# Patient Record
Sex: Male | Born: 1960 | Race: White | Hispanic: No | Marital: Married | Smoking: Current every day smoker
Health system: Southern US, Community
[De-identification: ages and names within clinical notes are randomized; demographics above are authoritative.]

## PROBLEM LIST (undated history)

## (undated) DIAGNOSIS — J449 Chronic obstructive pulmonary disease, unspecified: Secondary | ICD-10-CM

## (undated) DIAGNOSIS — Z720 Tobacco use: Secondary | ICD-10-CM

## (undated) DIAGNOSIS — G8929 Other chronic pain: Secondary | ICD-10-CM

## (undated) DIAGNOSIS — I10 Essential (primary) hypertension: Secondary | ICD-10-CM

## (undated) DIAGNOSIS — M549 Dorsalgia, unspecified: Secondary | ICD-10-CM

## (undated) DIAGNOSIS — J9383 Other pneumothorax: Secondary | ICD-10-CM

## (undated) DIAGNOSIS — K089 Disorder of teeth and supporting structures, unspecified: Secondary | ICD-10-CM

## (undated) DIAGNOSIS — F1410 Cocaine abuse, uncomplicated: Secondary | ICD-10-CM

## (undated) HISTORY — PX: NO PAST SURGERIES: SHX2092

---

## 2012-03-24 DIAGNOSIS — Z720 Tobacco use: Secondary | ICD-10-CM

## 2012-03-24 HISTORY — DX: Tobacco use: Z72.0

## 2012-09-27 DIAGNOSIS — K089 Disorder of teeth and supporting structures, unspecified: Secondary | ICD-10-CM

## 2012-09-27 HISTORY — DX: Disorder of teeth and supporting structures, unspecified: K08.9

## 2012-11-21 DIAGNOSIS — E785 Hyperlipidemia, unspecified: Secondary | ICD-10-CM | POA: Insufficient documentation

## 2012-11-21 DIAGNOSIS — I10 Essential (primary) hypertension: Secondary | ICD-10-CM | POA: Diagnosis present

## 2013-01-24 DIAGNOSIS — R739 Hyperglycemia, unspecified: Secondary | ICD-10-CM | POA: Insufficient documentation

## 2013-01-27 DIAGNOSIS — J449 Chronic obstructive pulmonary disease, unspecified: Secondary | ICD-10-CM | POA: Diagnosis present

## 2013-09-12 DIAGNOSIS — M545 Low back pain: Secondary | ICD-10-CM

## 2013-09-12 DIAGNOSIS — G8929 Other chronic pain: Secondary | ICD-10-CM | POA: Insufficient documentation

## 2013-09-12 DIAGNOSIS — M5417 Radiculopathy, lumbosacral region: Secondary | ICD-10-CM | POA: Insufficient documentation

## 2014-01-08 ENCOUNTER — Emergency Department (HOSPITAL_COMMUNITY)
Admission: EM | Admit: 2014-01-08 | Discharge: 2014-01-08 | Disposition: A | Discharge status: Home / Self Care | Payer: Medicare PPO | Attending: Emergency Medicine | Admitting: Emergency Medicine

## 2014-01-08 ENCOUNTER — Emergency Department (HOSPITAL_COMMUNITY): Payer: Medicare PPO

## 2014-01-08 ENCOUNTER — Encounter (HOSPITAL_COMMUNITY): Payer: Self-pay | Admitting: Emergency Medicine

## 2014-01-08 DIAGNOSIS — Y9389 Activity, other specified: Secondary | ICD-10-CM | POA: Diagnosis not present

## 2014-01-08 DIAGNOSIS — F17200 Nicotine dependence, unspecified, uncomplicated: Secondary | ICD-10-CM | POA: Diagnosis not present

## 2014-01-08 DIAGNOSIS — S8990XA Unspecified injury of unspecified lower leg, initial encounter: Secondary | ICD-10-CM | POA: Diagnosis present

## 2014-01-08 DIAGNOSIS — Y9289 Other specified places as the place of occurrence of the external cause: Secondary | ICD-10-CM | POA: Insufficient documentation

## 2014-01-08 DIAGNOSIS — I10 Essential (primary) hypertension: Secondary | ICD-10-CM | POA: Diagnosis not present

## 2014-01-08 DIAGNOSIS — X500XXA Overexertion from strenuous movement or load, initial encounter: Secondary | ICD-10-CM | POA: Diagnosis not present

## 2014-01-08 DIAGNOSIS — S99919A Unspecified injury of unspecified ankle, initial encounter: Secondary | ICD-10-CM | POA: Diagnosis present

## 2014-01-08 DIAGNOSIS — S93409A Sprain of unspecified ligament of unspecified ankle, initial encounter: Secondary | ICD-10-CM | POA: Diagnosis not present

## 2014-01-08 DIAGNOSIS — S99929A Unspecified injury of unspecified foot, initial encounter: Secondary | ICD-10-CM

## 2014-01-08 DIAGNOSIS — S93402A Sprain of unspecified ligament of left ankle, initial encounter: Secondary | ICD-10-CM

## 2014-01-08 HISTORY — DX: Essential (primary) hypertension: I10

## 2014-01-08 MED ORDER — OXYCODONE-ACETAMINOPHEN 5-325 MG PO TABS
1.0000 | ORAL_TABLET | Freq: Once | ORAL | Status: AC
  Administered 2014-01-08: 1 via ORAL
  Filled 2014-01-08: qty 1

## 2014-01-08 MED ORDER — IBUPROFEN 600 MG PO TABS: 600.0000 mg | ORAL_TABLET | Freq: Four times a day (QID) | ORAL | Status: DC | PRN

## 2014-01-08 MED ORDER — OXYCODONE-ACETAMINOPHEN 5-325 MG PO TABS: 1.0000 | ORAL_TABLET | Freq: Four times a day (QID) | ORAL | Status: DC | PRN

## 2014-01-08 NOTE — ED Notes (Signed)
Pt twisted his left ankle about 12:30 last night

## 2014-01-08 NOTE — ED Provider Notes (Signed)
CSN: [id number]     Arrival date & time 01/08/14  0346 History   First MD Initiated Contact with Patient 01/08/14 [phone]     Chief Complaint  Patient presents with  . Ankle Pain     (Consider location/radiation/quality/duration/timing/severity/associated sxs/prior Treatment) HPI  This a 53 year old male with history of hypertension who presents with left ankle pain. Patient reports that earlier this evening, he rolled his left ankle when stepping out of his vehicle.  He reports 10 out of 10 pain. He has not taken anything for the pain. He denies any other injury.  Past Medical History  Diagnosis Date  . Hypertension    History reviewed. No pertinent past surgical history. History reviewed. No pertinent family history. History  Substance Use Topics  . Smoking status: Current Every Day Smoker  . Smokeless tobacco: Not on file  . Alcohol Use: No    Review of Systems  Musculoskeletal:       Left ankle pain  Neurological: Negative for weakness and numbness.  All other systems reviewed and are negative.     Allergies  Tylenol  Home Medications   Prior to Admission medications   Medication Sig Start Date End Date Taking? Authorizing Provider  ibuprofen (ADVIL,MOTRIN) 600 MG tablet Take 1 tablet (600 mg total) by mouth every 6 (six) hours as needed. 01/08/14   Merryl Hacker, MD  oxyCODONE-acetaminophen (PERCOCET/ROXICET) 5-325 MG per tablet Take 1 tablet by mouth every 6 (six) hours as needed for severe pain. 01/08/14   Merryl Hacker, MD   BP 168/103  Pulse 85  Temp(Src) 98.2 F (36.8 C) (Oral)  Resp 18  Ht 5\' 7"  (1.702 m)  Wt 180 lb (81.647 kg)  BMI 28.19 kg/m2  SpO2 96% Physical Exam  Nursing note and vitals reviewed. Constitutional: He is oriented to person, place, and time.  Appears older than stated age  HENT:  Head: Normocephalic and atraumatic.  Cardiovascular: Normal rate and regular rhythm.   Pulmonary/Chest: Effort normal. No respiratory distress.   Musculoskeletal: He exhibits no edema.  Focused examination of the left ankle reveals swelling about the ankle with tenderness to palpation over the lateral malleolus, no midfoot tenderness,  no proximal fibular tenderness, 2+ DP pulse  Neurological: He is alert and oriented to person, place, and time.  Skin: Skin is warm and dry.  Psychiatric: He has a normal mood and affect.    ED Course  Procedures (including critical care time) Labs Review Labs Reviewed - No data to display  Imaging Review Dg Ankle Complete Left  01/08/2014   CLINICAL DATA:  Twisting injury to left ankle, with ankle swelling and pain.  EXAM: LEFT ANKLE COMPLETE - 3+ VIEW  COMPARISON:  None.  FINDINGS: There is no evidence of fracture or dislocation. The ankle mortise is intact; the interosseous space is within normal limits. No talar tilt or subluxation is seen. A small posterior calcaneal spur is seen.  The joint spaces are preserved. Lateral soft tissue swelling is noted.  IMPRESSION: No evidence of fracture or dislocation.   Electronically Signed   By: Garald Balding M.D.   On: 01/08/2014 04:25     EKG Interpretation None      MDM   Final diagnoses:  Ankle sprain, left, initial encounter    Patient presents with left ankle pain. He was given Percocet for pain management. Ankle films negative for fracture or dislocation. Discuss with patient and his family rest, ice, compression, and elevation. He will be  discharged home with pain medication and was encouraged to take ibuprofen as well.  After history, exam, and medical workup I feel the patient has been appropriately medically screened and is safe for discharge home. Pertinent diagnoses were discussed with the patient. Patient was given return precautions.     Merryl Hacker, MD 01/08/14 [phone]

## 2014-01-08 NOTE — Discharge Instructions (Signed)

## 2014-04-23 ENCOUNTER — Emergency Department (HOSPITAL_COMMUNITY)
Admission: EM | Admit: 2014-04-23 | Discharge: 2014-04-23 | Disposition: A | Discharge status: Home / Self Care | Payer: Medicare PPO | Attending: Emergency Medicine | Admitting: Emergency Medicine

## 2014-04-23 ENCOUNTER — Emergency Department (HOSPITAL_COMMUNITY): Payer: Medicare PPO

## 2014-04-23 ENCOUNTER — Encounter (HOSPITAL_COMMUNITY): Payer: Self-pay | Admitting: Emergency Medicine

## 2014-04-23 DIAGNOSIS — I10 Essential (primary) hypertension: Secondary | ICD-10-CM | POA: Diagnosis not present

## 2014-04-23 DIAGNOSIS — J449 Chronic obstructive pulmonary disease, unspecified: Secondary | ICD-10-CM | POA: Diagnosis not present

## 2014-04-23 DIAGNOSIS — Z720 Tobacco use: Secondary | ICD-10-CM | POA: Insufficient documentation

## 2014-04-23 DIAGNOSIS — G8929 Other chronic pain: Secondary | ICD-10-CM | POA: Insufficient documentation

## 2014-04-23 DIAGNOSIS — R52 Pain, unspecified: Secondary | ICD-10-CM

## 2014-04-23 DIAGNOSIS — M549 Dorsalgia, unspecified: Secondary | ICD-10-CM

## 2014-04-23 DIAGNOSIS — M545 Low back pain: Secondary | ICD-10-CM | POA: Diagnosis present

## 2014-04-23 HISTORY — DX: Chronic obstructive pulmonary disease, unspecified: J44.9

## 2014-04-23 HISTORY — DX: Dorsalgia, unspecified: M54.9

## 2014-04-23 HISTORY — DX: Other chronic pain: G89.29

## 2014-04-23 NOTE — ED Notes (Signed)
Pt states he has a hx of back pain that has worsened since last night. Alert and oriented.

## 2014-04-23 NOTE — Discharge Instructions (Signed)
You were evaluated in the ED today for your back pain. There were no acute findings that suggest an emergent cause for your back pain. You will need to follow-up with your primary care provider for further evaluation and management of your chronic back pain. Please return to ED if you begin to experience numbness, weakness, loss of bowel or bladder function

## 2014-04-23 NOTE — ED Provider Notes (Signed)
CSN: [id number]     Arrival date & time 04/23/14  1632 History  This chart was scribed for non-physician practitioner, Comer Locket, PA-C working with Artis Delay, MD by Frederich Balding, ED scribe. This patient was seen in room WTR8/WTR8 and the patient's care was started at 5:03 PM.    Chief Complaint  Patient presents with  . Back Pain   The history is provided by the patient. No language interpreter was used.    HPI Comments: Benjamin Burke is a 53 y.o. male who presents to the Emergency Department complaining of chronic lower back pain that worsened last night. Describes the pain as stabbing and constant. Denies injury but states he did heavy lifting a few days before pain started. Ambulation and standing still worsens the pain. Pain is not as severe at rest and is only exacerbated with walking and standing. Pt see pain management and has taken his daily percocet with no relief. He has also used a back brace with no relief. Denies abdominal pain, bowel or bladder incontinence, numbness or weakness. Denies history of cancer or IV drug use.   Past Medical History  Diagnosis Date  . Hypertension   . COPD (chronic obstructive pulmonary disease)   . Chronic back pain    History reviewed. No pertinent past surgical history. History reviewed. No pertinent family history. History  Substance Use Topics  . Smoking status: Current Every Day Smoker  . Smokeless tobacco: Not on file  . Alcohol Use: No    Review of Systems  Gastrointestinal: Negative for abdominal pain.  Genitourinary:       Negative for bowel or bladder incontinence.  Musculoskeletal: Positive for back pain.  Neurological: Negative for weakness and numbness.  All other systems reviewed and are negative.  Allergies  Tylenol  Home Medications   Prior to Admission medications   Medication Sig Start Date End Date Taking? Authorizing Provider  ibuprofen (ADVIL,MOTRIN) 600 MG tablet Take 1 tablet (600 mg total) by  mouth every 6 (six) hours as needed. 01/08/14   Merryl Hacker, MD  oxyCODONE-acetaminophen (PERCOCET/ROXICET) 5-325 MG per tablet Take 1 tablet by mouth every 6 (six) hours as needed for severe pain. 01/08/14   Merryl Hacker, MD   BP 162/95 mmHg  Pulse 79  Temp(Src) 97.5 F (36.4 C) (Oral)  SpO2 97%   Physical Exam  Constitutional: He is oriented to person, place, and time. He appears well-developed and well-nourished. No distress.  HENT:  Head: Normocephalic and atraumatic.  Eyes: Conjunctivae and EOM are normal.  Neck: Neck supple. No tracheal deviation present.  Cardiovascular: Normal rate, regular rhythm and normal heart sounds.   Pulmonary/Chest: Effort normal and breath sounds normal. No respiratory distress. He has no wheezes. He has no rales.  Abdominal: Soft. There is no tenderness.  Musculoskeletal: Normal range of motion.  Pain is reproducible with walking. No midline body tenderness. No other obvious deformities appreciated. No skin lesions or eruptions.   Neurological: He is alert and oriented to person, place, and time.  Gait appears baseline without ataxia but is somewhat antalgic. Neurovascularly intact.  Skin: Skin is warm and dry.  Psychiatric: He has a normal mood and affect. His behavior is normal.  Nursing note and vitals reviewed.   ED Course  Procedures (including critical care time)  DIAGNOSTIC STUDIES: Oxygen Saturation is 97% on RA, normal by my interpretation.    COORDINATION OF CARE: 5:07 PM-Discussed treatment plan which includes lumbar xray with pt at bedside  and pt agreed to plan.   Labs Review Labs Reviewed - No data to display  Imaging Review Dg Lumbar Spine 2-3 Views  04/23/2014   CLINICAL DATA:  Chronic low back pain. Left-sided pain for 2 days. No injury. Initial encounter.  EXAM: LUMBAR SPINE - 2-3 VIEW  COMPARISON:  None.  FINDINGS: Five lumbar type vertebral bodies. Sacroiliac joints are symmetric. Maintenance of vertebral body  height. Mild straightening of expected lordosis. Mild endplate osteophytes, including at L3-4. Intervertebral disc heights are maintained. Aortic atherosclerosis.  IMPRESSION: No acute osseous abnormality. Nonspecific straightening of expected lumbar lordosis.  Atherosclerosis.   Electronically Signed   By: Abigail Miyamoto M.D.   On: 04/23/2014 17:52   Dg Pelvis 1-2 Views  04/23/2014   CLINICAL DATA:  Chronic low back pain. LEFT-sided low back pain for 1 and half days. Initial encounter.  EXAM: PELVIS - 1-2 VIEW  COMPARISON:  None.  FINDINGS: There is no evidence of pelvic fracture or diastasis. No pelvic bone lesions are seen.  IMPRESSION: Negative.   Electronically Signed   By: Dereck Ligas M.D.   On: 04/23/2014 17:51     EKG Interpretation None      MDM  Vitals stable - WNL -afebrile Pt resting comfortably in ED. PE--not concerning further acute or emergent pathology. Patient ambulates independently without any appreciable ataxia. This appears to be chronic back pain that he is already followed by his primary care as well as pain management. There are no acute objective findings during today's visit. Patient denies any numbness, weakness, loss of bowel or bladder or any other sign of cauda equina or other emergent pathology. Patient denies discomfort at rest, pain exacerbated with walking, likely musculoskeletal. Imaging--x-ray of lumbar and pelvis show no acute osseous abnormalities.  Discussed f/u with PCP and return precautions, pt very amenable to plan. Patient stable, in good condition and is appropriate for discharge   Final diagnoses:  Back pain      I personally performed the services described in this documentation, which was scribed in my presence. The recorded information has been reviewed and is accurate.  Viona Gilmore Ripley, PA-C 04/24/14 [place], MD 04/25/14 [phone]

## 2014-04-24 ENCOUNTER — Emergency Department (HOSPITAL_COMMUNITY)
Admission: EM | Admit: 2014-04-24 | Discharge: 2014-04-24 | Disposition: A | Discharge status: Home / Self Care | Payer: Medicare PPO | Attending: Emergency Medicine | Admitting: Emergency Medicine

## 2014-04-24 ENCOUNTER — Encounter (HOSPITAL_COMMUNITY): Payer: Self-pay | Admitting: Emergency Medicine

## 2014-04-24 DIAGNOSIS — Z720 Tobacco use: Secondary | ICD-10-CM | POA: Diagnosis not present

## 2014-04-24 DIAGNOSIS — J449 Chronic obstructive pulmonary disease, unspecified: Secondary | ICD-10-CM | POA: Diagnosis not present

## 2014-04-24 DIAGNOSIS — G8929 Other chronic pain: Secondary | ICD-10-CM | POA: Diagnosis not present

## 2014-04-24 DIAGNOSIS — I10 Essential (primary) hypertension: Secondary | ICD-10-CM | POA: Insufficient documentation

## 2014-04-24 DIAGNOSIS — Z79899 Other long term (current) drug therapy: Secondary | ICD-10-CM | POA: Insufficient documentation

## 2014-04-24 DIAGNOSIS — S39012D Strain of muscle, fascia and tendon of lower back, subsequent encounter: Secondary | ICD-10-CM | POA: Diagnosis not present

## 2014-04-24 DIAGNOSIS — X58XXXD Exposure to other specified factors, subsequent encounter: Secondary | ICD-10-CM | POA: Diagnosis not present

## 2014-04-24 DIAGNOSIS — M545 Low back pain: Secondary | ICD-10-CM | POA: Diagnosis present

## 2014-04-24 MED ORDER — PREDNISONE 50 MG PO TABS: 50.0000 mg | ORAL_TABLET | Freq: Every day | ORAL | Status: DC

## 2014-04-24 MED ORDER — HYDROCODONE-IBUPROFEN 7.5-200 MG PO TABS: 1.0000 | ORAL_TABLET | Freq: Three times a day (TID) | ORAL | Status: DC | PRN

## 2014-04-24 MED ORDER — KETOROLAC TROMETHAMINE 60 MG/2ML IM SOLN
60.0000 mg | Freq: Once | INTRAMUSCULAR | Status: AC
  Administered 2014-04-24: 60 mg via INTRAMUSCULAR
  Filled 2014-04-24: qty 2

## 2014-04-24 MED ORDER — DEXAMETHASONE SODIUM PHOSPHATE 10 MG/ML IJ SOLN
10.0000 mg | Freq: Once | INTRAMUSCULAR | Status: AC
  Administered 2014-04-24: 10 mg via INTRAMUSCULAR
  Filled 2014-04-24: qty 1

## 2014-04-24 MED ORDER — HYDROMORPHONE HCL 1 MG/ML IJ SOLN
1.0000 mg | Freq: Once | INTRAMUSCULAR | Status: DC
Start: 2014-04-24 — End: 2014-04-24
  Filled 2014-04-24: qty 1

## 2014-04-24 NOTE — ED Notes (Signed)
Pt alert, arrives via EMS, c/o cont low back pain, seen in ED yesterday with similar c/o, resp even unlabored, skin pwd, ambulates to triage

## 2014-04-24 NOTE — Discharge Instructions (Signed)
Follow up with your primary care doctor. Return here as needed. Use ice and heat on the area.

## 2014-04-24 NOTE — ED Provider Notes (Signed)
CSN: [id number]     Arrival date & time 04/24/14  1346 History   First MD Initiated Contact with Patient 04/24/14 1502     Chief Complaint  Patient presents with  . Back Pain     HPI: Benjamin Burke is a 53 year old Caucasian male with PMH of HTN, HLD, and Chronic Back Pain who presents to the ED on 04/24/2014 with worsening left lower back pain since Sunday evening. He denies lifting any heavy objects recently and says he is unsure of what caused the exacerbation of his pain. He says the pain is "sharp" in nature, and is a 9/10 with standing or walking and a 2-3/10 when he is lying down. On Sunday, he took his prescribed daily Percocet and did not get any relief in his pain, so he took another dose 1 hour later and says the pain still did not subside. He has tried ice as well, but says that does not provide any relief.  Patient reports he was evaluated in the ED yesterday, but his pain persisted once he was home. Lumbar Spine and Pelvic X-rays performed on 04/23/2014 were negative for any acute findings. The patient has not been evaluated by his PCP since the pain started on Sunday, due to not being able to drive due to the pain.   Past Medical History  Diagnosis Date  . Hypertension   . COPD (chronic obstructive pulmonary disease)   . Chronic back pain    History reviewed. No pertinent past surgical history. No family history on file. History  Substance Use Topics  . Smoking status: Current Every Day Smoker  . Smokeless tobacco: Not on file  . Alcohol Use: No    Review of Systems  Constitutional: Negative for fever and chills.  Eyes: Negative for visual disturbance.  Respiratory: Negative for chest tightness and shortness of breath.   Cardiovascular: Negative for chest pain.  Gastrointestinal: Negative for nausea and vomiting.  Endocrine: Negative for polyuria.  Genitourinary: Negative for frequency and flank pain.  Musculoskeletal: Positive for back pain. Negative for gait  problem, neck pain and neck stiffness.  Skin: Negative for rash.  Allergic/Immunologic: Negative for immunocompromised state.  Neurological: Negative for tremors, syncope, weakness and numbness.  Psychiatric/Behavioral: Negative for confusion.  : All other systems negative except as documented in the HPI. All pertinent positives and negatives as reviewed in the HPI.   Allergies  Tylenol  Home Medications   Prior to Admission medications   Medication Sig Start Date End Date Taking? Authorizing Provider  lisinopril (PRINIVIL,ZESTRIL) 10 MG tablet Take 10 mg by mouth daily.   Yes Historical Provider, MD  oxyCODONE-acetaminophen (PERCOCET/ROXICET) 5-325 MG per tablet Take 1 tablet by mouth every 6 (six) hours as needed for severe pain. 01/08/14  Yes Merryl Hacker, MD  ibuprofen (ADVIL,MOTRIN) 600 MG tablet Take 1 tablet (600 mg total) by mouth every 6 (six) hours as needed. 01/08/14   Merryl Hacker, MD   BP 147/116 mmHg  Pulse 70  Temp(Src) 98 F (36.7 C) (Oral)  Resp 16  Wt 180 lb (81.647 kg)  SpO2 99% Physical Exam  Constitutional: He is oriented to person, place, and time. He appears well-developed and well-nourished. No distress.  HENT:  Head: Normocephalic and atraumatic.  Eyes: Conjunctivae are normal. Pupils are equal, round, and reactive to light.  Neck: Normal range of motion. Neck supple. No tracheal deviation present.  Cardiovascular: Normal rate, regular rhythm, normal heart sounds and intact distal pulses.  Exam reveals no gallop and no friction rub.   No murmur heard. Pulmonary/Chest: Effort normal and breath sounds normal. No stridor. No respiratory distress. He has no wheezes. He has no rales. He exhibits no tenderness.  Abdominal: Soft. Bowel sounds are normal. He exhibits no distension and no mass. There is no tenderness. There is no rebound and no guarding.  Musculoskeletal: Normal range of motion. He exhibits tenderness. He exhibits no edema.  Small area  along left lower back that is tender to deep palpation. Limited extension of lumbar spine, with normal flexion. Normal flexion and extension of cervical spine. Negative Straight Leg Raise bilaterally.  Lymphadenopathy:    He has no cervical adenopathy.  Neurological: He is alert and oriented to person, place, and time. He has normal reflexes. He displays normal reflexes. He exhibits normal muscle tone. Coordination normal.  Muscular strength of lower extremities 5/5 bilaterally. Normal gait.  Skin: Skin is warm and dry. No rash noted. He is not diaphoretic. No erythema. No pallor.  Nursing note and vitals reviewed.   ED Course  Procedures (including critical care time) Labs Review Labs Reviewed - No data to display  Imaging Review Dg Lumbar Spine 2-3 Views  04/23/2014   CLINICAL DATA:  Chronic low back pain. Left-sided pain for 2 days. No injury. Initial encounter.  EXAM: LUMBAR SPINE - 2-3 VIEW  COMPARISON:  None.  FINDINGS: Five lumbar type vertebral bodies. Sacroiliac joints are symmetric. Maintenance of vertebral body height. Mild straightening of expected lordosis. Mild endplate osteophytes, including at L3-4. Intervertebral disc heights are maintained. Aortic atherosclerosis.  IMPRESSION: No acute osseous abnormality. Nonspecific straightening of expected lumbar lordosis.  Atherosclerosis.   Electronically Signed   By: Abigail Miyamoto M.D.   On: 04/23/2014 17:52   Dg Pelvis 1-2 Views  04/23/2014   CLINICAL DATA:  Chronic low back pain. LEFT-sided low back pain for 1 and half days. Initial encounter.  EXAM: PELVIS - 1-2 VIEW  COMPARISON:  None.  FINDINGS: There is no evidence of pelvic fracture or diastasis. No pelvic bone lesions are seen.  IMPRESSION: Negative.   Electronically Signed   By: Dereck Ligas M.D.   On: 04/23/2014 17:51    The patient has pain over the L SI joint laterally. There is no midline tenderness. The patient was seen and evaluated with x-rays yesterday. The patient  has no urinary symptoms with normal reflexes and motor function in his lower extremities. The patient was also evaluated by Dr. Sabra Heck. The patient is advised to return here as needed. He is also advised to use ice and heat on his lower lateral L back. The patient agrees to the plan and all questions were answered.     Brent General, PA-C 04/24/14 1546  Johnna Acosta, MD 04/25/14 [phone]

## 2014-04-24 NOTE — ED Provider Notes (Signed)
53 year old male with several days of pain in his left SI joint area, this is mild when he is resting, worse when he stands or changes position and is not associated with any pathologic red flags for back pain. On exam the patient has reproducible tenderness directly over that area, there is pain with standing up, pain is completely relieved with lying in a supine position on his side. No radiculopathy, medications will be given, the patient will be discharged with NSAIDs to follow-up with his primary doctor, he is aware of the plan and is in agreement.  Medical screening examination/treatment/procedure(s) were conducted as a shared visit with non-physician practitioner(s) and myself.  I personally evaluated the patient during the encounter.  Clinical Impression:   Final diagnoses:  Lumbar strain, subsequent encounter         Johnna Acosta, MD 04/25/14 2355

## 2014-11-28 ENCOUNTER — Encounter: Payer: Self-pay | Admitting: Anesthesiology

## 2014-11-28 ENCOUNTER — Ambulatory Visit: Payer: Medicare PPO | Attending: Anesthesiology | Admitting: Anesthesiology

## 2014-11-28 VITALS — BP 155/101 | HR 78 | Temp 97.8°F | Resp 18 | Ht 66.0 in | Wt 169.0 lb

## 2014-11-28 DIAGNOSIS — F17200 Nicotine dependence, unspecified, uncomplicated: Secondary | ICD-10-CM | POA: Insufficient documentation

## 2014-11-28 DIAGNOSIS — G8929 Other chronic pain: Secondary | ICD-10-CM | POA: Diagnosis present

## 2014-11-28 DIAGNOSIS — M545 Low back pain, unspecified: Secondary | ICD-10-CM

## 2014-11-28 DIAGNOSIS — M47816 Spondylosis without myelopathy or radiculopathy, lumbar region: Secondary | ICD-10-CM

## 2014-11-28 DIAGNOSIS — I10 Essential (primary) hypertension: Secondary | ICD-10-CM | POA: Insufficient documentation

## 2014-11-28 DIAGNOSIS — M5136 Other intervertebral disc degeneration, lumbar region: Secondary | ICD-10-CM | POA: Insufficient documentation

## 2014-11-28 DIAGNOSIS — M5137 Other intervertebral disc degeneration, lumbosacral region: Secondary | ICD-10-CM

## 2014-11-28 MED ORDER — TRAMADOL HCL 50 MG PO TABS: 50.0000 mg | ORAL_TABLET | Freq: Three times a day (TID) | ORAL | Status: DC

## 2014-11-28 NOTE — Progress Notes (Signed)
Safety precautions to be maintained throughout the outpatient stay will include: orient to surroundings, keep bed in low position, maintain call bell within reach at all times, provide assistance with transfer out of bed and ambulation.  

## 2014-11-28 NOTE — Progress Notes (Signed)
Subjective:    Patient ID: Benjamin Burke, male    DOB: [birth year], 54 y.o.   MRN: [id number]  HPI  This patient is a 54 year old white male who presents to [place] for chronic low back pain. Vision indicates that he has had chronic low back pain for the past 4-5 years. He indicated that he fell on ice several years ago and his pain has been quite severe since that time. His subjective pain intensity rating is 7 on a scale of 0-10. Vision indicated that he was treated in another clinic and was given oxycodone which he took 4 tablets a day.  Patient indicated that the medications help him but that he has been off medications for at least 3 months. He also indicates that he is quite active in that he looks after his 66 year old mother and his uncle. He indicates also that he is busy keeping up with his 32 year old son. He indicated that many years ago he has had some injections to his back but they did not help him at all.. Current medications: Patient indicates that he takes antihypertensive medications but he could not remember the name of those medicines and he also takes aspirin 81 mg.  Allergies he is allergic to Tylenol and Tylenol makes him develop nausea and vomiting  Past medical history: Past medical history is positive for hypertension for which he takes medications.  Past surgical history: Negative patient has not had any surgery in the past.  Social and economic history. The patient indicates that he is not married but that he has 2 sons ages 80 and 53 and they are both alive and well. Patient indicates that he smokes one half packs of cigarettes per day and he's been doing this since age 31 He does not use alcohol and he does not use illicit drugs  Legal history: Patient indicates that he is never had any difficulties with the law.  Family history. As indicates that his mother is alive and well and that she is 60. Case that she has diabetes hypertension and takes multiple  medications. His father is deceased at age 18 years old and he died from cancer of the kidney and bladder. He has 3 brothers who are alive and well and 2 sisters who are both alive and well.   Review of Systems  Constitutional: Negative.   HENT: Negative.   Eyes: Negative.   Respiratory: Negative.   Cardiovascular: Negative.   Gastrointestinal: Negative.   Endocrine: Negative.   Genitourinary: Negative.   Allergic/Immunologic:       Patient is allergic to Tylenol  Neurological: Negative.   Hematological: Negative.   Psychiatric/Behavioral: Negative.        Objective:   Physical Exam  Constitutional: He is oriented to person, place, and time. He appears well-developed.  HENT:  Head: Normocephalic.  Eyes: Lids are normal. Pupils are equal, round, and reactive to light. Lids are everted and swept, no foreign bodies found.  Neck: Normal range of motion.  Cardiovascular: Normal rate, regular rhythm, normal heart sounds and intact distal pulses.   Pulmonary/Chest: Effort normal.  Abdominal: Soft. Normal appearance and bowel sounds are normal.  Musculoskeletal: Normal range of motion.       Lumbar back: He exhibits tenderness.       Back:  Lymphadenopathy:  There was no lymphadenopathy  Neurological: He is alert and oriented to person, place, and time. He has normal reflexes. He displays normal reflexes. No cranial nerve deficit or sensory deficit.  He exhibits normal muscle tone. Coordination normal.  Skin: Skin is warm, dry and intact. No cyanosis. Nails show no clubbing.  Psychiatric: He has a normal mood and affect. His speech is normal and behavior is normal. Thought content normal.    This patient is a pleasant delightful 54 year old gentleman who presents with chronic low back pain. He looks older than his stated age He walks without a limp and does not appear to be in any distress. His blood pressure is 155/101 and his pulse is 78 bpm. He is afebrile and his weight is  169 pounds Appears to be in no distress. Head and neck appears normal but he has poor dentition. There is no neck stiffness. Pupils are equal and regular and reacts normally to light. Chest is clinically clear and there are no adventitious sounds Abdomen is soft and nontender and there are no palpable organomegaly Reflexes weren't normal Reacting to light touch and pinprick were normal Straight-leg raising test was abnormal On the left side it was 40 and on the right-sided was 50 Torsion test was positive indicative of facetogenic disease There was tenderness in the low back area particularly in the region of L4 on L5 There were no other positive physical findings.      Assessment & Plan:  1) Chronic Low Back pain 2) Lumbar DDD 3) Lumbar Facet Arthropathy 4) S/P Hypertension.  Plan of management 1 Will begin the patient on tramadol 50 mg 3 times a day after meals and give him a three-week supply with 2 refills 2 plan to perform a caudal epidural steroid injection for him in the future 3 Will ask him to return for follow-up evaluation in 3 weeks. 4. I have asked the patient to follow up with his primary care physician so that his blood pressure can be better controlled I spent some time discussing with the patient the need to refrain from using opioids at this time I suggested to him that I would initially treated him with tramadol and will bring him back after his July 4 for patient to perform a caudal epidural steroid injection for him He appeared to accept and understand this plan of action. Plan to see him in the next 3 weeks.

## 2014-12-19 ENCOUNTER — Ambulatory Visit: Payer: Medicare PPO | Admitting: Anesthesiology

## 2015-07-03 DIAGNOSIS — Z139 Encounter for screening, unspecified: Secondary | ICD-10-CM

## 2016-01-14 NOTE — Congregational Nurse Program (Signed)
Congregational Nurse Program Note  Date of Encounter: 07/03/2015  Past Medical History: Past Medical History:  Diagnosis Date  . Chronic back pain   . COPD (chronic obstructive pulmonary disease)   . Hypertension     Encounter Details:  Client was seen at the [facility] seeking . assistance for an eye exam/glasses.  . Client was assisted with an appointment with the [facility]. Instructed client to monitor BP and make an appointment with PCP. Educated client importance of having regular checkups with PCP to prevent health Issues.  Benjamin Burke.

## 2016-01-21 NOTE — Congregational Nurse Program (Signed)
Congregational Nurse Program Note  Date of Encounter: 07/03/2015  Past Medical History: Past Medical History:  Diagnosis Date  . Chronic back pain   . COPD (chronic obstructive pulmonary disease)   . Hypertension     Encounter Details:  Client was assisted with appointment through the [facility] for an eye exam/glasses. Client has a primary physician in another county and was advised to make appointment as soon as possible to assess hypertension, abnormal bp today. Informed client importance of regular checkups to monitor medical issues.  Tarri Fuller CNP  [phone].

## 2016-04-28 DIAGNOSIS — F17210 Nicotine dependence, cigarettes, uncomplicated: Secondary | ICD-10-CM | POA: Diagnosis present

## 2016-08-07 ENCOUNTER — Encounter (HOSPITAL_COMMUNITY): Payer: Self-pay

## 2016-08-07 ENCOUNTER — Inpatient Hospital Stay (HOSPITAL_COMMUNITY)
Admission: EM | Admit: 2016-08-07 | Discharge: 2016-08-14 | DRG: 201 | Disposition: A | Discharge status: Home / Self Care | Payer: Medicare HMO | Attending: Thoracic Surgery (Cardiothoracic Vascular Surgery) | Admitting: Thoracic Surgery (Cardiothoracic Vascular Surgery)

## 2016-08-07 ENCOUNTER — Inpatient Hospital Stay (HOSPITAL_COMMUNITY): Payer: Medicare HMO

## 2016-08-07 ENCOUNTER — Emergency Department (HOSPITAL_COMMUNITY): Payer: Medicare HMO

## 2016-08-07 DIAGNOSIS — M545 Low back pain: Secondary | ICD-10-CM | POA: Diagnosis present

## 2016-08-07 DIAGNOSIS — Z7982 Long term (current) use of aspirin: Secondary | ICD-10-CM

## 2016-08-07 DIAGNOSIS — F17210 Nicotine dependence, cigarettes, uncomplicated: Secondary | ICD-10-CM | POA: Diagnosis present

## 2016-08-07 DIAGNOSIS — J939 Pneumothorax, unspecified: Secondary | ICD-10-CM

## 2016-08-07 DIAGNOSIS — J9311 Primary spontaneous pneumothorax: Principal | ICD-10-CM | POA: Diagnosis present

## 2016-08-07 DIAGNOSIS — J410 Simple chronic bronchitis: Secondary | ICD-10-CM | POA: Diagnosis not present

## 2016-08-07 DIAGNOSIS — J209 Acute bronchitis, unspecified: Secondary | ICD-10-CM | POA: Diagnosis present

## 2016-08-07 DIAGNOSIS — F1410 Cocaine abuse, uncomplicated: Secondary | ICD-10-CM | POA: Diagnosis present

## 2016-08-07 DIAGNOSIS — J449 Chronic obstructive pulmonary disease, unspecified: Secondary | ICD-10-CM | POA: Diagnosis present

## 2016-08-07 DIAGNOSIS — Z9689 Presence of other specified functional implants: Secondary | ICD-10-CM

## 2016-08-07 DIAGNOSIS — E785 Hyperlipidemia, unspecified: Secondary | ICD-10-CM | POA: Diagnosis present

## 2016-08-07 DIAGNOSIS — Z720 Tobacco use: Secondary | ICD-10-CM | POA: Diagnosis present

## 2016-08-07 DIAGNOSIS — J9811 Atelectasis: Secondary | ICD-10-CM

## 2016-08-07 DIAGNOSIS — J9383 Other pneumothorax: Secondary | ICD-10-CM | POA: Diagnosis not present

## 2016-08-07 DIAGNOSIS — Z9119 Patient's noncompliance with other medical treatment and regimen: Secondary | ICD-10-CM | POA: Diagnosis not present

## 2016-08-07 DIAGNOSIS — I10 Essential (primary) hypertension: Secondary | ICD-10-CM | POA: Diagnosis present

## 2016-08-07 DIAGNOSIS — G8929 Other chronic pain: Secondary | ICD-10-CM | POA: Diagnosis present

## 2016-08-07 DIAGNOSIS — J40 Bronchitis, not specified as acute or chronic: Secondary | ICD-10-CM | POA: Diagnosis present

## 2016-08-07 DIAGNOSIS — Z886 Allergy status to analgesic agent status: Secondary | ICD-10-CM | POA: Diagnosis not present

## 2016-08-07 DIAGNOSIS — J95811 Postprocedural pneumothorax: Secondary | ICD-10-CM | POA: Diagnosis not present

## 2016-08-07 DIAGNOSIS — J439 Emphysema, unspecified: Secondary | ICD-10-CM | POA: Diagnosis present

## 2016-08-07 HISTORY — DX: Cocaine abuse, uncomplicated: F14.10

## 2016-08-07 HISTORY — DX: Tobacco use: Z72.0

## 2016-08-07 HISTORY — DX: Disorder of teeth and supporting structures, unspecified: K08.9

## 2016-08-07 HISTORY — DX: Other pneumothorax: J93.83

## 2016-08-07 LAB — BASIC METABOLIC PANEL
Anion gap: 10 (ref 5–15)
BUN: 7 mg/dL (ref 6–20)
CO2: 27 mmol/L (ref 22–32)
Calcium: 9.4 mg/dL (ref 8.9–10.3)
Chloride: 101 mmol/L (ref 101–111)
Creatinine, Ser: 1.01 mg/dL (ref 0.61–1.24)
GFR calc Af Amer: >60 mL/min (ref >60)
GFR calc non Af Amer: >60 mL/min (ref >60)
Glucose, Bld: 124 mg/dL — ABNORMAL HIGH (ref 65–99)
Potassium: 3.2 mmol/L — ABNORMAL LOW (ref 3.5–5.1)
Sodium: 138 mmol/L (ref 135–145)

## 2016-08-07 LAB — CBC
HCT: 45.2 % (ref 39.0–52.0)
Hemoglobin: 15.5 g/dL (ref 13.0–17.0)
MCH: 30.9 pg (ref 26.0–34.0)
MCHC: 34.3 g/dL (ref 30.0–36.0)
MCV: 90.2 fL (ref 78.0–100.0)
Platelets: 352 10*3/uL (ref 150–400)
RBC: 5.01 MIL/uL (ref 4.22–5.81)
RDW: 13.9 % (ref 11.5–15.5)
WBC: 12.1 10*3/uL — ABNORMAL HIGH (ref 4.0–10.5)

## 2016-08-07 LAB — I-STAT TROPONIN, ED: Troponin i, poc: 0 ng/mL (ref 0.00–0.08)

## 2016-08-07 MED ORDER — GUAIFENESIN ER 600 MG PO TB12
1200.0000 mg | ORAL_TABLET | Freq: Two times a day (BID) | ORAL | Status: DC
  Administered 2016-08-07 – 2016-08-14 (×14): 1200 mg via ORAL
  Filled 2016-08-07 (×14): qty 2

## 2016-08-07 MED ORDER — LIDOCAINE HCL (PF) 1 % IJ SOLN
INTRAMUSCULAR | Status: AC
  Administered 2016-08-07: 30 mL
  Filled 2016-08-07: qty 30

## 2016-08-07 MED ORDER — OXYCODONE-ACETAMINOPHEN 5-325 MG PO TABS: 2.0000 | ORAL_TABLET | Freq: Four times a day (QID) | ORAL | Status: DC | PRN

## 2016-08-07 MED ORDER — MIDAZOLAM HCL 2 MG/2ML IJ SOLN
INTRAMUSCULAR | Status: AC
  Filled 2016-08-07: qty 2

## 2016-08-07 MED ORDER — GUAIFENESIN-DM 100-10 MG/5ML PO SYRP: 5.0000 mL | ORAL_SOLUTION | ORAL | Status: DC | PRN

## 2016-08-07 MED ORDER — MIDAZOLAM HCL 2 MG/2ML IJ SOLN
2.0000 mg | Freq: Once | INTRAMUSCULAR | Status: AC
  Administered 2016-08-07: 2 mg via INTRAVENOUS
  Filled 2016-08-07: qty 2

## 2016-08-07 MED ORDER — MORPHINE SULFATE (PF) 4 MG/ML IV SOLN
2.0000 mg | Freq: Once | INTRAVENOUS | Status: AC
  Administered 2016-08-07: 2 mg via INTRAVENOUS

## 2016-08-07 MED ORDER — TRAMADOL HCL 50 MG PO TABS
100.0000 mg | ORAL_TABLET | Freq: Four times a day (QID) | ORAL | Status: DC | PRN
  Administered 2016-08-07: 100 mg via ORAL
  Filled 2016-08-07: qty 2

## 2016-08-07 MED ORDER — ACETAMINOPHEN 325 MG PO TABS: 650.0000 mg | ORAL_TABLET | Freq: Four times a day (QID) | ORAL | Status: DC | PRN

## 2016-08-07 MED ORDER — ASPIRIN EC 81 MG PO TBEC
81.0000 mg | DELAYED_RELEASE_TABLET | Freq: Every day | ORAL | Status: DC
Start: 2016-08-08 — End: 2016-08-14
  Administered 2016-08-08 – 2016-08-14 (×7): 81 mg via ORAL
  Filled 2016-08-07 (×7): qty 1

## 2016-08-07 MED ORDER — AZITHROMYCIN 250 MG PO TABS
250.0000 mg | ORAL_TABLET | Freq: Every day | ORAL | Status: AC
  Administered 2016-08-08 – 2016-08-11 (×4): 250 mg via ORAL
  Filled 2016-08-07 (×5): qty 1

## 2016-08-07 MED ORDER — MORPHINE SULFATE (PF) 4 MG/ML IV SOLN
2.0000 mg | Freq: Once | INTRAVENOUS | Status: AC
  Administered 2016-08-07: 2 mg via INTRAVENOUS
  Filled 2016-08-07: qty 1

## 2016-08-07 MED ORDER — ORAL CARE MOUTH RINSE
15.0000 mL | Freq: Two times a day (BID) | OROMUCOSAL | Status: DC
Start: 2016-08-07 — End: 2016-08-14
  Administered 2016-08-08 – 2016-08-14 (×8): 15 mL via OROMUCOSAL

## 2016-08-07 MED ORDER — MIDAZOLAM HCL 2 MG/2ML IJ SOLN
2.0000 mg | Freq: Once | INTRAMUSCULAR | Status: AC
  Administered 2016-08-07: 2 mg via INTRAVENOUS

## 2016-08-07 MED ORDER — LOSARTAN POTASSIUM 25 MG PO TABS: 25.0000 mg | ORAL_TABLET | Freq: Every day | ORAL | Status: DC

## 2016-08-07 MED ORDER — LOSARTAN POTASSIUM 25 MG PO TABS
25.0000 mg | ORAL_TABLET | Freq: Every day | ORAL | Status: DC
  Administered 2016-08-07: 25 mg via ORAL
  Filled 2016-08-07: qty 1

## 2016-08-07 MED ORDER — LIDOCAINE HCL (PF) 1 % IJ SOLN
INTRAMUSCULAR | Status: AC
  Filled 2016-08-07: qty 30

## 2016-08-07 MED ORDER — OXYCODONE HCL 5 MG PO TABS
10.0000 mg | ORAL_TABLET | ORAL | Status: DC | PRN
  Administered 2016-08-07 – 2016-08-14 (×37): 10 mg via ORAL
  Filled 2016-08-07 (×37): qty 2

## 2016-08-07 MED ORDER — LISINOPRIL 10 MG PO TABS: 10.0000 mg | ORAL_TABLET | Freq: Every day | ORAL | Status: DC

## 2016-08-07 MED ORDER — DOCUSATE SODIUM 100 MG PO CAPS
200.0000 mg | ORAL_CAPSULE | Freq: Every day | ORAL | Status: DC
  Administered 2016-08-08 – 2016-08-14 (×6): 200 mg via ORAL
  Filled 2016-08-07 (×7): qty 2

## 2016-08-07 MED ORDER — AZITHROMYCIN 250 MG PO TABS
500.0000 mg | ORAL_TABLET | Freq: Every day | ORAL | Status: AC
  Administered 2016-08-07: 500 mg via ORAL
  Filled 2016-08-07: qty 2

## 2016-08-07 MED ORDER — IPRATROPIUM-ALBUTEROL 0.5-2.5 (3) MG/3ML IN SOLN: 3.0000 mL | Freq: Four times a day (QID) | RESPIRATORY_TRACT | Status: DC | PRN

## 2016-08-07 MED ORDER — ATORVASTATIN CALCIUM 40 MG PO TABS
40.0000 mg | ORAL_TABLET | Freq: Every day | ORAL | Status: DC
  Administered 2016-08-08 – 2016-08-13 (×6): 40 mg via ORAL
  Filled 2016-08-07 (×6): qty 1

## 2016-08-07 NOTE — H&P (Addendum)
[address]       [place] [address]             [phone]          CARDIOTHORACIC SURGERY HISTORY AND PHYSICAL EXAM  PCP is ANDERSON,TERESA, FNP  Chief Complaint:  Shortness of breath and right sided chest pain  HPI:  Patient is a 56 year old male with history of COPD, long-standing tobacco abuse, hypertension, and hyperlipidemia who was in his usual state of health until this morning when shortly after he got up out of bed he developed sudden onset of right-sided chest discomfort associated with shortness of breath. Symptoms persisted, ultimately prompting him to present to the emergency department. Chest x-ray revealed moderate to large size right pneumothorax. Cardiothoracic surgical consultation was requested.  The patient is single and lives with his brother, father, and son in [place]. 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 He has a known history of long-standing tobacco abuse and COPD. Approximately one month ago he was treated as an outpatient with a course of oral antibiotics for presumed bronchitis. He states that this helped his cough for a period of time but over the last week he has had increased coughing paroxysms. The cough is nonproductive and not associated with fevers, chills, nor malaise. He has continued to smoke and reports that he typically smokes 1-1/2 packs of cigarettes daily. He has been non-compliant with COPD medications for several months.  He reports no significant chronic physical limitations other than that related to chronic pain.  He denies any previous history of spontaneous pneumothorax. He denies any recent history of trauma to the chest.    Past Medical History:  Diagnosis Date  . Chronic back pain   . COPD (chronic obstructive pulmonary disease) ([place])   . Hypertension   . Right spontaneous pneumothorax 08/07/2016    Past Surgical History:  Procedure Laterality Date  .  NO PAST SURGERIES      Family History  Problem Relation Age of Onset  . Arthritis Mother   . Diabetes Mother   . Hyperlipidemia Mother   . Hypertension Mother   . Stroke Mother   . Kidney disease Mother   . Cancer Father     Social History   Social History  . Marital status: Married    Spouse name: N/A  . Number of children: N/A  . Years of education: N/A   Occupational History  . Not on file.   Social History Main Topics  . Smoking status: Current Every Day Smoker    Packs/day: 1.50    Types: Cigarettes  . Smokeless tobacco: Never Used  . Alcohol use No  . Drug use: Unknown  . Sexual activity: Not on file   Other Topics Concern  . Not on file   Social History Narrative  . No narrative on file    Prior to Admission medications   Medication Sig Start Date End Date Taking? Authorizing Provider  aspirin 81 MG tablet Take 81 mg by mouth daily.    Historical Provider, MD  HYDROcodone-ibuprofen (VICOPROFEN) 7.5-200 MG per tablet Take 1 tablet by mouth every 8 (eight) hours as needed for moderate pain. Patient not taking: Reported on 11/28/2014 04/24/14   Dalia Heading, PA-C  ibuprofen (ADVIL,MOTRIN) 600 MG tablet Take 1 tablet (600 mg total) by mouth every 6 (six) hours as needed. Patient not taking: Reported on 11/28/2014 01/08/14   Loma Sousa  F [place], MD  lisinopril (PRINIVIL,ZESTRIL) 10 MG tablet Take 10 mg by mouth daily.    Historical Provider, MD  oxyCODONE-acetaminophen (PERCOCET/ROXICET) 5-325 MG per tablet Take 1 tablet by mouth every 6 (six) hours as needed for severe pain. Patient not taking: Reported on 11/28/2014 01/08/14   Merryl Hacker, MD  predniSONE (DELTASONE) 50 MG tablet Take 1 tablet (50 mg total) by mouth daily with breakfast. Patient not taking: Reported on 11/28/2014 04/24/14   Dalia Heading, PA-C  traMADol (ULTRAM) 50 MG tablet Take 1 tablet (50 mg total) by mouth 3 (three) times daily after meals. 11/28/14   Lance Bosch, MD     Current Facility-Administered Medications  Medication Dose Route Frequency Provider Last Rate Last Dose  . acetaminophen (TYLENOL) tablet 650 mg  650 mg Oral Q6H PRN Rexene Alberts, MD      . aspirin tablet 81 mg  81 mg Oral Daily Rexene Alberts, MD      . azithromycin [facility]) tablet 500 mg  500 mg Oral Daily Rexene Alberts, MD       Followed by  . [START ON 08/08/2016] azithromycin (ZITHROMAX) tablet 250 mg  250 mg Oral Daily Rexene Alberts, MD      . docusate sodium (COLACE) capsule 200 mg  200 mg Oral Daily Rexene Alberts, MD      . guaiFENesin (MUCINEX) 12 hr tablet 1,200 mg  1,200 mg Oral BID Rexene Alberts, MD      . guaiFENesin-dextromethorphan (ROBITUSSIN DM) 100-10 MG/5ML syrup 5 mL  5 mL Oral Q4H PRN Rexene Alberts, MD      . lidocaine (PF) (XYLOCAINE) 1 % injection           . [START ON 08/08/2016] lisinopril (PRINIVIL,ZESTRIL) tablet 10 mg  10 mg Oral Daily Rexene Alberts, MD      . midazolam (VERSED) 2 MG/2ML injection           . oxyCODONE-acetaminophen (PERCOCET/ROXICET) 5-325 MG per tablet 2 tablet  2 tablet Oral Q6H PRN Rexene Alberts, MD      . traMADol [facility]) tablet 100 mg  100 mg Oral Q6H PRN Rexene Alberts, MD       Current Outpatient Prescriptions  Medication Sig Dispense Refill  . aspirin 81 MG tablet Take 81 mg by mouth daily.    Marland Kitchen HYDROcodone-ibuprofen (VICOPROFEN) 7.5-200 MG per tablet Take 1 tablet by mouth every 8 (eight) hours as needed for moderate pain. (Patient not taking: Reported on 11/28/2014) 20 tablet 0  . ibuprofen (ADVIL,MOTRIN) 600 MG tablet Take 1 tablet (600 mg total) by mouth every 6 (six) hours as needed. (Patient not taking: Reported on 11/28/2014) 30 tablet 0  . lisinopril (PRINIVIL,ZESTRIL) 10 MG tablet Take 10 mg by mouth daily.    Marland Kitchen oxyCODONE-acetaminophen (PERCOCET/ROXICET) 5-325 MG per tablet Take 1 tablet by mouth every 6 (six) hours as needed for severe pain. (Patient not taking: Reported on 11/28/2014) 15 tablet 0  .  predniSONE (DELTASONE) 50 MG tablet Take 1 tablet (50 mg total) by mouth daily with breakfast. (Patient not taking: Reported on 11/28/2014) 5 tablet 0  . traMADol (ULTRAM) 50 MG tablet Take 1 tablet (50 mg total) by mouth 3 (three) times daily after meals. 60 tablet 2    Allergies  Allergen Reactions  . Tylenol [Acetaminophen] Nausea And Vomiting      Review of Systems:   General:  normal appetite, normal energy, no weight gain, no weight  loss, no fever  Cardiac:  no chest pain with exertion, + chest pain at rest, + SOB with exertion, + resting SOB, no PND, no orthopnea, no palpitations, no arrhythmia, no atrial fibrillation, no LE edema, no dizzy spells, no syncope  Respiratory:  + shortness of breath, no home oxygen, no productive cough, + dry cough, + bronchitis, no wheezing, no hemoptysis, no asthma, + pain with inspiration or cough, no sleep apnea, no CPAP at night  GI:   no difficulty swallowing, no reflux, no frequent heartburn, no hiatal hernia, no abdominal pain, no constipation, no diarrhea, no hematochezia, no hematemesis, no melena  GU:   no dysuria,  no frequency, no urinary tract infection, no hematuria, no enlarged prostate, no kidney stones, no kidney disease  Vascular:  no pain suggestive of claudication, no pain in feet, no leg cramps, no varicose veins, no DVT, no non-healing foot ulcer  Neuro:   no stroke, no TIA's, no seizures, no headaches, no temporary blindness one eye,  no slurred speech, no peripheral neuropathy, + chronic pain, no instability of gait, no memory/cognitive dysfunction  Musculoskeletal: no arthritis, no joint swelling, no myalgias, no difficulty walking, normal mobility   Skin:   no rash, no itching, no skin infections, no pressure sores or ulcerations  Psych:   no anxiety, no depression, no nervousness, no unusual recent stress  Eyes:   no blurry vision, no floaters, no recent vision changes  ENT:   no hearing loss, + loose or painful teeth, no  dentures, last saw dentist many years ago  Hematologic:  no easy bruising, no abnormal bleeding, no clotting disorder, no frequent epistaxis  Endocrine:  no diabetes, does not check CBG's at home     Physical Exam:   BP (!) 174/110   Pulse 102   Temp 98.3 F (36.8 C) (Oral)   Resp (!) 27   SpO2 92%   General:  Appears older than stated age, NAD  HEENT:  Unremarkable   Neck:   no JVD, no bruits, no adenopathy   Chest:   diminished breath sounds on right side, no wheezes, no rhonchi   CV:   RRR, no murmur   Abdomen:  soft, non-tender, no masses   Extremities:  warm, well-perfused, pulses diminished but palpable, no lower extremity edema  Rectal/GU  Deferred  Neuro:   Grossly non-focal and symmetrical throughout  Skin:   Clean and dry, no rashes, no breakdown  Diagnostic Tests:  CHEST  2 VIEW  COMPARISON:  None.  FINDINGS: Emphysema and coarse interstitial opacities probably representing scarring and chronic bronchitis. Moderate right-sided pneumothorax. Normal cardiac silhouette. Bones are unremarkable.  IMPRESSION: Moderate right-sided pneumothorax. COPD and chronic bronchitis of the lungs.  These results were called by telephone at the time of interpretation on 08/07/2016 at 5:36 pm to Dr. Threasa Beards BELFI , who verbally acknowledged these results.   Electronically Signed   By: Kristine Garbe M.D.   On: 08/07/2016 17:37   Impression:  Right primary spontaneous pneumothorax in the setting of likely ongoing acute on chronic bronchitis in a patient with ongoing tobacco abuse and known COPD.  Plan:  Chest tube placement followed by hospital admission. We will treat the patient with a short course of oral antibiotics for presumed tracheobronchitis. He needs smoking cessation counseling.   I spent in excess of 60 minutes during the conduct of this hospital consultation and >50% of this time involved direct face-to-face encounter for counseling and/or  coordination of the patient's  care.    Valentina Gu. Roxy Manns, MD 08/07/2016 7:23 PM

## 2016-08-07 NOTE — ED Notes (Signed)
Bed switched from 3E to 2W

## 2016-08-07 NOTE — ED Triage Notes (Signed)
Pt reports non-radiating right sided chest pain he describes as feeling like an elephant on his chest associated with SOB; onset this morning.

## 2016-08-07 NOTE — Op Note (Signed)
CARDIOTHORACIC SURGERY OPERATIVE NOTE  Date of Procedure:  08/07/2016  Preoperative Diagnosis: Right Spontaneous Pneumothorax  Postoperative Diagnosis: Same  Procedure:   Right chest tube placement  Surgeon:   Valentina Gu. Roxy Manns, MD  Anesthesia: 1% lidocaine local with intravenous sedation    DETAILS OF THE OPERATIVE PROCEDURE  Following full informed consent the patient was given midazolam 4 mg and morphine sulfate 4 mg intravenously and continuously monitored for rhythm, BP and oxygen saturation. The right chest was prepared and draped in a sterile manner. 1% lidocaine was utilized to anesthetize the skin and subcutaneous tissues. A small incision was made and a 28 French straight chest tube was placed through the incision into the pleural space. The tube was secured to the skin and connected to a closed suction collection device. The patient tolerated the procedure well. A portable CXR was ordered. There were no complications.    Valentina Gu. Roxy Manns, MD 08/07/2016 7:17 PM

## 2016-08-07 NOTE — ED Notes (Signed)
Signed consent signed at bedside.

## 2016-08-07 NOTE — ED Provider Notes (Signed)
[facility] DEPT Provider Note   CSN: [id number] Arrival date & time: 08/07/16  1659     History   Chief Complaint Chief Complaint  Patient presents with  . Chest Pain    HPI Benjamin Burke is a 56 y.o. male with pertinent past medical history of chronic back pain, COPD, hypertension presents to the emergency department with acute onset right-sided chest pain associated with shortness of breath this morning when he woke up. Patient has long history of COPD, is a current every day smoker. One pack per day since age 32. Patient states he has been noncompliant with his COPD medications for 2-3 months. Patient reports recent increase in cough frequency and sputum production in the past 2-3 days. Denies fevers, night sweats, chills, recent unintentional weight loss. Patient denies exertional chest pain, nausea, diaphoresis, abdominal pain, urinary symptoms. Patient denies previous known heart disease or CAD.  HPI  Past Medical History:  Diagnosis Date  . Chronic back pain   . COPD (chronic obstructive pulmonary disease) ([place])   . Hypertension   . Right spontaneous pneumothorax 08/07/2016    Patient Active Problem List   Diagnosis Date Noted  . Right spontaneous pneumothorax 08/07/2016  . Bronchitis 08/07/2016  . Nicotine dependence, cigarettes, uncomplicated 40/98/1191  . Radiculopathy of lumbosacral region 09/12/2013  . Chronic bilateral low back pain without sciatica 09/12/2013  . COPD (chronic obstructive pulmonary disease) ([place]) 01/27/2013  . Hyperglycemia 01/24/2013  . Essential hypertension 11/21/2012  . Hyperlipidemia with target LDL less than 100 11/21/2012  . Poor dentition 09/27/2012  . Tobacco abuse 03/24/2012    Past Surgical History:  Procedure Laterality Date  . NO PAST SURGERIES         Home Medications    Prior to Admission medications   Medication Sig Start Date End Date Taking? Authorizing Provider  aspirin 81 MG tablet Take 81 mg by mouth daily.     Historical Provider, MD  HYDROcodone-ibuprofen (VICOPROFEN) 7.5-200 MG per tablet Take 1 tablet by mouth every 8 (eight) hours as needed for moderate pain. Patient not taking: Reported on 11/28/2014 04/24/14   Dalia Heading, PA-C  ibuprofen (ADVIL,MOTRIN) 600 MG tablet Take 1 tablet (600 mg total) by mouth every 6 (six) hours as needed. Patient not taking: Reported on 11/28/2014 01/08/14   Merryl Hacker, MD  lisinopril (PRINIVIL,ZESTRIL) 10 MG tablet Take 10 mg by mouth daily.    Historical Provider, MD  oxyCODONE-acetaminophen (PERCOCET/ROXICET) 5-325 MG per tablet Take 1 tablet by mouth every 6 (six) hours as needed for severe pain. Patient not taking: Reported on 11/28/2014 01/08/14   Merryl Hacker, MD  predniSONE (DELTASONE) 50 MG tablet Take 1 tablet (50 mg total) by mouth daily with breakfast. Patient not taking: Reported on 11/28/2014 04/24/14   Dalia Heading, PA-C  traMADol (ULTRAM) 50 MG tablet Take 1 tablet (50 mg total) by mouth 3 (three) times daily after meals. 11/28/14   Lance Bosch, MD    Family History Family History  Problem Relation Age of Onset  . Arthritis Mother   . Diabetes Mother   . Hyperlipidemia Mother   . Hypertension Mother   . Stroke Mother   . Kidney disease Mother   . Cancer Father     Social History Social History  Substance Use Topics  . Smoking status: Current Every Day Smoker    Packs/day: 1.50    Types: Cigarettes  . Smokeless tobacco: Never Used  . Alcohol use No     Allergies  Tylenol [acetaminophen]   Review of Systems Review of Systems  Constitutional: Negative for chills and fever.  HENT: Negative for congestion and sore throat.   Eyes: Negative for visual disturbance.  Respiratory: Positive for cough, chest tightness and shortness of breath.   Cardiovascular: Positive for chest pain. Negative for palpitations.  Gastrointestinal: Negative for abdominal pain, blood in stool, constipation, diarrhea, nausea and  vomiting.  Genitourinary: Negative for difficulty urinating, flank pain and hematuria.  Musculoskeletal: Negative for joint swelling and myalgias.  Skin: Negative for rash.  Neurological: Negative for dizziness, syncope, weakness, light-headedness and headaches.  Hematological: Negative.   Psychiatric/Behavioral: Negative.      Physical Exam Updated Vital Signs BP (!) 174/110   Pulse 102   Temp 98.3 F (36.8 C) (Oral)   Resp (!) 27   SpO2 92%   Physical Exam  Constitutional: He is oriented to person, place, and time. He appears well-developed and well-nourished. No distress.  HENT:  Head: Normocephalic and atraumatic.  Nose: Nose normal.  Mouth/Throat: Oropharynx is clear and moist. No oropharyngeal exudate.  Eyes: Conjunctivae and EOM are normal. Pupils are equal, round, and reactive to light.  Neck: Normal range of motion. Neck supple. No JVD present. No tracheal deviation present.  Cardiovascular: Regular rhythm and intact distal pulses.  Exam reveals no friction rub.   No murmur heard. Tachycardic HR 110  Pulmonary/Chest: Tachypnea noted. No respiratory distress. He has decreased breath sounds in the right middle field and the right lower field. He has wheezes in the left upper field and the left middle field. He has no rhonchi. He has no rales. He exhibits no tenderness and no retraction.  Tachypnic  Abdominal: Soft. Bowel sounds are normal. He exhibits no distension. There is no tenderness.  Musculoskeletal: Normal range of motion. He exhibits no deformity.  Lymphadenopathy:    He has no cervical adenopathy.  Neurological: He is alert and oriented to person, place, and time.  Skin: Skin is warm and dry. Capillary refill takes less than 2 seconds.  Psychiatric: He has a normal mood and affect. His behavior is normal. Judgment and thought content normal.  Nursing note and vitals reviewed.    ED Treatments / Results  Labs (all labs ordered are listed, but only abnormal  results are displayed) Labs Reviewed  BASIC METABOLIC PANEL - Abnormal; Notable for the following:       Result Value   Potassium 3.2 (*)    Glucose, Bld 124 (*)    All other components within normal limits  CBC - Abnormal; Notable for the following:    WBC 12.1 (*)    All other components within normal limits  I-STAT TROPOININ, ED    EKG  EKG Interpretation  Date/Time:  Friday August 07 2016 17:06:59 EST Ventricular Rate:  119 PR Interval:  136 QRS Duration: 90 QT Interval:  326 QTC Calculation: 458 R Axis:   110 Text Interpretation:  Sinus tachycardia Right axis deviation Pulmonary disease pattern Abnormal ECG Confirmed by BELFI  MD, MELANIE ([id number]) on 08/07/2016 6:39:31 PM       Radiology Dg Chest 2 View  Result Date: 08/07/2016 CLINICAL DATA:  56 y/o M; right-sided chest pain and shortness of breath. EXAM: CHEST  2 VIEW COMPARISON:  None. FINDINGS: Emphysema and coarse interstitial opacities probably representing scarring and chronic bronchitis. Moderate right-sided pneumothorax. Normal cardiac silhouette. Bones are unremarkable. IMPRESSION: Moderate right-sided pneumothorax. COPD and chronic bronchitis of the lungs. These results were called by telephone at  the time of interpretation on 08/07/2016 at 5:36 pm to Dr. Threasa Beards BELFI , who verbally acknowledged these results. Electronically Signed   By: Kristine Garbe M.D.   On: 08/07/2016 17:37    Procedures Procedures (including critical care time)  Medications Ordered in ED Medications  midazolam (VERSED) 2 MG/2ML injection (not administered)  lidocaine (PF) (XYLOCAINE) 1 % injection (not administered)  aspirin tablet 81 mg (not administered)  traMADol (ULTRAM) tablet 100 mg (not administered)  lisinopril (PRINIVIL,ZESTRIL) tablet 10 mg (not administered)  oxyCODONE-acetaminophen (PERCOCET/ROXICET) 5-325 MG per tablet 2 tablet (not administered)  docusate sodium (COLACE) capsule 200 mg (not administered)    acetaminophen (TYLENOL) tablet 650 mg (not administered)  guaiFENesin (MUCINEX) 12 hr tablet 1,200 mg (not administered)  guaiFENesin-dextromethorphan (ROBITUSSIN DM) 100-10 MG/5ML syrup 5 mL (not administered)  azithromycin (ZITHROMAX) tablet 500 mg (not administered)    Followed by  azithromycin (ZITHROMAX) tablet 250 mg (not administered)  lidocaine (PF) (XYLOCAINE) 1 % injection (30 mLs  Given 08/07/16 1850)  morphine 4 MG/ML injection 2 mg (2 mg Intravenous Given 08/07/16 1845)  midazolam (VERSED) injection 2 mg (2 mg Intravenous Given 08/07/16 1845)  midazolam (VERSED) injection 2 mg (2 mg Intravenous Given 08/07/16 1905)  morphine 4 MG/ML injection 2 mg (2 mg Intravenous Given 08/07/16 1906)     Initial Impression / Assessment and Plan / ED Course  I have reviewed the triage vital signs and the nursing notes.  Pertinent labs & imaging results that were available during my care of the patient were reviewed by me and considered in my medical decision making (see chart for details).  Clinical Course as of Aug 07 1917  Fri Aug 07, 2016  1833 IMPRESSION: Moderate right-sided pneumothorax. COPD and chronic bronchitis of the lungs. DG Chest 2 View [CG]  1834 Troponin i, poc: 0.00 [CG]  1909 Sinus tachycardia Right axis deviation Pulmonary disease pattern Abnormal ECG EKG 12-Lead [CG]  1910 WBC: (!) 12.1 [CG]  1910 Hemoglobin: 15.5 [CG]  1910 Creatinine: 1.01 [CG]  1910 Temp: 98.3 F (36.8 C) [CG]  1910 Pulse Rate: 102 [CG]  1910 BP: (!) 174/110 [CG]  1910 Resp: (!) 27 [CG]  1910 SpO2: 92 % [CG]  1914 Chest tube placed bedside by CT surgery (Dr. Ricard Dillon)  [CG]    Clinical Course User Index [CG] Kinnie Feil, PA-C   56 year old male with pertinent past medical history of COPD (noncompliant with COPD medications in the last 2-3 months), hypertension, tobacco abuse presents with sudden onset right-sided chest pain associated with shortness of breath. Chest x-ray showed moderate  right-sided pneumothorax and chronic lung changes due to COPD and bronchitis. EKG and troponin nonischemic. Mild leukocytosis at 12.1. Otherwise lab work is reassuring. Patient has been hypertensive in the emergency department prior to and immediately after chest tube insertion, likely from pain. Consult did CT surgery who placed chest tube at bedside (Dr. Ricard Dillon). Patient will be admitted for observation and pain control. Patient discussed with Dr. Tamera Punt who agrees with ED workup and admission.  Final Clinical Impressions(s) / ED Diagnoses   Final diagnoses:  Spontaneous pneumothorax    New Prescriptions New Prescriptions   No medications on file     Kinnie Feil, Hershal Coria 08/07/16 [place], MD 08/07/16 2037

## 2016-08-07 NOTE — ED Notes (Signed)
Cardiothoracic surgeon at bedside 

## 2016-08-08 ENCOUNTER — Inpatient Hospital Stay (HOSPITAL_COMMUNITY): Payer: Medicare HMO

## 2016-08-08 MED ORDER — ENOXAPARIN SODIUM 40 MG/0.4ML ~~LOC~~ SOLN
40.0000 mg | SUBCUTANEOUS | Status: DC
  Administered 2016-08-08 – 2016-08-14 (×7): 40 mg via SUBCUTANEOUS
  Filled 2016-08-08 (×7): qty 0.4

## 2016-08-08 MED ORDER — LOSARTAN POTASSIUM 50 MG PO TABS
50.0000 mg | ORAL_TABLET | Freq: Every day | ORAL | Status: DC
  Administered 2016-08-08 – 2016-08-14 (×7): 50 mg via ORAL
  Filled 2016-08-08 (×7): qty 1

## 2016-08-08 MED ORDER — NICOTINE 21 MG/24HR TD PT24
21.0000 mg | MEDICATED_PATCH | Freq: Every day | TRANSDERMAL | Status: DC
  Administered 2016-08-08 – 2016-08-14 (×7): 21 mg via TRANSDERMAL
  Filled 2016-08-08 (×7): qty 1

## 2016-08-08 NOTE — Progress Notes (Addendum)
      [address]       [place] [address]             [phone]       Subjective:  Doing okay.  Has some pain at chest tube site, which relieves with pain medication.  Would like a nicotine patch.  Objective: Vital signs in last 24 hours: Temp:  [97.9 F (36.6 C)-98.3 F (36.8 C)] 97.9 F (36.6 C) (03/10 0500) Pulse Rate:  [72-120] 72 (03/10 0500) Cardiac Rhythm: Sinus tachycardia (03/09 1831) Resp:  [15-27] 20 (03/10 0500) BP: (148-196)/(89-110) 148/90 (03/10 0500) SpO2:  [92 %-100 %] 96 % (03/10 0500) Weight:  [173 lb 6.4 oz (78.7 kg)] 173 lb 6.4 oz (78.7 kg) (03/09 2112)  Intake/Output from previous day: 03/09 0701 - 03/10 0700 In: 240 [P.O.:240] Out: 250 [Urine:250]  General appearance: alert, cooperative and no distress Heart: regular rate and rhythm Lungs: wheezes scattered, bilaterally Abdomen: soft, non-tender; bowel sounds normal; no masses,  no organomegaly Wound: clean and dry  Lab Results:  Recent Labs  08/07/16 1716  WBC 12.1*  HGB 15.5  HCT 45.2  PLT 352   BMET:  Recent Labs  08/07/16 1716  NA 138  K 3.2*  CL 101  CO2 27  GLUCOSE 124*  BUN 7  CREATININE 1.01  CALCIUM 9.4    PT/INR: No results for input(s): LABPROT, INR in the last 72 hours. ABG No results found for: PHART, HCO3, TCO2, ACIDBASEDEF, O2SAT CBG (last 3)  No results for input(s): GLUCAP in the last 72 hours.  Assessment/Plan:  1. Chest tube- no air leak present, minimal output, will leave on suction today 2. Pulm- no acute issues, CXR is free from pneumothorax, aggressive pulm toilet 3. CV- + HTN, SBP running as high as 190s- will increase home Cozaar to 50 mg daily 4. ID- Tracheobronchitis- continue Azithromycin, remains afebrile 5. Dispo- patient stable, increase cozaar for better BP control, chest tube to remain on suction today, will get CXR in AM... Start lovenox for DVT prophlaxis   LOS: 1 day    BARRETT, ERIN 08/08/2016  I have seen  and examined the patient and agree with the assessment and plan as outlined.  Leave tube on suction today.  Increase Cozaar.  I spent in excess of 15 minutes during the conduct of this hospital encounter and >50% of this time involved direct face-to-face encounter with the patient for counseling and/or coordination of their care.    Rexene Alberts, MD 08/08/2016 10:20 AM

## 2016-08-09 ENCOUNTER — Inpatient Hospital Stay (HOSPITAL_COMMUNITY): Payer: Medicare HMO

## 2016-08-09 DIAGNOSIS — F1410 Cocaine abuse, uncomplicated: Secondary | ICD-10-CM | POA: Diagnosis present

## 2016-08-09 HISTORY — DX: Cocaine abuse, uncomplicated: F14.10

## 2016-08-09 LAB — RAPID URINE DRUG SCREEN, HOSP PERFORMED
Amphetamines: NOT DETECTED
Barbiturates: NOT DETECTED
Benzodiazepines: POSITIVE — AB
Cocaine: POSITIVE — AB
Opiates: POSITIVE — AB
Tetrahydrocannabinol: NOT DETECTED

## 2016-08-09 NOTE — Progress Notes (Signed)
Contacted via nurse earlier today regarding possible drug use by patient.  He stated the floor received an anonymous phone call stating the patient had be brought cocaine this morning and had used it.  I instructed Jeneen Rinks to obtain a UDS.  This came back positive for cocaine.  For patient and staff safety, the patient will not be allowed to have visitors at this time.  Will also have security check patients belongings to ensure no cocaine is in his possession.   Thursa Emme PA-C

## 2016-08-09 NOTE — Discharge Summary (Addendum)
Physician Discharge Summary  Patient ID: Benjamin Burke MRN: [id number] DOB/AGE: 09-14-1960 56 y.o.  Admit date: 08/07/2016 Discharge date: 08/14/2016  Admission Diagnoses:  Patient Active Problem List   Diagnosis Date Noted  . Right spontaneous pneumothorax 08/07/2016  . Bronchitis 08/07/2016  . Nicotine dependence, cigarettes, uncomplicated 78/[id number]/8502  . Radiculopathy of lumbosacral region 09/12/2013  . Chronic bilateral low back pain without sciatica 09/12/2013  . COPD (chronic obstructive pulmonary disease) ([place]) 01/27/2013  . Hyperglycemia 01/24/2013  . Essential hypertension 11/21/2012  . Hyperlipidemia with target LDL less than 100 11/21/2012  . Poor dentition 09/27/2012  . Tobacco abuse 03/24/2012   Discharge Diagnoses:   Patient Active Problem List   Diagnosis Date Noted  . Cocaine abuse 08/09/2016  . Right spontaneous pneumothorax 08/07/2016  . Bronchitis 08/07/2016  . Nicotine dependence, cigarettes, uncomplicated 77/41/2878  . Radiculopathy of lumbosacral region 09/12/2013  . Chronic bilateral low back pain without sciatica 09/12/2013  . COPD (chronic obstructive pulmonary disease) ([place]) 01/27/2013  . Hyperglycemia 01/24/2013  . Essential hypertension 11/21/2012  . Hyperlipidemia with target LDL less than 100 11/21/2012  . Poor dentition 09/27/2012  . Tobacco abuse 03/24/2012   Discharged Condition: good  History of Present Illness:  [name] is 56 yo white male with long standing history of tobacco abuse, HTN, Hyperlipidemia, and COPD.  He was in his usual state of health until 08/07/2016.  That morning the patient developed sudden onset right sided chest discomfort with associated shortness of breath.  This developed after the patient woke up.  The symptoms persisted prompting the patient to report to the Emergency Room.  CXR was obtained and showed a moderate to large right pneumothorax.  CT surgery consult was obtained.  Dr. Roxy Manns evaluated the patient and  admitted patient for chest tube placement.  Patient admitted to smoking 1.5 ppd.  He also states he was recently treated for bronchitis.  Hospital Course:   The patient underwent right sided chest tube placement.  He tolerated the procedure without difficulty.  Follow up CXR showed resolution of pneumothorax.  During the patient's hospital stay his CXR remained stable.  His chest tube was transitioned to water seal on 08/09/2016.  Also that day the patient's nurse received an anonymous tip that the patient was brought cocaine and used this in the hospital.  UDS was obtained and was in fact positive for cocaine.  The patient was not allowed to have visitors at that point.  Follow up CXR was obtained and showed no penumothorax.  His chest tube was removed on 08/13/16.  Repeat CXR again showed no pneumothorax.  He was weaned off oxygen.  He was medically stable for discharge home. He will need follow-up arranged by the medicine team for substance abuse.   Treatments: Bedside Chest Tube Placement  Disposition: 01-Home or Self Care  Discharge Instructions    Discharge patient    Complete by:  As directed    Discharge disposition:  01-Home or Self Care   Discharge patient date:  08/14/2016     Allergies as of 08/14/2016      Reactions   Tylenol [acetaminophen] Nausea And Vomiting      Medication List    STOP taking these medications   HYDROcodone-ibuprofen 7.5-200 MG tablet Commonly known as:  VICOPROFEN   ibuprofen 600 MG tablet Commonly known as:  ADVIL,MOTRIN   oxyCODONE-acetaminophen 5-325 MG tablet Commonly known as:  PERCOCET/ROXICET   traMADol 50 MG tablet Commonly known as:  Veatrice Bourbon  TAKE these medications   aspirin EC 81 MG tablet Take 162 mg by mouth daily as needed (pain).   atorvastatin 40 MG tablet Commonly known as:  LIPITOR Take 40 mg by mouth at bedtime.   losartan 50 MG tablet Commonly known as:  COZAAR Take 1 tablet (50 mg total) by mouth daily. What  changed:  medication strength  how much to take   nicotine 21 mg/24hr patch Commonly known as:  NICODERM CQ - dosed in mg/24 hours Place 1 patch (21 mg total) onto the skin daily.   Oxycodone HCl 10 MG Tabs Take 1 tablet (10 mg total) by mouth every 3 (three) hours as needed.   PROAIR HFA 108 (90 Base) MCG/ACT inhaler Generic drug:  albuterol Inhale 2 puffs into the lungs every 6 (six) hours as needed for wheezing or shortness of breath.      Follow-up Information    Rexene Alberts, MD Follow up in 2 week(s).   Specialty:  Cardiothoracic Surgery Why:  Your appointment is on 08/25/2016 at 3:30pm Please arrive at 3:00pm for a chest xray  at [facility] located on the first floor of our office building  Contact information: [address] [place] [address] [phone]        Vicenta Aly, [place]. Call in 1 day(s).   Specialty:  Nurse Practitioner Contact information: [place] [address] [phone]        nursing Follow up.   Why:  Your suture removal appointment is at 10:15 on Friday 08/21/2016.  Contact information: Dr. Guy Sandifer office          Signed: Elgie Collard 08/14/2016, 9:18 AM

## 2016-08-09 NOTE — Progress Notes (Addendum)
      [address]       [place] [address]             [phone]      Subjective:  No new complaints.  Some pain at chest tube site.  Objective: Vital signs in last 24 hours: Temp:  [97.8 F (36.6 C)-98.3 F (36.8 C)] 98.3 F (36.8 C) (03/11 0500) Pulse Rate:  [79-89] 89 (03/11 0500) Cardiac Rhythm: Normal sinus rhythm (03/10 1956) Resp:  [18-20] 20 (03/11 0500) BP: (131-159)/(69-112) 131/69 (03/11 0500) SpO2:  [92 %-97 %] 94 % (03/11 0500)  Intake/Output from previous day: 03/10 0701 - 03/11 0700 In: -  Out: 390 [Urine:390]  General appearance: alert, cooperative and no distress Heart: regular rate and rhythm Lungs: clear to auscultation bilaterally Abdomen: soft, non-tender; bowel sounds normal; no masses,  no organomegaly Wound: clean and dry  Lab Results:  Recent Labs  08/07/16 1716  WBC 12.1*  HGB 15.5  HCT 45.2  PLT 352   BMET:  Recent Labs  08/07/16 1716  NA 138  K 3.2*  CL 101  CO2 27  GLUCOSE 124*  BUN 7  CREATININE 1.01  CALCIUM 9.4    PT/INR: No results for input(s): LABPROT, INR in the last 72 hours. ABG No results found for: PHART, HCO3, TCO2, ACIDBASEDEF, O2SAT CBG (last 3)  No results for input(s): GLUCAP in the last 72 hours.  Assessment/Plan:  1. Chest tube- no air leak present, CXR is free from pneumothorax- will place chest tube to water seal today 2. Pulm- severe COPD, continue aggressive pulm toilet, wean oxygen as tolerated 3. CV- NSR, HTN improved, continue Cozaar 4. Nicotine abuse- continue nicotine patch 5. Dispo- patient stable, place chest tube water seal, repeat CXR in AM   LOS: 2 days    BARRETT, ERIN 08/09/2016  I have seen and examined the patient and agree with the assessment and plan as outlined.  I spent in excess of 15 minutes during the conduct of this hospital encounter and >50% of this time involved direct face-to-face encounter with the patient for counseling and/or coordination  of their care.   Rexene Alberts, MD 08/09/2016 10:56 AM

## 2016-08-09 NOTE — Progress Notes (Signed)
Patient in room awake and alert lying in bed upon entering room at 2010. Oriented x 3. Patient with right side chest tube; chest tube draining and intact. Dressing intact with old serosanguineous drainage. Patient with no c/o sob. Patient complains of 7/10 right chest pain at site of chest tube sharp in nature. Patient states pain is constant and relieved with pain meds; tolerable pain level 4/10. Administered pain medication at 2014; reassessed pain at 2114 and patient stated pain is at tolerable level at 4/10. Call bell and belongings within reach. Will continue to monitor patients status.

## 2016-08-09 NOTE — Progress Notes (Signed)
Randol Kern RN and Research scientist (physical sciences) (hospital [facility]) accompanied by security and myself informed the pt that we had received a phone call stating that the pt had and was most likely using cocaine in the room.  Pt was then asked if security could search his room and belongings, pt stated it was ok for security to search his room and belongings, pt voluntarily pointed security in the direction of two plastic bags of cocaine, no other drugs were found in the room or pt's belongings.  The cocaine was flushed and the plastic bags were thrown away.  Two pocket knives were found amongst the pt's belongings, he willingly gave them to security to lock up until discharge, security filled out the appropriate paper work with the pt and took the knives to the security office.

## 2016-08-09 NOTE — Progress Notes (Signed)
An anonymous woman called to say the pt. In room 2w13 was doing cocaine in the room I checked the heart monitor for a rise in heart rate and then  called Erin Barrett and she a order for a urine drug screen

## 2016-08-10 ENCOUNTER — Inpatient Hospital Stay (HOSPITAL_COMMUNITY): Payer: Medicare HMO

## 2016-08-10 ENCOUNTER — Encounter (HOSPITAL_COMMUNITY): Payer: Self-pay | Admitting: Thoracic Surgery (Cardiothoracic Vascular Surgery)

## 2016-08-10 DIAGNOSIS — J410 Simple chronic bronchitis: Secondary | ICD-10-CM

## 2016-08-10 DIAGNOSIS — F17210 Nicotine dependence, cigarettes, uncomplicated: Secondary | ICD-10-CM

## 2016-08-10 DIAGNOSIS — J9311 Primary spontaneous pneumothorax: Secondary | ICD-10-CM

## 2016-08-10 DIAGNOSIS — F1410 Cocaine abuse, uncomplicated: Secondary | ICD-10-CM

## 2016-08-10 DIAGNOSIS — I10 Essential (primary) hypertension: Secondary | ICD-10-CM

## 2016-08-10 LAB — BASIC METABOLIC PANEL
Anion gap: 6 (ref 5–15)
BUN: 8 mg/dL (ref 6–20)
CO2: 33 mmol/L — ABNORMAL HIGH (ref 22–32)
Calcium: 8.9 mg/dL (ref 8.9–10.3)
Chloride: 98 mmol/L — ABNORMAL LOW (ref 101–111)
Creatinine, Ser: 0.85 mg/dL (ref 0.61–1.24)
GFR calc Af Amer: >60 mL/min (ref >60)
GFR calc non Af Amer: >60 mL/min (ref >60)
Glucose, Bld: 98 mg/dL (ref 65–99)
Potassium: 3.7 mmol/L (ref 3.5–5.1)
Sodium: 137 mmol/L (ref 135–145)

## 2016-08-10 LAB — TROPONIN I: Troponin I: <0.03 ng/mL (ref <0.03)

## 2016-08-10 MED ORDER — HYDRALAZINE HCL 20 MG/ML IJ SOLN: 5.0000 mg | INTRAMUSCULAR | Status: DC | PRN

## 2016-08-10 NOTE — Progress Notes (Addendum)
      [address]       [place] [address]             [phone]         Subjective: Complaining of pain this morning.   Objective: Vital signs in last 24 hours: Temp:  [98.1 F (36.7 C)-98.6 F (37 C)] 98.1 F (36.7 C) (03/12 0409) Pulse Rate:  [83-106] 89 (03/12 0409) Cardiac Rhythm: Sinus tachycardia (03/11 1910) Resp:  [18-20] 20 (03/12 0409) BP: (137-178)/(87-99) 139/91 (03/12 0409) SpO2:  [90 %-92 %] 92 % (03/12 0409)     Intake/Output from previous day: 03/11 0701 - 03/12 0700 In: 120 [P.O.:120] Out: 1317 [Urine:1025; Chest Tube:292] Intake/Output this shift: No intake/output data recorded.  General appearance: alert, cooperative and no distress Heart: regular rate and rhythm, S1, S2 normal, no murmur, click, rub or gallop Lungs: clear to auscultation bilaterally Abdomen: soft, non-tender; bowel sounds normal; no masses,  no organomegaly Extremities: extremities normal, atraumatic, no cyanosis or edema Wound: clean and dry  Lab Results:  Recent Labs  08/07/16 1716  WBC 12.1*  HGB 15.5  HCT 45.2  PLT 352   BMET:  Recent Labs  08/07/16 1716  NA 138  K 3.2*  CL 101  CO2 27  GLUCOSE 124*  BUN 7  CREATININE 1.01  CALCIUM 9.4    PT/INR: No results for input(s): LABPROT, INR in the last 72 hours. ABG No results found for: PHART, HCO3, TCO2, ACIDBASEDEF, O2SAT CBG (last 3)  No results for input(s): GLUCAP in the last 72 hours.  Assessment/Plan: S/P right chest tube placement for a spontaneous pneumothorax  1. Chest tube- no air leak present, CXR is free from pneumothorax and looks stable today- possible d/c 2. Pulm- severe COPD, continue aggressive pulm toilet, wean oxygen as tolerated 3. CV- NSR, HTN improved, continue Cozaar 4. Nicotine abuse- continue nicotine patch. Recent cocaine found in the patients room. Security confiscated along with [employer].  5. Dispo- patient stable, chest tube on waterseal and CXR remains  stable without pneumothorax. Can probably remove today but will await official xray read.     LOS: 3 days    Elgie Collard 08/10/2016  I have seen and examined the patient and agree with the assessment and plan as outlined.  However, CXR reveals small right pneumothorax that is definitely larger than yesterday.  Will replace tube to suction.  More importantly, patient was caught using cocaine in his hospital room yesterday.  He freely admitted to using and reportedly gave his drugs to hospital security.  He has demonstrated irrational and self-destructive behavior and clearly needs professional help which is beyond the scope of my practice, although he currently expresses willingness and desire to get help.  Will ask Hospitalist team to take over and consider inpatient psychiatric evaluation.  I spent in excess of 15 minutes during the conduct of this hospital encounter and >50% of this time involved direct face-to-face encounter with the patient for counseling and/or coordination of their care.   Rexene Alberts, MD 08/10/2016 8:29 AM

## 2016-08-10 NOTE — Progress Notes (Signed)
Patient spoke with ex-girlfriend on the phone and she agreed to give him back the key to the car that they co-own. RN went to [phone] and retrieved key and returned to the patient. No further issues at this time.

## 2016-08-10 NOTE — Consult Note (Signed)
[facility] Medical Consultation  Benjamin Burke DVV:[id number] DOB: [birth year] DOA: 08/07/2016 PCP: Vicenta Aly, FNP   Requesting physician: Halford Chessman Date of consultation: 08/10/16 Reason for consultation: irrational and self destructive behavior/management of chronic issues  Impression/Recommendations Principal Problem:   Cocaine abuse Active Problems:   Right spontaneous pneumothorax   COPD (chronic obstructive pulmonary disease) ([address])   Essential hypertension   Tobacco abuse   Nicotine dependence, cigarettes, uncomplicated   Bronchitis  #1. Cocaine abuse. Patient admits to bring in the cocaine. He no dated yesterday. Security disposed of drug. He states he does cocaine "off and on. Urine drug screen positive for cocaine yesterday. Verbalizes remorse regarding behavior. States wants  to stop cocaine. Denies suicide ideation. Verbalizes understanding of policies and willingness to comply -Social work consult -Print production planner for safety however, patient states he brought drugs in himself vs visitor delivering  #2. Hypertension. Only fair control in the emergency department. Home medications include Cozaar. -add prn hydralazine -avoid BB -monitor -will likely need second agent -case management to assist with meds  #3. COPD/bronchitis. Patient continues to smoke. Home medications include albuterol inhaler. Appears to be stable at baseline. Oxygen saturation level 92% on 2 L nasal cannula -Continue inhaler  #4. Tobacco use -Cessation counseling offered -Nicotine  #5. Right spontaneous pneumothorax.. -per thoracis surgery team  [facility] will followup again tomorrow. Please contact if can be of assistance in the meanwhile. Thank you for this consultation.  Chief Complaint: drug use  HPI:  Patient is 56 year old with a history of COPD, tobacco use, hypertension, hyperlipidemia, drug use admitted 3 days ago with moderate to large size right pneumothorax. 30th thoracic  surgery admitted patient inserted chest tube. According to the chart last evening patient was caught using cocaine in his room yesterday. Security searched his belongings found cocaine and disposed of it.   Patient reports he lives with his mother brother and 12 year old. His mother is diabetic and on dialysis and he is primary caregiver.  He is unemployed. He verbalizes remorse regarding behavior yesterday and states "i want to stop, I need to stop" . He denies any suicide ideation. He reports compliance with his home medications and admits that his blood pressures difficult to control. Continues to abuse cocaine "every now and then", so he was diagnosed with bronchitis and started on antibiotic.   Review of Systems:  He denies headache dizziness syncope or near-syncope. He denies chest pain palpitations diaphoresis. He denies abdominal pain nausea vomiting diarrhea constipation melena bright red blood per rectum. He denies dysuria hematuria frequency or urgency. He denies a numbness or tingling of his extremities. He denies unintentional weight loss.  Past Medical History:  Diagnosis Date  . Chronic back pain   . Cocaine abuse 08/09/2016   Patient caught using cocaine in his hospital room while an inpatient for spontaneous pneumothorax  . COPD (chronic obstructive pulmonary disease) ([place])   . Hypertension   . Right spontaneous pneumothorax 08/07/2016   Past Surgical History:  Procedure Laterality Date  . NO PAST SURGERIES     Social History:  reports that he has been smoking Cigarettes.  He has been smoking about 1.50 packs per day. He has never used smokeless tobacco. He reports that he does not drink alcohol. His drug history is not on file.  Allergies  Allergen Reactions  . Tylenol [Acetaminophen] Nausea And Vomiting   Family History  Problem Relation Age of Onset  . Arthritis Mother   . Diabetes Mother   . Hyperlipidemia  Mother   . Hypertension Mother   . Stroke Mother   . Kidney  disease Mother   . Cancer Father     Prior to Admission medications   Medication Sig Start Date End Date Taking? Authorizing Provider  albuterol (PROAIR HFA) 108 (90 Base) MCG/ACT inhaler Inhale 2 puffs into the lungs every 6 (six) hours as needed for wheezing or shortness of breath.   Yes Historical Provider, MD  aspirin EC 81 MG tablet Take 162 mg by mouth daily as needed (pain).   Yes Historical Provider, MD  atorvastatin (LIPITOR) 40 MG tablet Take 40 mg by mouth at bedtime.   Yes Historical Provider, MD  losartan (COZAAR) 25 MG tablet Take 25 mg by mouth daily. 07/15/16  Yes Historical Provider, MD  HYDROcodone-ibuprofen (VICOPROFEN) 7.5-200 MG per tablet Take 1 tablet by mouth every 8 (eight) hours as needed for moderate pain. Patient not taking: Reported on 11/28/2014 04/24/14   Dalia Heading, PA-C  ibuprofen (ADVIL,MOTRIN) 600 MG tablet Take 1 tablet (600 mg total) by mouth every 6 (six) hours as needed. Patient not taking: Reported on 11/28/2014 01/08/14   Merryl Hacker, MD  oxyCODONE-acetaminophen (PERCOCET/ROXICET) 5-325 MG per tablet Take 1 tablet by mouth every 6 (six) hours as needed for severe pain. Patient not taking: Reported on 11/28/2014 01/08/14   Merryl Hacker, MD  traMADol (ULTRAM) 50 MG tablet Take 1 tablet (50 mg total) by mouth 3 (three) times daily after meals. Patient not taking: Reported on 08/07/2016 11/28/14   Lance Bosch, MD   Physical Exam: Blood pressure (!) 139/91, pulse 89, temperature 98.1 F (36.7 C), temperature source Oral, resp. rate 20, height 5\' 6"  (1.676 m), weight 78.7 kg (173 lb 6.4 oz), SpO2 92 %. Vitals:   08/09/16 2100 08/10/16 0409  BP: (!) 172/99 (!) 139/91  Pulse:  89  Resp:  20  Temp:  98.1 F (36.7 C)     General:  Sitting up in bed looking much older than his stated age well-nourished smiling no acute distress  Eyes: Pupils equal round reactive to light EOMI no scleral icterus  ENT: Ears clear nose without drainage  oropharynx without erythema or exudate  Neck: Supple no JVD full range of motion no lymphadenopathy  Cardiovascular: Irregular rate and rhythm no murmur no gallop no rub no lower extremity edema chest tube right chest intact  Respiratory: Breath sounds with fair air movement I hear no crackles no wheeze no rhonchi  Abdomen: Positive bowel sounds soft nondistended no guarding or rebounding  Skin: Warm and dry no rash  Musculoskeletal: Joints without swelling/erythema full range of motion fair muscle tone  Psychiatric: Cough and cooperative appropriate  Neurologic: Alert and oriented each clear facial symmetry moves all extremities  Labs on Admission:  Basic Metabolic Panel:  Recent Labs Lab 08/07/16 1716  NA 138  K 3.2*  CL 101  CO2 27  GLUCOSE 124*  BUN 7  CREATININE 1.01  CALCIUM 9.4   Liver Function Tests: No results for input(s): AST, ALT, ALKPHOS, BILITOT, PROT, ALBUMIN in the last 168 hours. No results for input(s): LIPASE, AMYLASE in the last 168 hours. No results for input(s): AMMONIA in the last 168 hours. CBC:  Recent Labs Lab 08/07/16 1716  WBC 12.1*  HGB 15.5  HCT 45.2  MCV 90.2  PLT 352   Cardiac Enzymes: No results for input(s): CKTOTAL, CKMB, CKMBINDEX, TROPONINI in the last 168 hours. BNP: Invalid input(s): POCBNP CBG: No results for input(s): GLUCAP  in the last 168 hours.  Radiological Exams on Admission: Dg Chest Port 1 View  Result Date: 08/10/2016 CLINICAL DATA:  Pneumothorax EXAM: PORTABLE CHEST 1 VIEW COMPARISON:  August 09, 2016 FINDINGS: Chest tube position is unchanged on the right. Pneumothorax on the right is somewhat larger with areas of pneumothorax noted in the right apex, upper right lateral hemithorax, and right base region. There does not appear to be tension component. There is mild right base atelectasis. Lungs elsewhere clear. Heart size and pulmonary vascularity are normal. No adenopathy. No bone lesions. IMPRESSION: No  change in pneumothorax positioned with pneumothorax on the right larger than on study performed 1 day prior. There is right base atelectasis. Lungs elsewhere clear. Stable cardiac silhouette. These results will be called to the ordering clinician or representative by the Radiologist Assistant, and communication documented in the PACS or zVision Dashboard. Electronically Signed   By: Lowella Grip III M.D.   On: 08/10/2016 07:52   Dg Chest Port 1 View  Result Date: 08/09/2016 CLINICAL DATA:  Pneumothorax EXAM: PORTABLE CHEST 1 VIEW COMPARISON:  Chest radiograph from one day prior. FINDINGS: Stable position of right chest tube. Minimal subcutaneous emphysema in the lower right chest wall is decreased. Stable cardiomediastinal silhouette with normal heart size. Stable tiny right apical pneumothorax. No left pneumothorax. No pleural effusion. No pulmonary edema. Mild hazy opacities in the mid to lower lungs bilaterally appear stable. IMPRESSION: 1. Stable tiny right apical pneumothorax . 2. Stable mild hazy opacities in the mid to lower lungs bilaterally, favor mild scarring or atelectasis. Electronically Signed   By: Ilona Sorrel M.D.   On: 08/09/2016 08:23    EKG:  Time spent: 55 minutes  [facility]   If 7PM-7AM, please contact night-coverage [email or web address] Password [facility] 08/10/2016, 9:59 AM

## 2016-08-11 ENCOUNTER — Inpatient Hospital Stay (HOSPITAL_COMMUNITY): Payer: Medicare HMO

## 2016-08-11 DIAGNOSIS — J9383 Other pneumothorax: Secondary | ICD-10-CM

## 2016-08-11 NOTE — Progress Notes (Addendum)
      [address]       [place] [address]             [phone]      Subjective:  Mr. Mcknight complains of pain.  Events of weekend reviewed.    Objective: Vital signs in last 24 hours: Temp:  [98.2 F (36.8 C)-98.5 F (36.9 C)] 98.2 F (36.8 C) (03/13 0544) Pulse Rate:  [86-93] 87 (03/13 0544) Cardiac Rhythm: Normal sinus rhythm (03/12 1900) Resp:  [18-21] 18 (03/13 0544) BP: (114-143)/(72-88) 114/72 (03/13 0544) SpO2:  [92 %-93 %] 93 % (03/13 0544)  Intake/Output from previous day: 03/12 0701 - 03/13 0700 In: 840 [P.O.:840] Out: 1100 [Urine:1100]  General appearance: alert, cooperative and no distress Heart: regular rate and rhythm Lungs: clear to auscultation bilaterally Abdomen: soft, non-tender; bowel sounds normal; no masses,  no organomegaly Extremities: extremities normal, atraumatic, no cyanosis or edema Wound: clean and dry  Lab Results: No results for input(s): WBC, HGB, HCT, PLT in the last 72 hours. BMET:  Recent Labs  08/10/16 0947  NA 137  K 3.7  CL 98*  CO2 33*  GLUCOSE 98  BUN 8  CREATININE 0.85  CALCIUM 8.9    PT/INR: No results for input(s): LABPROT, INR in the last 72 hours. ABG No results found for: PHART, HCO3, TCO2, ACIDBASEDEF, O2SAT CBG (last 3)  No results for input(s): GLUCAP in the last 72 hours.  Assessment/Plan:  1. Chest tube- no air leak, right lateral pneumothorax remains present.... Leave on suction today... If doesn't resolve patient may ultimately need VATS with bleb resection 2. Pulm-severe COPD, pneumothorax remains on CXR, continue aggressive pulm toilet 3. Cocaine Abuse- patient found with 2 bags of cocaine on Sunday, admitted to using in hospital room, drugs confiscated 4. CV-NSR, HTN- continue cozaar 5. Dispo- patient stable, leave chest tube to suction today, continue pulm toilet, medicine signed off, continue current care   LOS: 4 days    BARRETT, ERIN 08/11/2016  I have seen and  examined the patient and agree with the assessment and plan as outlined.  No air leak and replacement of tube to suction hasn't change the appearance of residual PTX.  I suspect that the chest tube may be in the fissure and/or just not in communication w/ residual PTX.  Will get non-contrast CT.  I spent in excess of 15 minutes during the conduct of this hospital encounter and >50% of this time involved direct face-to-face encounter with the patient for counseling and/or coordination of their care.    Rexene Alberts, MD 08/11/2016 10:10 AM

## 2016-08-11 NOTE — Care Management Note (Signed)
Case Management Note Marvetta Gibbons RN, BSN Unit 2W-Case Manager [phone]  Patient Details  Name: Benjamin Burke MRN: [id number] Date of Birth: [birth year]  Subjective/Objective:   Pt admitted with spont. pntx- chest tube placed                 Action/Plan: PTA pt lived at home- independent- anticipate return home- referral received for medication needs- spoke with pt at bedside- per pt he has both [facility] and Medicaid coverage and gets his meds with his Medicaid benefits- for $3-$4 copays- pt would not qualify for any medication assistance at this time. No further CM needs noted- will continue to follow.   Expected Discharge Date:                  Expected Discharge Plan:  Home/Self Care  In-House Referral:     Discharge planning Services  CM Consult, Medication Assistance  Post Acute Care Choice:  NA Choice offered to:  NA  DME Arranged:  N/A DME Agency:  NA  HH Arranged:    [place] Agency:     Status of Service:  Completed, signed off  If discussed at [place] of Stay Meetings, dates discussed:    Additional Comments:  Dawayne Patricia, RN 08/11/2016, 4:16 PM

## 2016-08-11 NOTE — Progress Notes (Signed)
Pt seen and chart reviewed, currently admitted with spontaneous PTX due to COPD, s/p chest tube being managed by CVTS, TRH consulted due to cocaine abuse, patient had cocaine with him when he got admitted and used this after admission. Social work consulted for [employer] and advised cessation, he has been to drug rehab before Nothing further to add to his care at this time  Domenic Polite, MD

## 2016-08-11 NOTE — Care Management Important Message (Signed)
Important Message  Patient Details  Name: Benjamin Burke MRN: [id number] Date of Birth: January 13, 1961   Medicare Important Message Given:  Yes    Nathen May 08/11/2016, 12:35 PM

## 2016-08-12 ENCOUNTER — Inpatient Hospital Stay (HOSPITAL_COMMUNITY): Payer: Medicare HMO

## 2016-08-12 DIAGNOSIS — J95811 Postprocedural pneumothorax: Secondary | ICD-10-CM

## 2016-08-12 MED ORDER — MIDAZOLAM HCL 2 MG/2ML IJ SOLN
2.0000 mg | Freq: Once | INTRAMUSCULAR | Status: AC
Start: 2016-08-12 — End: 2016-08-12
  Administered 2016-08-12: 2 mg via INTRAVENOUS
  Filled 2016-08-12: qty 2

## 2016-08-12 MED ORDER — MORPHINE SULFATE (PF) 2 MG/ML IV SOLN
2.0000 mg | Freq: Once | INTRAVENOUS | Status: AC
  Administered 2016-08-12: 2 mg via INTRAVENOUS
  Filled 2016-08-12: qty 1

## 2016-08-12 NOTE — Progress Notes (Addendum)
      [address]       [place] [address]             [phone]         Subjective: Feels okay this morning  Objective: Vital signs in last 24 hours: Temp:  [97.9 F (36.6 C)-98.4 F (36.9 C)] 97.9 F (36.6 C) (03/14 0451) Pulse Rate:  [77-87] 87 (03/14 0451) Cardiac Rhythm: Normal sinus rhythm (03/13 1900) Resp:  [18] 18 (03/14 0451) BP: (125-146)/(84-92) 125/86 (03/14 0451) SpO2:  [91 %-95 %] 92 % (03/14 0451)     Intake/Output from previous day: 03/13 0701 - 03/14 0700 In: 240 [P.O.:240] Out: 500 [Urine:500] Intake/Output this shift: No intake/output data recorded.  General appearance: alert, cooperative and no distress Heart: regular rate and rhythm, S1, S2 normal, no murmur, click, rub or gallop Lungs: diminished breath sounds on the right. CTA on the left in all fields Abdomen: soft, non-tender; bowel sounds normal; no masses,  no organomegaly Extremities: extremities normal, atraumatic, no cyanosis or edema Wound: clean and dry  Lab Results: No results for input(s): WBC, HGB, HCT, PLT in the last 72 hours. BMET:  Recent Labs  08/10/16 0947  NA 137  K 3.7  CL 98*  CO2 33*  GLUCOSE 98  BUN 8  CREATININE 0.85  CALCIUM 8.9    PT/INR: No results for input(s): LABPROT, INR in the last 72 hours. ABG No results found for: PHART, HCO3, TCO2, ACIDBASEDEF, O2SAT CBG (last 3)  No results for input(s): GLUCAP in the last 72 hours.  Assessment/Plan:  1. Chest tube-no air leak, right pneumothorax still present. Chest CT showed: A right chest tube is again seen and courses posteriorly along the right minor fissure into the major fissure. Would not be a good VATs candidate.  2. Pulm- severe COPD, encourage aggressive pulm toilet 3. Substance abuse-cocaine found in patient's room, drugs confiscated 4. CV- NSR in the 80s. BP well controlled on current regimen 5. Dispo-Will discuss CT finding with attending. Appears that the chest tube is in  the fissure as expected. Continue pulm toilet.     LOS: 5 days    Elgie Collard 08/12/2016  I have seen and examined the patient and agree with the assessment and plan as outlined.  Discussed options for management of residual pneumothorax with the patient.  Will proceed with placement of a second small chest tube into anterior component of pneumothorax.  All questions answered.  Rexene Alberts, MD 08/12/2016 9:13 AM

## 2016-08-12 NOTE — Op Note (Signed)
CARDIOTHORACIC SURGERY OPERATIVE NOTE  Date of Procedure:  08/12/2016  Preoperative Diagnosis: Right Pneumothorax  Postoperative Diagnosis: Same  Procedure:   Right chest tube placement  Surgeon:   Valentina Gu. Roxy Manns, MD  Anesthesia: 1% lidocaine local with intravenous sedation    DETAILS OF THE OPERATIVE PROCEDURE  Following full informed consent the patient was given midazolam 2 mg intravenously and continuously monitored for rhythm, BP and oxygen saturation. The right anterior chest was prepared and draped in a sterile manner. 1% lidocaine was utilized to anesthetize the skin and subcutaneous tissues. A small incision was made and a 14 French pigtail chest tube was placed through the incision into the pleural space using Seldinger technique. The tube was secured to the skin and connected to a closed suction collection device. The patient tolerated the procedure well. A portable CXR was ordered. There were no complications.    Valentina Gu. Roxy Manns, MD 08/12/2016 9:58 AM

## 2016-08-13 ENCOUNTER — Inpatient Hospital Stay (HOSPITAL_COMMUNITY): Payer: Medicare HMO

## 2016-08-13 NOTE — Progress Notes (Addendum)
      [address]       [place] [address]             [phone]         Subjective: Asking for pain medication.   Objective: Vital signs in last 24 hours: Temp:  [98 F (36.7 C)-98.9 F (37.2 C)] 98 F (36.7 C) (03/15 0500) Pulse Rate:  [68-100] 83 (03/15 0500) Cardiac Rhythm: Normal sinus rhythm (03/15 0708) Resp:  [18-20] 20 (03/15 0500) BP: (123-167)/(69-105) 158/88 (03/15 0500) SpO2:  [92 %-99 %] 92 % (03/15 0500)     Intake/Output from previous day: 03/14 0701 - 03/15 0700 In: 1200 [P.O.:1200] Out: 2800 [Urine:2800] Intake/Output this shift: No intake/output data recorded.  General appearance: alert, cooperative and no distress Heart: regular rate and rhythm, S1, S2 normal, no murmur, click, rub or gallop Lungs: clear to auscultation bilaterally Abdomen: soft, non-tender; bowel sounds normal; no masses,  no organomegaly Extremities: extremities normal, atraumatic, no cyanosis or edema Wound: clean and dry  Lab Results: No results for input(s): WBC, HGB, HCT, PLT in the last 72 hours. BMET:  Recent Labs  08/10/16 0947  NA 137  K 3.7  CL 98*  CO2 33*  GLUCOSE 98  BUN 8  CREATININE 0.85  CALCIUM 8.9    PT/INR: No results for input(s): LABPROT, INR in the last 72 hours. ABG No results found for: PHART, HCO3, TCO2, ACIDBASEDEF, O2SAT CBG (last 3)  No results for input(s): GLUCAP in the last 72 hours.  Assessment/Plan: S/P second chest tube placement on the right by Dr. Roxy Manns.  1. Chest tubes-now there is no pneumothorax on CXR with the addition of another right chest tube.  2. Pulm- severe COPD, encouraged aggressive pulm toilet 3. Substance abuse-cocaine found in patient's room, drugs confiscated 4. Hypertensive: on Cozaar and PRN hydralazine. HR NSR in the 80s 5. Dispo-Continue current medical management. Second chest tube placed yesterday and now resolution of pneumothorax. May consider removal of first chest tube.     LOS:  6 days    Elgie Collard 08/13/2016  I have seen and examined the patient and agree with the assessment and plan as outlined.  No CXR done this morning.  Check CXR.  D/C both tubes if no PTX.  Possible D/C home tomorrow.  Follow up for management of chronic medical problems and substance abuse to be arranged by medical team.  I spent in excess of 15 minutes during the conduct of this hospital encounter and >50% of this time involved direct face-to-face encounter with the patient for counseling and/or coordination of their care.   Rexene Alberts, MD 08/13/2016 7:53 AM

## 2016-08-14 ENCOUNTER — Inpatient Hospital Stay (HOSPITAL_COMMUNITY): Payer: Medicare HMO

## 2016-08-14 MED ORDER — OXYCODONE HCL 10 MG PO TABS: 10.0000 mg | ORAL_TABLET | ORAL | 0 refills | Status: DC | PRN

## 2016-08-14 MED ORDER — NICOTINE 21 MG/24HR TD PT24: 21.0000 mg | MEDICATED_PATCH | Freq: Every day | TRANSDERMAL | 0 refills | Status: DC

## 2016-08-14 MED ORDER — LOSARTAN POTASSIUM 50 MG PO TABS: 50.0000 mg | ORAL_TABLET | Freq: Every day | ORAL | 1 refills | Status: DC

## 2016-08-14 NOTE — Progress Notes (Addendum)
      [address]       [place] [address]             [phone]        Subjective: He feels good this morning. Eating breakfast peacefully.   Objective: Vital signs in last 24 hours: Temp:  [97.8 F (36.6 C)-99 F (37.2 C)] 97.8 F (36.6 C) (03/16 0410) Pulse Rate:  [70-94] 70 (03/16 0410) Cardiac Rhythm: Normal sinus rhythm (03/15 2015) Resp:  [16-18] 16 (03/16 0410) BP: (115-141)/(65-92) 115/70 (03/16 0410) SpO2:  [93 %-98 %] 93 % (03/16 0410)     Intake/Output from previous day: 03/15 0701 - 03/16 0700 In: 1440 [P.O.:1440] Out: 2101 [Urine:2050; Stool:1; Chest Tube:50] Intake/Output this shift: No intake/output data recorded.  General appearance: alert, cooperative and no distress Heart: regular rate and rhythm, S1, S2 normal, no murmur, click, rub or gallop Lungs: clear to auscultation bilaterally Abdomen: soft, non-tender; bowel sounds normal; no masses,  no organomegaly Extremities: extremities normal, atraumatic, no cyanosis or edema Wound: clean, some bloody drainage on the dressing  Lab Results: No results for input(s): WBC, HGB, HCT, PLT in the last 72 hours. BMET: No results for input(s): NA, K, CL, CO2, GLUCOSE, BUN, CREATININE, CALCIUM in the last 72 hours.  PT/INR: No results for input(s): LABPROT, INR in the last 72 hours. ABG No results found for: PHART, HCO3, TCO2, ACIDBASEDEF, O2SAT CBG (last 3)  No results for input(s): GLUCAP in the last 72 hours.  Assessment/Plan:  1. Chest tubes-now there is no pneumothorax on CXR with the removal of both chest tubes yesterday  2. Pulm- severe COPD, encouraged aggressive pulm toilet 3. Substance abuse-cocaine found in patient's room, drugs confiscated. Primary care to arrange follow-up 4. Hypertensive: on Cozaar and PRN hydralazine. HR NSR in the 80s 5. Dispo-Continue current medical management. CXR without obvious pneumothorax, probably home today.    LOS: 7 days    Elgie Collard 08/14/2016  I have seen and examined the patient and agree with the assessment and plan as outlined.  CXR looks good.  D/C home.  Follow up regarding chronic medical problems and substance abuse per medical team.   I spent in excess of 15 minutes during the conduct of this hospital encounter and >50% of this time involved direct face-to-face encounter with the patient for counseling and/or coordination of their care.           Rexene Alberts, MD 08/14/2016 8:04 AM

## 2016-08-14 NOTE — Discharge Instructions (Signed)
Pneumothorax  A pneumothorax, commonly called a collapsed lung, is a condition in which air leaks from a lung and builds up in the space between the lung and the chest wall (pleural space). The air in a pneumothorax is trapped outside the lung and takes up space, preventing the lung from fully expanding. This is a condition that usually occurs suddenly. The buildup of air may be small or large. A small pneumothorax may go away on its own. When a pneumothorax is larger, it will often require medical treatment and hospitalization. What are the causes? A pneumothorax can sometimes happen quickly with no apparent cause. People with underlying lung problems, particularly COPD or emphysema, are at higher risk of pneumothorax. However, pneumothorax can happen quickly even in people with no prior known lung problems. Trauma, surgery, medical procedures, or injury to the chest wall can also cause a pneumothorax. What are the signs or symptoms? Sometimes a pneumothorax will have no symptoms. When symptoms are present, they can include:  Chest pain.  Shortness of breath.  Increased rate of breathing.  Bluish color to your lips or skin (cyanosis). How is this diagnosed? Pneumothorax is usually diagnosed by a chest X-ray or chest CT scan. Your health care provider will also take a medical history and perform a physical exam to determine why you may have a pneumothorax. How is this treated? A small pneumothorax may go away on its own without treatment. Extra oxygen can sometimes help a small pneumothorax go away more quickly. For a larger pneumothorax or a pneumothorax that is causing symptoms, a procedure is usually needed to drain the air.In some cases, the health care provider may drain the air using a needle. In other cases, a chest tube may be inserted into the pleural space. A chest tube is a small tube placed between the ribs and into the pleural space. This removes the extra air and allows the lung to  expand back to its normal size. A large pneumothorax will usually require a hospital stay. If there is ongoing air leakage into the pleural space, then the chest tube may need to remain in place for several days until the air leak has healed. In some cases, surgery may be needed. Follow these instructions at home:  Only take over-the-counter or prescription medicines as directed by your health care provider.  If a cough or pain makes it difficult for you to sleep at night, try sleeping in a semi-upright position in a recliner or by using 2 or 3 pillows.  Rest and limit activity as directed by your health care provider.  If you had a chest tube and it was removed, ask your health care provider when it is okay to remove the dressing. Until your health care provider says you can remove the dressing, do not allow it to get wet.  Do not smoke. Smoking is a risk factor for pneumothorax.  Do not fly in an airplane or scuba dive until your health care provider says it is okay.  Follow up with your health care provider as directed. Get help right away if:  You have increasing chest pain or shortness of breath.  You have a cough that is not controlled with suppressants.  You begin coughing up blood.  You have pain that is getting worse or is not controlled with medicines.  You cough up thick, discolored mucus (sputum) that is yellow to green in color.  You have redness, increasing pain, or discharge at the site where a  chest tube had been in place (if your pneumothorax was treated with a chest tube).  The site where your chest tube was located opens up.  You feel air coming out of the site where the chest tube was placed.  You have a fever or persistent symptoms for more than 2-3 days.  You have a fever and your symptoms suddenly get worse. This information is not intended to replace advice given to you by your health care provider. Make sure you discuss any questions you have with your  health care provider. Document Released: 05/18/2005 Document Revised: 10/24/2015 Document Reviewed: 10/11/2013 Elsevier Interactive Patient Education  2017 [employer].    Steps to Quit Smoking Smoking tobacco can be bad for your health. It can also affect almost every organ in your body. Smoking puts you and people around you at risk for many serious long-lasting (chronic) diseases. Quitting smoking is hard, but it is one of the best things that you can do for your health. It is never too late to quit. What are the benefits of quitting smoking? When you quit smoking, you lower your risk for getting serious diseases and conditions. They can include:  Lung cancer or lung disease.  Heart disease.  Stroke.  Heart attack.  Not being able to have children (infertility).  Weak bones (osteoporosis) and broken bones (fractures). If you have coughing, wheezing, and shortness of breath, those symptoms may get better when you quit. You may also get sick less often. If you are pregnant, quitting smoking can help to lower your chances of having a baby of low birth weight. What can I do to help me quit smoking? Talk with your doctor about what can help you quit smoking. Some things you can do (strategies) include:  Quitting smoking totally, instead of slowly cutting back how much you smoke over a period of time.  Going to in-person counseling. You are more likely to quit if you go to many counseling sessions.  Using resources and support systems, such as:  Online chats with a Social worker.  Phone quitlines.  Printed Furniture conservator/restorer.  Support groups or group counseling.  Text messaging programs.  Mobile phone apps or applications.  Taking medicines. Some of these medicines may have nicotine in them. If you are pregnant or breastfeeding, do not take any medicines to quit smoking unless your doctor says it is okay. Talk with your doctor about counseling or other things that can help  you. Talk with your doctor about using more than one strategy at the same time, such as taking medicines while you are also going to in-person counseling. This can help make quitting easier. What things can I do to make it easier to quit? Quitting smoking might feel very hard at first, but there is a lot that you can do to make it easier. Take these steps:  Talk to your family and friends. Ask them to support and encourage you.  Call phone quitlines, reach out to support groups, or work with a Social worker.  Ask people who smoke to not smoke around you.  Avoid places that make you want (trigger) to smoke, such as:  Bars.  Parties.  Smoke-break areas at work.  Spend time with people who do not smoke.  Lower the stress in your life. Stress can make you want to smoke. Try these things to help your stress:  Getting regular exercise.  Deep-breathing exercises.  Yoga.  Meditating.  Doing a body scan. To do this, close your  eyes, focus on one area of your body at a time from head to toe, and notice which parts of your body are tense. Try to relax the muscles in those areas.  Download or buy apps on your mobile phone or tablet that can help you stick to your quit plan. There are many free apps, such as QuitGuide from the [employer] Office manager for Disease Control and Prevention). You can find more support from smokefree.gov and other websites. This information is not intended to replace advice given to you by your health care provider. Make sure you discuss any questions you have with your health care provider. Document Released: 03/14/2009 Document Revised: 01/14/2016 Document Reviewed: 10/02/2014 Elsevier Interactive Patient Education  2017 [employer].

## 2016-08-14 NOTE — Care Management Note (Signed)
Case Management Note Marvetta Gibbons RN, BSN Unit 2W-Case Manager [phone]  Patient Details  Name: Benjamin Burke MRN: [id number] Date of Birth: 09/04/1960  Subjective/Objective:   Pt admitted with spont. pntx- chest tube placed                 Action/Plan: PTA pt lived at home- independent- anticipate return home- referral received for medication needs- spoke with pt at bedside- per pt he has both [facility] and Medicaid coverage and gets his meds with his Medicaid benefits- for $3-$4 copays- pt would not qualify for any medication assistance at this time. No further CM needs noted- will continue to follow.   Expected Discharge Date:  08/14/16               Expected Discharge Plan:  Home/Self Care  In-House Referral:     Discharge planning Services  CM Consult, Medication Assistance  Post Acute Care Choice:  NA Choice offered to:  NA  DME Arranged:  N/A DME Agency:  NA  HH Arranged:    [place] Agency:     Status of Service:  Completed, signed off  If discussed at [place] of Stay Meetings, dates discussed:  3/13, 3/15  Discharge Disposition: home/self care   Additional Comments:  08/14/16- 1000- Marvetta Gibbons RN, CM- pt for d/c home today- chest tubes have been removed- No further CM needs noted for discharge.   Dawayne Patricia, RN 08/14/2016, 10:00 AM

## 2016-08-21 ENCOUNTER — Other Ambulatory Visit: Payer: Self-pay | Admitting: Thoracic Surgery (Cardiothoracic Vascular Surgery)

## 2016-08-21 ENCOUNTER — Encounter (INDEPENDENT_AMBULATORY_CARE_PROVIDER_SITE_OTHER): Payer: Medicare HMO

## 2016-08-21 DIAGNOSIS — J9383 Other pneumothorax: Secondary | ICD-10-CM

## 2016-08-21 DIAGNOSIS — Z4802 Encounter for removal of sutures: Secondary | ICD-10-CM

## 2016-08-25 ENCOUNTER — Ambulatory Visit (INDEPENDENT_AMBULATORY_CARE_PROVIDER_SITE_OTHER): Payer: Medicare HMO | Admitting: Thoracic Surgery (Cardiothoracic Vascular Surgery)

## 2016-08-25 ENCOUNTER — Encounter: Payer: Self-pay | Admitting: Thoracic Surgery (Cardiothoracic Vascular Surgery)

## 2016-08-25 ENCOUNTER — Ambulatory Visit
Admission: RE | Admit: 2016-08-25 | Discharge: 2016-08-25 | Disposition: A | Discharge status: Home / Self Care | Payer: Medicare HMO | Source: Ambulatory Visit | Attending: Thoracic Surgery (Cardiothoracic Vascular Surgery) | Admitting: Thoracic Surgery (Cardiothoracic Vascular Surgery)

## 2016-08-25 VITALS — BP 136/87 | HR 82 | Resp 16 | Ht 67.0 in | Wt 178.0 lb

## 2016-08-25 DIAGNOSIS — J9383 Other pneumothorax: Secondary | ICD-10-CM

## 2016-08-25 DIAGNOSIS — J939 Pneumothorax, unspecified: Secondary | ICD-10-CM | POA: Diagnosis not present

## 2016-08-25 NOTE — Patient Instructions (Signed)
Continue all previous medications without any changes at this time  No heavy lifting or strenuous exertion for at least 2 weeks  Stop smoking immediately and permanently.

## 2016-08-25 NOTE — Progress Notes (Signed)
[address]       [place] [address]             [phone]     CARDIOTHORACIC SURGERY OFFICE NOTE  Referring Provider is Lacretia Leigh, MD PCP is Vicenta Aly, FNP   HPI:  Patient is a 56 year old male with history of COPD, long-standing tobacco abuse, hypertension, and hyperlipidemia with recent history of acute bronchitis who presented to the hospital on 08/07/2016 with right primary spontaneous pneumothorax. He was treated with chest tube placement and ultimately was discharged from the hospital on 08/14/2016. While he was in the hospital he was caught using cocaine in his room. He will fully gave his stash of the illegal substance to hospital security. He was referred to the medical service to arrange for long-term follow-up regarding substance abuse.  He was also counseled at length regarding how important it is for him to find a way to quit smoking. Since hospital discharge he has done fine. He returns to our office today for routine follow-up. He admits that he has gone back to smoking but he is now only smoking one half pack of cigarettes daily.  He has mild residual soreness in the side of the chest where the chest tube was. He has no shortness of breath. His cough has cleared up.   Current Outpatient Prescriptions  Medication Sig Dispense Refill  . albuterol (PROAIR HFA) 108 (90 Base) MCG/ACT inhaler Inhale 2 puffs into the lungs every 6 (six) hours as needed for wheezing or shortness of breath.    Marland Kitchen aspirin EC 81 MG tablet Take 162 mg by mouth daily as needed (pain).    Marland Kitchen atorvastatin (LIPITOR) 40 MG tablet Take 40 mg by mouth at bedtime.    Marland Kitchen losartan (COZAAR) 50 MG tablet Take 1 tablet (50 mg total) by mouth daily. 30 tablet 1  . nicotine (NICODERM CQ - DOSED IN MG/24 HOURS) 21 mg/24hr patch Place 1 patch (21 mg total) onto the skin daily. 28 patch 0   No current facility-administered medications for this visit.       Physical Exam:   BP  136/87 (BP Location: Right Arm, Patient Position: Sitting, Cuff Size: Large)   Pulse 82   Resp 16   Ht 5\' 7"  (1.702 m)   Wt 178 lb (80.7 kg)   SpO2 95% Comment: ON RA  BMI 27.88 kg/m   General:  Well-appearing  Chest:   Clear to auscultation with symmetrical breath sounds  CV:   Regular rate and rhythm  Incisions:  Healing nicely  Abdomen:  Soft nontender  Extremities:  Warm and well-perfused  Diagnostic Tests:  CHEST  2 VIEW  COMPARISON:  08/14/2016  FINDINGS: No recurrent pneumothorax noted. Improved atelectasis on the right. Emphysema. Normal heart size and mediastinal contours.  IMPRESSION: 1. No residual or recurrent pneumothorax. Improved atelectasis on the right. 2. Emphysema.   Electronically Signed   By: Monte Fantasia M.D.   On: 08/25/2016 14:03   Impression:  Patient is doing well following recent hospital admission and treatment for primary spontaneous pneumothorax. I have personally reviewed his follow-up chest x-ray that was performed earlier today. There is no residual pneumothorax and his lung fields were clear.  Plan:  I have instructed the patient to continue to increase his physical activity but to avoid any sort of heavy lifting or strenuous physical exertion for approximately 2 weeks. I have strongly counseled him regarding how important it will be for  him to find a way to quit smoking completely. We've also discussed how other forms of substance abuse must be stop completely.  We discussed the fact that there remains at least a 20% chance that he might experience recurrent spontaneous pneumothorax at some point in the future. He understands the signs and symptoms to be mindful of. All of his questions been addressed. In the future he will call and return to see [place] only should specific problems or questions arise.  I spent in excess of 10 minutes during the conduct of this office consultation and >50% of this time involved direct face-to-face  encounter with the patient for counseling and/or coordination of their care.    Valentina Gu. Roxy Manns, MD 08/25/2016 2:33 PM

## 2018-02-25 ENCOUNTER — Emergency Department (HOSPITAL_COMMUNITY): Payer: Medicare HMO

## 2018-02-25 ENCOUNTER — Other Ambulatory Visit: Payer: Self-pay

## 2018-02-25 ENCOUNTER — Encounter (HOSPITAL_COMMUNITY): Payer: Self-pay | Admitting: *Deleted

## 2018-02-25 ENCOUNTER — Inpatient Hospital Stay (HOSPITAL_COMMUNITY)
Admission: EM | Admit: 2018-02-25 | Discharge: 2018-03-04 | DRG: 164 | Disposition: A | Discharge status: Home / Self Care | Payer: Medicare HMO | Attending: Thoracic Surgery (Cardiothoracic Vascular Surgery) | Admitting: Thoracic Surgery (Cardiothoracic Vascular Surgery)

## 2018-02-25 DIAGNOSIS — Z886 Allergy status to analgesic agent status: Secondary | ICD-10-CM

## 2018-02-25 DIAGNOSIS — R079 Chest pain, unspecified: Secondary | ICD-10-CM

## 2018-02-25 DIAGNOSIS — J441 Chronic obstructive pulmonary disease with (acute) exacerbation: Principal | ICD-10-CM | POA: Diagnosis present

## 2018-02-25 DIAGNOSIS — Z4682 Encounter for fitting and adjustment of non-vascular catheter: Secondary | ICD-10-CM

## 2018-02-25 DIAGNOSIS — Y636 Underdosing and nonadministration of necessary drug, medicament or biological substance: Secondary | ICD-10-CM | POA: Diagnosis present

## 2018-02-25 DIAGNOSIS — J9383 Other pneumothorax: Secondary | ICD-10-CM

## 2018-02-25 DIAGNOSIS — Z7982 Long term (current) use of aspirin: Secondary | ICD-10-CM

## 2018-02-25 DIAGNOSIS — E876 Hypokalemia: Secondary | ICD-10-CM | POA: Diagnosis present

## 2018-02-25 DIAGNOSIS — F17210 Nicotine dependence, cigarettes, uncomplicated: Secondary | ICD-10-CM | POA: Diagnosis present

## 2018-02-25 DIAGNOSIS — Z79899 Other long term (current) drug therapy: Secondary | ICD-10-CM

## 2018-02-25 DIAGNOSIS — Z7151 Drug abuse counseling and surveillance of drug abuser: Secondary | ICD-10-CM

## 2018-02-25 DIAGNOSIS — J9312 Secondary spontaneous pneumothorax: Secondary | ICD-10-CM

## 2018-02-25 DIAGNOSIS — J439 Emphysema, unspecified: Secondary | ICD-10-CM

## 2018-02-25 DIAGNOSIS — I160 Hypertensive urgency: Secondary | ICD-10-CM

## 2018-02-25 DIAGNOSIS — E785 Hyperlipidemia, unspecified: Secondary | ICD-10-CM | POA: Diagnosis present

## 2018-02-25 DIAGNOSIS — J939 Pneumothorax, unspecified: Secondary | ICD-10-CM

## 2018-02-25 DIAGNOSIS — Z823 Family history of stroke: Secondary | ICD-10-CM

## 2018-02-25 DIAGNOSIS — F1410 Cocaine abuse, uncomplicated: Secondary | ICD-10-CM

## 2018-02-25 DIAGNOSIS — Z8249 Family history of ischemic heart disease and other diseases of the circulatory system: Secondary | ICD-10-CM

## 2018-02-25 DIAGNOSIS — J9382 Other air leak: Secondary | ICD-10-CM | POA: Diagnosis not present

## 2018-02-25 DIAGNOSIS — J449 Chronic obstructive pulmonary disease, unspecified: Secondary | ICD-10-CM

## 2018-02-25 DIAGNOSIS — G8929 Other chronic pain: Secondary | ICD-10-CM | POA: Diagnosis present

## 2018-02-25 DIAGNOSIS — Z8349 Family history of other endocrine, nutritional and metabolic diseases: Secondary | ICD-10-CM

## 2018-02-25 LAB — BASIC METABOLIC PANEL
Anion gap: 11 (ref 5–15)
BUN: 8 mg/dL (ref 6–20)
CO2: 26 mmol/L (ref 22–32)
Calcium: 9.4 mg/dL (ref 8.9–10.3)
Chloride: 103 mmol/L (ref 98–111)
Creatinine, Ser: 1.16 mg/dL (ref 0.61–1.24)
GFR calc Af Amer: >60 mL/min (ref >60)
GFR calc non Af Amer: >60 mL/min (ref >60)
Glucose, Bld: 99 mg/dL (ref 70–99)
Potassium: 3.2 mmol/L — ABNORMAL LOW (ref 3.5–5.1)
Sodium: 140 mmol/L (ref 135–145)

## 2018-02-25 LAB — CBC
HCT: 47.4 % (ref 39.0–52.0)
Hemoglobin: 15.5 g/dL (ref 13.0–17.0)
MCH: 30.2 pg (ref 26.0–34.0)
MCHC: 32.7 g/dL (ref 30.0–36.0)
MCV: 92.2 fL (ref 78.0–100.0)
Platelets: 323 10*3/uL (ref 150–400)
RBC: 5.14 MIL/uL (ref 4.22–5.81)
RDW: 13.7 % (ref 11.5–15.5)
WBC: 10.4 10*3/uL (ref 4.0–10.5)

## 2018-02-25 LAB — TROPONIN I: Troponin I: <0.03 ng/mL (ref <0.03)

## 2018-02-25 MED ORDER — ALBUTEROL SULFATE (2.5 MG/3ML) 0.083% IN NEBU
5.0000 mg | INHALATION_SOLUTION | Freq: Once | RESPIRATORY_TRACT | Status: AC
  Administered 2018-02-25: 5 mg via RESPIRATORY_TRACT
  Filled 2018-02-25: qty 6

## 2018-02-25 MED ORDER — IPRATROPIUM BROMIDE 0.02 % IN SOLN
0.5000 mg | Freq: Once | RESPIRATORY_TRACT | Status: AC
  Administered 2018-02-25: 0.5 mg via RESPIRATORY_TRACT
  Filled 2018-02-25: qty 2.5

## 2018-02-25 NOTE — ED Triage Notes (Signed)
The pt is c/o a cough and chest pain. Non-productive. For 2 days hx copd  No chest pain unless he coughs

## 2018-02-26 ENCOUNTER — Encounter (HOSPITAL_COMMUNITY): Payer: Self-pay | Admitting: Emergency Medicine

## 2018-02-26 ENCOUNTER — Observation Stay (HOSPITAL_COMMUNITY): Payer: Medicare HMO

## 2018-02-26 DIAGNOSIS — Z8249 Family history of ischemic heart disease and other diseases of the circulatory system: Secondary | ICD-10-CM | POA: Diagnosis not present

## 2018-02-26 DIAGNOSIS — J441 Chronic obstructive pulmonary disease with (acute) exacerbation: Secondary | ICD-10-CM | POA: Diagnosis present

## 2018-02-26 DIAGNOSIS — J939 Pneumothorax, unspecified: Secondary | ICD-10-CM | POA: Diagnosis present

## 2018-02-26 DIAGNOSIS — J9312 Secondary spontaneous pneumothorax: Secondary | ICD-10-CM | POA: Diagnosis present

## 2018-02-26 DIAGNOSIS — E785 Hyperlipidemia, unspecified: Secondary | ICD-10-CM | POA: Diagnosis present

## 2018-02-26 DIAGNOSIS — I10 Essential (primary) hypertension: Secondary | ICD-10-CM | POA: Diagnosis not present

## 2018-02-26 DIAGNOSIS — I160 Hypertensive urgency: Secondary | ICD-10-CM | POA: Diagnosis present

## 2018-02-26 DIAGNOSIS — Z7151 Drug abuse counseling and surveillance of drug abuser: Secondary | ICD-10-CM | POA: Diagnosis not present

## 2018-02-26 DIAGNOSIS — Z8349 Family history of other endocrine, nutritional and metabolic diseases: Secondary | ICD-10-CM | POA: Diagnosis not present

## 2018-02-26 DIAGNOSIS — E876 Hypokalemia: Secondary | ICD-10-CM | POA: Diagnosis present

## 2018-02-26 DIAGNOSIS — Z79899 Other long term (current) drug therapy: Secondary | ICD-10-CM | POA: Diagnosis not present

## 2018-02-26 DIAGNOSIS — F1410 Cocaine abuse, uncomplicated: Secondary | ICD-10-CM | POA: Diagnosis present

## 2018-02-26 DIAGNOSIS — Z823 Family history of stroke: Secondary | ICD-10-CM | POA: Diagnosis not present

## 2018-02-26 DIAGNOSIS — J9383 Other pneumothorax: Secondary | ICD-10-CM | POA: Diagnosis not present

## 2018-02-26 DIAGNOSIS — J9311 Primary spontaneous pneumothorax: Secondary | ICD-10-CM | POA: Diagnosis not present

## 2018-02-26 DIAGNOSIS — G8929 Other chronic pain: Secondary | ICD-10-CM | POA: Diagnosis present

## 2018-02-26 DIAGNOSIS — F17210 Nicotine dependence, cigarettes, uncomplicated: Secondary | ICD-10-CM | POA: Diagnosis present

## 2018-02-26 DIAGNOSIS — Z886 Allergy status to analgesic agent status: Secondary | ICD-10-CM | POA: Diagnosis not present

## 2018-02-26 DIAGNOSIS — Y636 Underdosing and nonadministration of necessary drug, medicament or biological substance: Secondary | ICD-10-CM | POA: Diagnosis present

## 2018-02-26 DIAGNOSIS — J449 Chronic obstructive pulmonary disease, unspecified: Secondary | ICD-10-CM | POA: Diagnosis not present

## 2018-02-26 DIAGNOSIS — J9382 Other air leak: Secondary | ICD-10-CM | POA: Diagnosis not present

## 2018-02-26 DIAGNOSIS — Z7982 Long term (current) use of aspirin: Secondary | ICD-10-CM | POA: Diagnosis not present

## 2018-02-26 LAB — CBC
HCT: 44.4 % (ref 39.0–52.0)
Hemoglobin: 14.8 g/dL (ref 13.0–17.0)
MCH: 30.8 pg (ref 26.0–34.0)
MCHC: 33.3 g/dL (ref 30.0–36.0)
MCV: 92.3 fL (ref 78.0–100.0)
Platelets: 287 10*3/uL (ref 150–400)
RBC: 4.81 MIL/uL (ref 4.22–5.81)
RDW: 13.6 % (ref 11.5–15.5)
WBC: 7.7 10*3/uL (ref 4.0–10.5)

## 2018-02-26 LAB — BASIC METABOLIC PANEL
Anion gap: 9 (ref 5–15)
BUN: 7 mg/dL (ref 6–20)
CO2: 26 mmol/L (ref 22–32)
Calcium: 8.6 mg/dL — ABNORMAL LOW (ref 8.9–10.3)
Chloride: 103 mmol/L (ref 98–111)
Creatinine, Ser: 1.08 mg/dL (ref 0.61–1.24)
GFR calc Af Amer: >60 mL/min (ref >60)
GFR calc non Af Amer: >60 mL/min (ref >60)
Glucose, Bld: 109 mg/dL — ABNORMAL HIGH (ref 70–99)
Potassium: 2.9 mmol/L — ABNORMAL LOW (ref 3.5–5.1)
Sodium: 138 mmol/L (ref 135–145)

## 2018-02-26 LAB — TYPE AND SCREEN
ABO/RH(D): O NEG
Antibody Screen: NEGATIVE

## 2018-02-26 LAB — HEPATIC FUNCTION PANEL
ALT: 17 U/L (ref 0–44)
AST: 19 U/L (ref 15–41)
Albumin: 3.6 g/dL (ref 3.5–5.0)
Alkaline Phosphatase: 85 U/L (ref 38–126)
Bilirubin, Direct: 0.2 mg/dL (ref 0.0–0.2)
Indirect Bilirubin: 0.7 mg/dL (ref 0.3–0.9)
Total Bilirubin: 0.9 mg/dL (ref 0.3–1.2)
Total Protein: 6.5 g/dL (ref 6.5–8.1)

## 2018-02-26 LAB — SURGICAL PCR SCREEN
MRSA, PCR: NEGATIVE
Staphylococcus aureus: POSITIVE — AB

## 2018-02-26 LAB — RAPID URINE DRUG SCREEN, HOSP PERFORMED
Amphetamines: NOT DETECTED
Barbiturates: NOT DETECTED
Benzodiazepines: NOT DETECTED
Cocaine: POSITIVE — AB
Opiates: NOT DETECTED
Tetrahydrocannabinol: NOT DETECTED

## 2018-02-26 LAB — PROTIME-INR
INR: 0.9
Prothrombin Time: 12.1 seconds (ref 11.4–15.2)

## 2018-02-26 LAB — MRSA PCR SCREENING: MRSA by PCR: NEGATIVE

## 2018-02-26 LAB — HIV ANTIBODY (ROUTINE TESTING W REFLEX): HIV Screen 4th Generation wRfx: NONREACTIVE

## 2018-02-26 LAB — APTT: aPTT: 31 seconds (ref 24–36)

## 2018-02-26 MED ORDER — TRAMADOL HCL 50 MG PO TABS: 50.0000 mg | ORAL_TABLET | Freq: Four times a day (QID) | ORAL | Status: DC | PRN

## 2018-02-26 MED ORDER — LOSARTAN POTASSIUM 50 MG PO TABS
50.0000 mg | ORAL_TABLET | Freq: Every day | ORAL | Status: DC
  Administered 2018-02-26: 50 mg via ORAL
  Filled 2018-02-26 (×2): qty 1

## 2018-02-26 MED ORDER — AMLODIPINE BESYLATE 5 MG PO TABS
5.0000 mg | ORAL_TABLET | Freq: Every day | ORAL | Status: DC
  Administered 2018-02-26: 5 mg via ORAL
  Filled 2018-02-26: qty 1

## 2018-02-26 MED ORDER — HYDRALAZINE HCL 20 MG/ML IJ SOLN
5.0000 mg | Freq: Once | INTRAMUSCULAR | Status: AC
  Administered 2018-02-26: 5 mg via INTRAVENOUS
  Filled 2018-02-26: qty 1

## 2018-02-26 MED ORDER — IPRATROPIUM-ALBUTEROL 0.5-2.5 (3) MG/3ML IN SOLN
3.0000 mL | Freq: Four times a day (QID) | RESPIRATORY_TRACT | Status: DC
  Administered 2018-02-26: 3 mL via RESPIRATORY_TRACT
  Filled 2018-02-26: qty 3

## 2018-02-26 MED ORDER — ALBUTEROL SULFATE (2.5 MG/3ML) 0.083% IN NEBU
2.5000 mg | INHALATION_SOLUTION | Freq: Four times a day (QID) | RESPIRATORY_TRACT | Status: DC
  Filled 2018-02-26: qty 3

## 2018-02-26 MED ORDER — IPRATROPIUM BROMIDE 0.02 % IN SOLN
0.5000 mg | Freq: Four times a day (QID) | RESPIRATORY_TRACT | Status: DC
  Filled 2018-02-26: qty 2.5

## 2018-02-26 MED ORDER — NICOTINE 21 MG/24HR TD PT24
21.0000 mg | MEDICATED_PATCH | Freq: Once | TRANSDERMAL | Status: AC
  Administered 2018-02-26: 21 mg via TRANSDERMAL
  Filled 2018-02-26: qty 1

## 2018-02-26 MED ORDER — POTASSIUM CHLORIDE CRYS ER 20 MEQ PO TBCR
40.0000 meq | EXTENDED_RELEASE_TABLET | Freq: Once | ORAL | Status: AC
  Administered 2018-02-26: 40 meq via ORAL
  Filled 2018-02-26: qty 2

## 2018-02-26 MED ORDER — IBUPROFEN 200 MG PO TABS: 200.0000 mg | ORAL_TABLET | Freq: Four times a day (QID) | ORAL | Status: DC | PRN

## 2018-02-26 MED ORDER — CEFAZOLIN SODIUM-DEXTROSE 2-4 GM/100ML-% IV SOLN
2.0000 g | INTRAVENOUS | Status: AC
  Administered 2018-02-27: 2 g via INTRAVENOUS
  Filled 2018-02-26: qty 100

## 2018-02-26 MED ORDER — ONDANSETRON HCL 4 MG/2ML IJ SOLN: 4.0000 mg | Freq: Four times a day (QID) | INTRAMUSCULAR | Status: DC | PRN

## 2018-02-26 MED ORDER — OXYCODONE HCL 5 MG PO TABS
5.0000 mg | ORAL_TABLET | ORAL | Status: DC | PRN
  Administered 2018-02-26: 10 mg via ORAL
  Filled 2018-02-26: qty 2

## 2018-02-26 MED ORDER — KETOROLAC TROMETHAMINE 30 MG/ML IJ SOLN
30.0000 mg | Freq: Once | INTRAMUSCULAR | Status: AC
  Administered 2018-02-26: 30 mg via INTRAVENOUS
  Filled 2018-02-26: qty 1

## 2018-02-26 MED ORDER — POTASSIUM CHLORIDE CRYS ER 20 MEQ PO TBCR
40.0000 meq | EXTENDED_RELEASE_TABLET | Freq: Two times a day (BID) | ORAL | Status: AC
  Administered 2018-02-26 (×2): 40 meq via ORAL
  Filled 2018-02-26 (×2): qty 2

## 2018-02-26 MED ORDER — ALBUTEROL SULFATE (2.5 MG/3ML) 0.083% IN NEBU
2.5000 mg | INHALATION_SOLUTION | RESPIRATORY_TRACT | Status: DC | PRN
  Administered 2018-02-26: 2.5 mg via RESPIRATORY_TRACT
  Filled 2018-02-26: qty 3

## 2018-02-26 MED ORDER — ONDANSETRON HCL 4 MG PO TABS: 4.0000 mg | ORAL_TABLET | Freq: Four times a day (QID) | ORAL | Status: DC | PRN

## 2018-02-26 NOTE — ED Notes (Signed)
Pt taken off NRB mask at this time and placed on 4L nasal cannula. Breathing equal and unlabored. Pt in no distress, reports chest pain is less severe.

## 2018-02-26 NOTE — ED Notes (Signed)
Pt returned from CT °

## 2018-02-26 NOTE — H&P (Signed)
History and Physical    Benjamin Burke PZW:[id number] DOB: Aug 01, 1960 DOA: 02/25/2018  PCP: Vicenta Aly, FNP  Patient coming from: Home.  Chief Complaint: Shortness of breath.  HPI: Benjamin Burke is a 57 y.o. male with history of COPD and polysubstance abuse who has had spontaneous pneumothorax last year around March 2018 had undergone chest tube placement at the time presents to the ER because of increasing shortness of breath over the last 2 days.  [name] because he had his son's wedding to attend.  Patient states during previous episode he had significant chest pain which at this time is very minimal and only during deep breaths.  Mostly on the right side.   ED Course: In the ER chest x-ray shows right-sided pneumothorax around 15 to 20%.  On-call cardiothoracic surgeon Dr. Roxan Hockey was consulted by ER physician with this time requested admission and repeat chest x-ray.  In addition patient's urine drug screen is positive for cocaine.  Review of Systems: As per HPI, rest all negative.   Past Medical History:  Diagnosis Date  . Chronic back pain   . Cocaine abuse ([place]) 08/09/2016   Patient caught using cocaine in his hospital room while an inpatient for spontaneous pneumothorax  . COPD (chronic obstructive pulmonary disease) ([place])   . Hypertension   . Poor dentition 09/27/2012  . Right spontaneous pneumothorax 08/07/2016  . Tobacco abuse 03/24/2012   Overview:  1 PPD x age 38.  Prior h/o smokeless tobacco but quit    Past Surgical History:  Procedure Laterality Date  . NO PAST SURGERIES       reports that he has been smoking cigarettes. He has been smoking about 1.50 packs per day. He has never used smokeless tobacco. He reports that he does not drink alcohol. His drug history is not on file.  Allergies  Allergen Reactions  . Tylenol [Acetaminophen] Nausea And Vomiting    Family History  Problem Relation Age of Onset  . Arthritis  Mother   . Diabetes Mother   . Hyperlipidemia Mother   . Hypertension Mother   . Stroke Mother   . Kidney disease Mother   . Cancer Father     Prior to Admission medications   Medication Sig Start Date End Date Taking? Authorizing Provider  albuterol (PROAIR HFA) 108 (90 Base) MCG/ACT inhaler Inhale 2 puffs into the lungs every 6 (six) hours as needed for wheezing or shortness of breath.   Yes [provider]  aspirin EC 81 MG tablet Take 162 mg by mouth daily as needed (pain).   Yes [provider]  nicotine (NICODERM CQ - DOSED IN MG/24 HOURS) 21 mg/24hr patch Place 1 patch (21 mg total) onto the skin daily. Patient taking differently: Place 21 mg onto the skin daily as needed (when in hospital stays).  08/14/16  Yes Conte, Tessa N, PA-C  losartan (COZAAR) 50 MG tablet Take 1 tablet (50 mg total) by mouth daily. Patient not taking: Reported on 02/26/2018 08/14/16   Elgie Collard, [place]    Physical Exam: Vitals:   02/25/18 2212 02/25/18 2220 02/26/18 0045 02/26/18 0302  BP: (!) 193/118  (!) 199/107 (!) 185/106  Pulse: (!) 121  (!) 105 94  Resp: 18  19 (!) 26  Temp: 98.2 F (36.8 C)     TempSrc: Oral     SpO2: 94%  100% 99%  Weight: 77.1 kg 74.8 kg    Height: 5'  6" (1.676 m) 5\' 6"  (1.676 m)        Constitutional: Moderately built and nourished. Vitals:   02/25/18 2212 02/25/18 2220 02/26/18 0045 02/26/18 0302  BP: (!) 193/118  (!) 199/107 (!) 185/106  Pulse: (!) 121  (!) 105 94  Resp: 18  19 (!) 26  Temp: 98.2 F (36.8 C)     TempSrc: Oral     SpO2: 94%  100% 99%  Weight: 77.1 kg 74.8 kg    Height: 5\' 6"  (1.676 m) 5\' 6"  (1.676 m)     Eyes: Anicteric no pallor. ENMT: No discharge from the ears eyes nose or mouth. Neck: No mass felt.  No neck rigidity. Respiratory: No rhonchi or crepitations.  Decreased air entry on the right side. Cardiovascular: S1-S2 heard no murmurs appreciated. Abdomen: Soft nontender bowel sounds present. Musculoskeletal: No  edema.  No joint effusion. Skin: No rash. Neurologic: Alert awake oriented to time place and person.  Moves all extremities. Psychiatric: Appears normal per normal affect.   Labs on Admission: I have personally reviewed following labs and imaging studies  CBC: Recent Labs  Lab 02/25/18 2225  WBC 10.4  HGB 15.5  HCT 47.4  MCV 92.2  PLT 967   Basic Metabolic Panel: Recent Labs  Lab 02/25/18 2225  NA 140  K 3.2*  CL 103  CO2 26  GLUCOSE 99  BUN 8  CREATININE 1.16  CALCIUM 9.4   GFR: Estimated Creatinine Clearance: 64.9 mL/min (by C-G formula based on SCr of 1.16 mg/dL). Liver Function Tests: No results for input(s): AST, ALT, ALKPHOS, BILITOT, PROT, ALBUMIN in the last 168 hours. No results for input(s): LIPASE, AMYLASE in the last 168 hours. No results for input(s): AMMONIA in the last 168 hours. Coagulation Profile: No results for input(s): INR, PROTIME in the last 168 hours. Cardiac Enzymes: Recent Labs  Lab 02/25/18 2225  TROPONINI <0.03   BNP (last 3 results) No results for input(s): PROBNP in the last 8760 hours. HbA1C: No results for input(s): HGBA1C in the last 72 hours. CBG: No results for input(s): GLUCAP in the last 168 hours. Lipid Profile: No results for input(s): CHOL, HDL, LDLCALC, TRIG, CHOLHDL, LDLDIRECT in the last 72 hours. Thyroid Function Tests: No results for input(s): TSH, T4TOTAL, FREET4, T3FREE, THYROIDAB in the last 72 hours. Anemia Panel: No results for input(s): VITAMINB12, FOLATE, FERRITIN, TIBC, IRON, RETICCTPCT in the last 72 hours. Urine analysis: No results found for: COLORURINE, APPEARANCEUR, LABSPEC, PHURINE, GLUCOSEU, HGBUR, BILIRUBINUR, KETONESUR, PROTEINUR, UROBILINOGEN, NITRITE, LEUKOCYTESUR Sepsis Labs: @LABRCNTIP (procalcitonin:4,lacticidven:4) )No results found for this or any previous visit (from the past 240 hour(s)).   Radiological Exams on Admission: Dg Chest 2 View  Result Date: 02/25/2018 CLINICAL DATA:   Cough and right-sided chest pain, nonproductive. EXAM: CHEST - 2 VIEW COMPARISON:  08/25/2016 FINDINGS: New right anterolateral pneumothorax without tension measuring up to 3.5 cm at its base or approximately 15-20%. No pulmonary contusion, consolidation or effusion. No overt pulmonary edema. Heart and mediastinal contours are within normal limits. Visible fracture or suspicious osseous lesions. Mild aortic atherosclerosis is noted without aneurysm. IMPRESSION: Roughly 15-20% pneumothorax along the periphery of the right lung without tension. These results were called by telephone at the time of interpretation on 02/25/2018 at 11:21 pm to Dr. Varney Biles , who verbally acknowledged these results. Electronically Signed   By: Ashley Royalty M.D.   On: 02/25/2018 23:21    EKG: Independently reviewed.  Sinus tachycardia right axis deviation.  Assessment/Plan Active Problems:  Right spontaneous pneumothorax   Nicotine dependence, cigarettes, uncomplicated   Cocaine abuse ([place])   Pneumothorax   Hypertensive urgency    1. Right-sided spontaneous pneumothorax -patient has been placed on oxygen and nebulizer treatment for now.  Recheck x-ray in the morning.  Cardiothoracic surgery has been notified. 2. Tobacco abuse and cocaine abuse -we will get social work consult. 3. Hypertensive urgency -has history of hypertension and has not been taking his medications.  For now I have kept patient on PRN IV hydralazine since patient is kept n.p.o.  Follow blood pressure trends. 4. COPD on nebulizer treatment for now.   DVT prophylaxis: SCDs in anticipation of procedure. Code Status: Full code. Family Communication: Discussed with patient. Disposition Plan: Home. Consults called: Cardiothoracic surgery. Admission status: Observation.   Rise Patience MD [facility] Pager [phone].  If 7PM-7AM, please contact night-coverage [email or web address] Password [facility]  02/26/2018, 4:03 AM

## 2018-02-26 NOTE — ED Notes (Signed)
Pt reports he had a pneumo last year with chest tube x 2. Pt reports he was told he would have to have "another procedure" if pneumo reoccured.  Dr. Randal Buba aware.

## 2018-02-26 NOTE — Progress Notes (Signed)
Benjamin Burke [id number] Admission Data: 02/26/2018 5:18 PM Attending Provider: Cherene Altes, MD  PQZ:[id number], Benjamin Burke, Benjamin Burke Consults/ Treatment Team:   Benjamin Burke is a 57 y.o. male patient admitted from ED awake, alert  & orientated  X 3,  Full Code, VSS - Blood pressure (!) 155/98, pulse 83, temperature 98.5 F (36.9 C), temperature source Oral, resp. rate (!) 23, height 5\' 6"  (1.676 m), weight 75.5 kg, SpO2 97 %., O2    3 L nasal cannular, no c/o shortness of breath, no c/o chest pain, no distress noted. Tele # [id number] placed and pt is currently running:normal sinus rhythm.   IV site WDL:  antecubital left, condition patent and no redness with a transparent dsg that's clean dry and intact.  Allergies:   Allergies  Allergen Reactions  . Tylenol [Acetaminophen] Nausea And Vomiting     Past Medical History:  Diagnosis Date  . Chronic back pain   . Cocaine abuse ([place]) 08/09/2016   Patient caught using cocaine in his hospital room while an inpatient for spontaneous pneumothorax  . COPD (chronic obstructive pulmonary disease) ([place])   . Hypertension   . Poor dentition 09/27/2012  . Right spontaneous pneumothorax 08/07/2016  . Tobacco abuse 03/24/2012   Overview:  1 PPD x age 20.  Prior h/o smokeless tobacco but quit    History:  obtained from chart review and ED nurse. Tobacco/alcohol: Smoked 0 packs per day for ? years none  Pt orientation to unit, room and routine. Information packet given to patient/family and safety video watched.  Admission INP armband ID verified with patient/family, and in place. SR up x 2, fall risk assessment complete with Patient and family verbalizing understanding of risks associated with falls. Pt verbalizes an understanding of how to use the call bell and to call for help before getting out of bed.  Skin, clean-dry- intact without evidence of bruising, or skin tears.   No evidence of skin break down noted on exam. no rashes, no ecchymoses, no petechiae,  no nodules, no jaundice, no purpura, no wounds    Will cont to monitor and assist as needed.  Benjamin Slaughter, RN 02/26/2018 5:18 PM

## 2018-02-26 NOTE — Consult Note (Addendum)
Reason for Consult:Recurrent right pneumothorax Referring Physician: Dr. Nelly Rout is an 57 y.o. male.  HPI: 57 yo man with a history of tobacco abuse, COPD, right spontaneous pneumothorax, chronic pain, cocaine abuse who presented with a 3 day history of CP and SOB. He noted the onset of right sided CP and SOB after a coughing spell Wednesday, but did not come to hospital because his son was getting married on Friday. Came to ED last night. CXR showed a right spontaneous pneumothorax. Started on oxygen. Says he still feels wheezy but no longer having pain.  Past Medical History:  Diagnosis Date  . Chronic back pain   . Cocaine abuse ([place]) 08/09/2016   Patient caught using cocaine in his hospital room while an inpatient for spontaneous pneumothorax  . COPD (chronic obstructive pulmonary disease) ([place])   . Hypertension   . Poor dentition 09/27/2012  . Right spontaneous pneumothorax 08/07/2016  . Tobacco abuse 03/24/2012   Overview:  1 PPD x age 69.  Prior h/o smokeless tobacco but quit    Past Surgical History:  Procedure Laterality Date  . NO PAST SURGERIES      Family History  Problem Relation Age of Onset  . Arthritis Mother   . Diabetes Mother   . Hyperlipidemia Mother   . Hypertension Mother   . Stroke Mother   . Kidney disease Mother   . Cancer Father     Social History:  reports that he has been smoking cigarettes. He has been smoking about 1.50 packs per day. He has never used smokeless tobacco. He reports that he does not drink alcohol. His drug history is not on file.  Allergies:  Allergies  Allergen Reactions  . Tylenol [Acetaminophen] Nausea And Vomiting    Medications:  Scheduled: . amLODipine  5 mg Oral Daily  . nicotine  21 mg Transdermal Once  . potassium chloride  40 mEq Oral BID    Results for orders placed or performed during the hospital encounter of 02/25/18 (from the past 48 hour(s))  Basic metabolic panel     Status: Abnormal    Collection Time: 02/25/18 10:25 PM  Result Value Ref Range   Sodium 140 135 - 145 mmol/L   Potassium 3.2 (L) 3.5 - 5.1 mmol/L   Chloride 103 98 - 111 mmol/L   CO2 26 22 - 32 mmol/L   Glucose, Bld 99 70 - 99 mg/dL   BUN 8 6 - 20 mg/dL   Creatinine, Ser 1.16 0.61 - 1.24 mg/dL   Calcium 9.4 8.9 - 10.3 mg/dL   GFR calc non Af Amer >60 >60 mL/min   GFR calc Af Amer >60 >60 mL/min    Comment: (NOTE) The eGFR has been calculated using the CKD EPI equation. This calculation has not been validated in all clinical situations. eGFR's persistently <60 mL/min signify possible Chronic Kidney Disease.    Anion gap 11 5 - 15    Comment: Performed at [facility] [address] [place] [address]  CBC     Status: None   Collection Time: 02/25/18 10:25 PM  Result Value Ref Range   WBC 10.4 4.0 - 10.5 K/uL   RBC 5.14 4.22 - 5.81 MIL/uL   Hemoglobin 15.5 13.0 - 17.0 g/dL   HCT 47.4 39.0 - 52.0 %   MCV 92.2 78.0 - 100.0 fL   MCH 30.2 26.0 - 34.0 pg   MCHC 32.7 30.0 - 36.0 g/dL   RDW  13.7 11.5 - 15.5 %   Platelets 323 150 - 400 K/uL    Comment: Performed at [facility], [place] [address] [place], [place] [address]  Troponin I     Status: None   Collection Time: 02/25/18 10:25 PM  Result Value Ref Range   Troponin I <0.03 <0.03 ng/mL    Comment: Performed at [facility] [address], [place], [place] [address]  Rapid urine drug screen (hospital performed)     Status: Abnormal   Collection Time: 02/26/18  2:18 AM  Result Value Ref Range   Opiates NONE DETECTED NONE DETECTED   Cocaine POSITIVE (A) NONE DETECTED   Benzodiazepines NONE DETECTED NONE DETECTED   Amphetamines NONE DETECTED NONE DETECTED   Tetrahydrocannabinol NONE DETECTED NONE DETECTED   Barbiturates NONE DETECTED NONE DETECTED    Comment: (NOTE) DRUG SCREEN FOR MEDICAL PURPOSES ONLY.  IF CONFIRMATION IS NEEDED FOR ANY PURPOSE, NOTIFY LAB WITHIN 5 DAYS. LOWEST DETECTABLE LIMITS FOR URINE DRUG  SCREEN Drug Class                     Cutoff (ng/mL) Amphetamine and metabolites    1000 Barbiturate and metabolites    200 Benzodiazepine                 546 Tricyclics and metabolites     300 Opiates and metabolites        300 Cocaine and metabolites        300 THC                            50 Performed at [facility], [place] [address] [place],  [address]   HIV antibody (Routine Testing)     Status: None   Collection Time: 02/26/18  4:27 AM  Result Value Ref Range   HIV Screen 4th Generation wRfx Non Reactive Non Reactive    Comment: (NOTE) Performed At: [facility] [place], [place] [id number] Rush Farmer MD [id number]   Basic metabolic panel     Status: Abnormal   Collection Time: 02/26/18  4:27 AM  Result Value Ref Range   Sodium 138 135 - 145 mmol/L   Potassium 2.9 (L) 3.5 - 5.1 mmol/L   Chloride 103 98 - 111 mmol/L   CO2 26 22 - 32 mmol/L   Glucose, Bld 109 (H) 70 - 99 mg/dL   BUN 7 6 - 20 mg/dL   Creatinine, Ser 1.08 0.61 - 1.24 mg/dL   Calcium 8.6 (L) 8.9 - 10.3 mg/dL   GFR calc non Af Amer >60 >60 mL/min   GFR calc Af Amer >60 >60 mL/min    Comment: (NOTE) The eGFR has been calculated using the CKD EPI equation. This calculation has not been validated in all clinical situations. eGFR's persistently <60 mL/min signify possible Chronic Kidney Disease.    Anion gap 9 5 - 15    Comment: Performed at [facility] [address] [place] [address]  CBC     Status: None   Collection Time: 02/26/18  4:27 AM  Result Value Ref Range   WBC 7.7 4.0 - 10.5 K/uL   RBC 4.81 4.22 - 5.81 MIL/uL   Hemoglobin 14.8 13.0 - 17.0 g/dL   HCT 44.4 39.0 - 52.0 %   MCV 92.3 78.0 - 100.0 fL   MCH 30.8 26.0 - 34.0 pg  MCHC 33.3 30.0 - 36.0 g/dL   RDW 13.6 11.5 - 15.5 %   Platelets 287 150 - 400 K/uL    Comment: Performed at [facility], [place] [address] [place], [place] [address]  Hepatic function panel     Status:  None   Collection Time: 02/26/18  4:27 AM  Result Value Ref Range   Total Protein 6.5 6.5 - 8.1 g/dL   Albumin 3.6 3.5 - 5.0 g/dL   AST 19 15 - 41 U/L   ALT 17 0 - 44 U/L   Alkaline Phosphatase 85 38 - 126 U/L   Total Bilirubin 0.9 0.3 - 1.2 mg/dL   Bilirubin, Direct 0.2 0.0 - 0.2 mg/dL   Indirect Bilirubin 0.7 0.3 - 0.9 mg/dL    Comment: Performed at [facility] [address] [place], [place] [address]  MRSA PCR Screening     Status: None   Collection Time: 02/26/18 12:06 PM  Result Value Ref Range   MRSA by PCR NEGATIVE NEGATIVE    Comment:        The GeneXpert MRSA Assay (FDA approved for NASAL specimens only), is one component of a comprehensive MRSA colonization surveillance program. It is not intended to diagnose MRSA infection nor to guide or monitor treatment for MRSA infections. Performed at [facility], [place] [address], [place], [place] [address]     Dg Chest 2 View  Result Date: 02/26/2018 CLINICAL DATA:  Right-sided pneumothorax with chest pain EXAM: CHEST - 2 VIEW COMPARISON:  February 25, 2018 FINDINGS: The previously noted pneumothorax tracking along the right lateral thorax with apical and basilar components as well as lateral components appears essentially stable compared to 1 day prior. There is a small pleural effusion on the right as well. There is no tension component. There is atelectatic change in the right base. There is no edema or consolidation. Heart size and pulmonary vascularity are normal. No adenopathy. No bone lesions. IMPRESSION: Right-sided pneumothorax, stable from 1 day prior. No tension component. Small right pleural effusion as well. No edema or consolidation. Stable cardiac silhouette. Critical Value/emergent results were called by telephone at the time of interpretation on 02/26/2018 at 7:15 am to Dr. Jola Schmidt , who verbally acknowledged these results. Electronically Signed   By: Lowella Grip III M.D.   On: 02/26/2018  07:19   Dg Chest 2 View  Result Date: 02/25/2018 CLINICAL DATA:  Cough and right-sided chest pain, nonproductive. EXAM: CHEST - 2 VIEW COMPARISON:  08/25/2016 FINDINGS: New right anterolateral pneumothorax without tension measuring up to 3.5 cm at its base or approximately 15-20%. No pulmonary contusion, consolidation or effusion. No overt pulmonary edema. Heart and mediastinal contours are within normal limits. Visible fracture or suspicious osseous lesions. Mild aortic atherosclerosis is noted without aneurysm. IMPRESSION: Roughly 15-20% pneumothorax along the periphery of the right lung without tension. These results were called by telephone at the time of interpretation on 02/25/2018 at 11:21 pm to Dr. Varney Biles , who verbally acknowledged these results. Electronically Signed   By: Ashley Royalty M.D.   On: 02/25/2018 23:21    Review of Systems  Constitutional: Negative for fever.  Respiratory: Positive for cough and shortness of breath.   Cardiovascular: Positive for chest pain.  Musculoskeletal: Positive for back pain and joint pain.  Psychiatric/Behavioral:       Drug use   Blood pressure (!) 143/86, pulse 86, temperature 98.1 F (36.7 C), temperature source Oral, resp. rate  17, height _0  (1.676 m), weight 75.5 kg, SpO2 94 %. Physical Exam  Vitals reviewed. Constitutional: He is oriented to person, place, and time. No distress.  57 yo man appears older than stated age  HENT:  Head: Normocephalic and atraumatic.  Mouth/Throat: No oropharyngeal exudate.  Eyes: Conjunctivae and EOM are normal. No scleral icterus.  Neck: No tracheal deviation present. No thyromegaly present.  Cardiovascular: Normal rate, regular rhythm and normal heart sounds.  No murmur heard. Respiratory: No respiratory distress. He has wheezes. He has no rales.  Markedly diminished BS right base  GI: Soft. He exhibits no distension. There is no tenderness.  Musculoskeletal: He exhibits no edema.   Lymphadenopathy:    He has no cervical adenopathy.  Neurological: He is alert and oriented to person, place, and time. No cranial nerve deficit. He exhibits normal muscle tone.  Skin: Skin is warm and dry. He is not diaphoretic.    Assessment/Plan: 57 yo man with a past history of tobacco abuse, COPD and a previous right spontaneous pneumothorax presents with a recurrent pneumothorax. He is symptomatic but pain has improved and breathing is stable. Has not had to have a tube placed yet. Discussed options of chest tube placement v Right VATS for definitive treatment.  I recommended we proceed with right VATS for bleb resection and pleural abrasion tomorrow morning. I discussed the general nature of the procedure, the need for general anesthesia, the incisions to be used and the need for chest postoperatively with Mr and Mrs Ardoin. We discussed the expected hospital stay, overall recovery and short and long term outcomes. I informed them of the indications, risks, benefits and alternatives. They understand the risks include, but are not limited to death, stroke, MI, DVT/PE, bleeding, possible need for transfusion, infections, prolonged air leaks, cardiac arrhythmias. As wel;l as other organ system dysfunction including respiratory, renal, or GI complications.  He accepts the risks and agrees to proceed.  Melrose Nakayama 02/26/2018, 7:13 PM

## 2018-02-26 NOTE — ED Provider Notes (Addendum)
[facility] EMERGENCY DEPARTMENT Provider Note   CSN: [id number] Arrival date & time: 02/25/18  2206     History   Chief Complaint Chief Complaint  Patient presents with  . Chest Pain    HPI Benjamin Burke is a 57 y.o. male.  The history is provided by the patient.  Chest Pain   This is a recurrent problem. The current episode started more than 2 days ago. The problem occurs constantly. The problem has not changed since onset.The pain is associated with rest. The pain is present in the lateral region. The pain does not radiate. Duration of episode(s) is 2 days. Exacerbated by: coughing. Associated symptoms include cough and shortness of breath. Pertinent negatives include no diaphoresis, no fever, no sputum production and no syncope. He has tried nothing for the symptoms. The treatment provided no relief. Risk factors include smoking/tobacco exposure and male gender.  His past medical history is significant for COPD and spontaneous pneumothorax.  Pertinent negatives for family medical history include: no Marfan's syndrome.  Procedure history is negative for EPS study.  Chest pain x 2 days but waited to come in secondary to a family wedding.  No f/c/r. No DOE no exertional symptoms.  Patient states he was told by his surgeon that if symptoms ever returned he would need a procedure for his recurrent pneumothoraces.    Past Medical History:  Diagnosis Date  . Chronic back pain   . Cocaine abuse ([place]) 08/09/2016   Patient caught using cocaine in his hospital room while an inpatient for spontaneous pneumothorax  . COPD (chronic obstructive pulmonary disease) ([place])   . Hypertension   . Poor dentition 09/27/2012  . Right spontaneous pneumothorax 08/07/2016  . Tobacco abuse 03/24/2012   Overview:  1 PPD x age 74.  Prior h/o smokeless tobacco but quit    Patient Active Problem List   Diagnosis Date Noted  . Cocaine abuse ([place]) 08/09/2016  . Right spontaneous  pneumothorax 08/07/2016  . Bronchitis 08/07/2016  . Nicotine dependence, cigarettes, uncomplicated 85/88/5027  . Radiculopathy of lumbosacral region 09/12/2013  . Chronic bilateral low back pain without sciatica 09/12/2013  . COPD (chronic obstructive pulmonary disease) ([place]) 01/27/2013  . Hyperglycemia 01/24/2013  . Essential hypertension 11/21/2012  . Hyperlipidemia with target LDL less than 100 11/21/2012  . Poor dentition 09/27/2012  . Tobacco abuse 03/24/2012    Past Surgical History:  Procedure Laterality Date  . NO PAST SURGERIES          Home Medications    Prior to Admission medications   Medication Sig Start Date End Date Taking? Authorizing Provider  albuterol (PROAIR HFA) 108 (90 Base) MCG/ACT inhaler Inhale 2 puffs into the lungs every 6 (six) hours as needed for wheezing or shortness of breath.   Yes [provider]  aspirin EC 81 MG tablet Take 162 mg by mouth daily as needed (pain).   Yes [provider]  nicotine (NICODERM CQ - DOSED IN MG/24 HOURS) 21 mg/24hr patch Place 1 patch (21 mg total) onto the skin daily. Patient taking differently: Place 21 mg onto the skin daily as needed (when in hospital stays).  08/14/16  Yes Conte, Tessa N, PA-C  losartan (COZAAR) 50 MG tablet Take 1 tablet (50 mg total) by mouth daily. Patient not taking: Reported on 02/26/2018 08/14/16   Elgie Collard, PA-C    Family History Family History  Problem Relation Age of Onset  . Arthritis Mother   . Diabetes  Mother   . Hyperlipidemia Mother   . Hypertension Mother   . Stroke Mother   . Kidney disease Mother   . Cancer Father     Social History Social History   Tobacco Use  . Smoking status: Current Every Day Smoker    Packs/day: 1.50    Types: Cigarettes  . Smokeless tobacco: Never Used  Substance Use Topics  . Alcohol use: No    Alcohol/week: 0.0 standard drinks  . Drug use: Not on file     Allergies   Tylenol [acetaminophen]   Review of  Systems Review of Systems  Constitutional: Negative for diaphoresis and fever.  Respiratory: Positive for cough and shortness of breath. Negative for sputum production and wheezing.   Cardiovascular: Positive for chest pain. Negative for syncope.  All other systems reviewed and are negative.    Physical Exam Updated Vital Signs BP (!) 199/107   Pulse (!) 105   Temp 98.2 F (36.8 C) (Oral)   Resp 19   Ht 5\' 6"  (1.676 m)   Wt 74.8 kg   SpO2 100%   BMI 26.63 kg/m   Physical Exam  Constitutional: He is oriented to person, place, and time. He appears well-developed and well-nourished. No distress.  HENT:  Head: Normocephalic and atraumatic.  Mouth/Throat: No oropharyngeal exudate.  Eyes: Pupils are equal, round, and reactive to light. Conjunctivae are normal.  Neck: Normal range of motion. Neck supple.  Cardiovascular: Normal rate, regular rhythm, normal heart sounds and intact distal pulses.  Pulmonary/Chest: No respiratory distress. He has decreased breath sounds in the right upper field and the right lower field. He has no wheezes. He has no rales.  Abdominal: Soft. Bowel sounds are normal. He exhibits no mass. There is no tenderness. There is no rebound and no guarding.  Musculoskeletal: Normal range of motion.  Neurological: He is alert and oriented to person, place, and time. He displays normal reflexes.  Skin: Skin is warm and dry. Capillary refill takes less than 2 seconds.  Psychiatric: He has a normal mood and affect.     ED Treatments / Results  Labs (all labs ordered are listed, but only abnormal results are displayed) Results for orders placed or performed during the hospital encounter of 29/93/71  Basic metabolic panel  Result Value Ref Range   Sodium 140 135 - 145 mmol/L   Potassium 3.2 (L) 3.5 - 5.1 mmol/L   Chloride 103 98 - 111 mmol/L   CO2 26 22 - 32 mmol/L   Glucose, Bld 99 70 - 99 mg/dL   BUN 8 6 - 20 mg/dL   Creatinine, Ser 1.16 0.61 - 1.24 mg/dL    Calcium 9.4 8.9 - 10.3 mg/dL   GFR calc non Af Amer >60 >60 mL/min   GFR calc Af Amer >60 >60 mL/min   Anion gap 11 5 - 15  CBC  Result Value Ref Range   WBC 10.4 4.0 - 10.5 K/uL   RBC 5.14 4.22 - 5.81 MIL/uL   Hemoglobin 15.5 13.0 - 17.0 g/dL   HCT 47.4 39.0 - 52.0 %   MCV 92.2 78.0 - 100.0 fL   MCH 30.2 26.0 - 34.0 pg   MCHC 32.7 30.0 - 36.0 g/dL   RDW 13.7 11.5 - 15.5 %   Platelets 323 150 - 400 K/uL  Troponin I  Result Value Ref Range   Troponin I <0.03 <0.03 ng/mL  Rapid urine drug screen (hospital performed)  Result Value Ref Range  Opiates NONE DETECTED NONE DETECTED   Cocaine POSITIVE (A) NONE DETECTED   Benzodiazepines NONE DETECTED NONE DETECTED   Amphetamines NONE DETECTED NONE DETECTED   Tetrahydrocannabinol NONE DETECTED NONE DETECTED   Barbiturates NONE DETECTED NONE DETECTED   Dg Chest 2 View  Result Date: 02/25/2018 CLINICAL DATA:  Cough and right-sided chest pain, nonproductive. EXAM: CHEST - 2 VIEW COMPARISON:  08/25/2016 FINDINGS: New right anterolateral pneumothorax without tension measuring up to 3.5 cm at its base or approximately 15-20%. No pulmonary contusion, consolidation or effusion. No overt pulmonary edema. Heart and mediastinal contours are within normal limits. Visible fracture or suspicious osseous lesions. Mild aortic atherosclerosis is noted without aneurysm. IMPRESSION: Roughly 15-20% pneumothorax along the periphery of the right lung without tension. These results were called by telephone at the time of interpretation on 02/25/2018 at 11:21 pm to Dr. Varney Biles , who verbally acknowledged these results. Electronically Signed   By: Ashley Royalty M.D.   On: 02/25/2018 23:21    EKG EKG Interpretation  Date/Time:  Friday February 25 2018 22:07:40 EDT Ventricular Rate:  121 PR Interval:  142 QRS Duration: 86 QT Interval:  314 QTC Calculation: 445 R Axis:   115 Text Interpretation:  Sinus tachycardia Right axis deviation Confirmed by Randal Buba,  Mana Morison ([id number]) on 02/26/2018 1:39:03 AM   Radiology Dg Chest 2 View  Result Date: 02/25/2018 CLINICAL DATA:  Cough and right-sided chest pain, nonproductive. EXAM: CHEST - 2 VIEW COMPARISON:  08/25/2016 FINDINGS: New right anterolateral pneumothorax without tension measuring up to 3.5 cm at its base or approximately 15-20%. No pulmonary contusion, consolidation or effusion. No overt pulmonary edema. Heart and mediastinal contours are within normal limits. Visible fracture or suspicious osseous lesions. Mild aortic atherosclerosis is noted without aneurysm. IMPRESSION: Roughly 15-20% pneumothorax along the periphery of the right lung without tension. These results were called by telephone at the time of interpretation on 02/25/2018 at 11:21 pm to Dr. Varney Biles , who verbally acknowledged these results. Electronically Signed   By: Ashley Royalty M.D.   On: 02/25/2018 23:21    Procedures Procedures (including critical care time)  Medications Ordered in ED Medications  hydrALAZINE (APRESOLINE) injection 5 mg (has no administration in time range)  ketorolac (TORADOL) 30 MG/ML injection 30 mg (has no administration in time range)  albuterol (PROVENTIL) (2.5 MG/3ML) 0.083% nebulizer solution 5 mg (5 mg Nebulization Given 02/25/18 2231)  ipratropium (ATROVENT) nebulizer solution 0.5 mg (0.5 mg Nebulization Given 02/25/18 2231)     Case d/w Dr. Roxan Hockey of thoracic surgery.  Place in observation and recheck CXR in am post NRB, if not improved call back  Final Clinical Impressions(s) / ED Diagnoses   Final diagnoses:  Chronic obstructive pulmonary disease, unspecified COPD type ([place])  Secondary spontaneous pneumothorax  Chest pain, unspecified type    Will admit for CP rule out and observation of 15% PTX seen on cxr, recurrent. Patient is hemodynamically stable and does not require emergent chest tube at this time.  Will need repeat CXR in am.  If still present CT surgery will need to be  consulted.      Charlett Merkle, MD 02/26/18 [place], Nicholous Girgenti, MD 03/07/18 1505

## 2018-02-26 NOTE — ED Notes (Signed)
Pt placed on NRB per Dr. Randal Buba. Pt reports cough x 2 days.

## 2018-02-26 NOTE — Progress Notes (Signed)
  [facility] TEAM 1 - Stepdown/ICU TEAM  RAMIZ TURPIN  PJA:[id number] DOB: 11-24-1960 DOA: 02/25/2018 PCP: Vicenta Aly, FNP    Brief Narrative:  57 y.o. male w/ hx of COPD, polysubstance abuse, and a spontaneous R pneumothorax March 2018 who presented w/ 2 days of SOB.  [name] because he had his son's wedding to attend.  Patient states during previous episode he had significant chest pain which at this time is very minimal and only during deep breaths.  Mostly on the right side.   In the ED a CXR revealed a R pneumothorax at 15 to 20%.  TCTS was consulted.   Significant Events: 9/28 admit   Subjective: Pt is seen for a f/u visit.    Assessment & Plan:  Recurrent spontaneous R pneumothorax Discussed w/ TCTS who I have asked to see on an non-emergent basis - cont O2 support - f/u CXR in AM   Cocaine abuse  Severe COPD - ongoing tobacco abuse Does not require home O2 at this point   Hypokalemia  Replace and follow  HTN Begin CCB - intolerant to ARB (makes me feel dizzy)  DVT prophylaxis: SCDs Code Status: FULL CODE Family Communication:  Disposition Plan:   Consultants:  TCTS  Antimicrobials:  none  Objective: Blood pressure (!) 147/100, pulse 74, temperature 98.2 F (36.8 C), temperature source Oral, resp. rate 15, height 5\' 6"  (1.676 m), weight 74.8 kg, SpO2 100 %. No intake or output data in the 24 hours ending 02/26/18 0827 Filed Weights   02/25/18 2212 02/25/18 2220  Weight: 77.1 kg 74.8 kg    Examination: Pt was seen for a f/u visit.    CBC: Recent Labs  Lab 02/25/18 2225 02/26/18 0427  WBC 10.4 7.7  HGB 15.5 14.8  HCT 47.4 44.4  MCV 92.2 92.3  PLT 323 341   Basic Metabolic Panel: Recent Labs  Lab 02/25/18 2225 02/26/18 0427  NA 140 138  K 3.2* 2.9*  CL 103 103  CO2 26 26  GLUCOSE 99 109*  BUN 8 7  CREATININE 1.16 1.08  CALCIUM 9.4 8.6*   GFR: Estimated Creatinine Clearance: 69.7 mL/min (by C-G  formula based on SCr of 1.08 mg/dL).  Liver Function Tests: Recent Labs  Lab 02/26/18 0427  AST 19  ALT 17  ALKPHOS 85  BILITOT 0.9  PROT 6.5  ALBUMIN 3.6    Cardiac Enzymes: Recent Labs  Lab 02/25/18 2225  TROPONINI <0.03    Scheduled Meds: . ipratropium-albuterol  3 mL Nebulization Q6H  . nicotine  21 mg Transdermal Once     LOS: 0 days   Time spent: No Charge  Cherene Altes, MD [facility] Office  [phone] Pager - Text Page per Amion as per below:  On-Call/Text Page:      [email or web address]  If 7PM-7AM, please contact night-coverage [email or web address] 02/26/2018, 8:27 AM

## 2018-02-27 ENCOUNTER — Encounter (HOSPITAL_COMMUNITY)
Admission: EM | Disposition: A | Discharge status: Home / Self Care | Payer: Self-pay | Source: Home / Self Care | Attending: Thoracic Surgery (Cardiothoracic Vascular Surgery)

## 2018-02-27 ENCOUNTER — Other Ambulatory Visit: Payer: Self-pay

## 2018-02-27 ENCOUNTER — Inpatient Hospital Stay (HOSPITAL_COMMUNITY): Payer: Medicare HMO | Admitting: Anesthesiology

## 2018-02-27 ENCOUNTER — Inpatient Hospital Stay (HOSPITAL_COMMUNITY): Payer: Medicare HMO

## 2018-02-27 DIAGNOSIS — J439 Emphysema, unspecified: Secondary | ICD-10-CM | POA: Diagnosis present

## 2018-02-27 DIAGNOSIS — J9311 Primary spontaneous pneumothorax: Secondary | ICD-10-CM

## 2018-02-27 HISTORY — PX: VIDEO ASSISTED THORACOSCOPY (VATS)/WEDGE RESECTION: SHX6174

## 2018-02-27 LAB — GLUCOSE, CAPILLARY
Glucose-Capillary: 135 mg/dL — ABNORMAL HIGH (ref 70–99)
Glucose-Capillary: 207 mg/dL — ABNORMAL HIGH (ref 70–99)

## 2018-02-27 LAB — URINALYSIS, ROUTINE W REFLEX MICROSCOPIC
Bilirubin Urine: NEGATIVE
Glucose, UA: NEGATIVE mg/dL
Hgb urine dipstick: NEGATIVE
Ketones, ur: NEGATIVE mg/dL
Leukocytes, UA: NEGATIVE
Nitrite: NEGATIVE
Protein, ur: NEGATIVE mg/dL
Specific Gravity, Urine: 1.016 (ref 1.005–1.030)
pH: 8 (ref 5.0–8.0)

## 2018-02-27 LAB — BASIC METABOLIC PANEL
Anion gap: 9 (ref 5–15)
BUN: 11 mg/dL (ref 6–20)
CO2: 22 mmol/L (ref 22–32)
Calcium: 8.7 mg/dL — ABNORMAL LOW (ref 8.9–10.3)
Chloride: 109 mmol/L (ref 98–111)
Creatinine, Ser: 1.05 mg/dL (ref 0.61–1.24)
GFR calc Af Amer: >60 mL/min (ref >60)
GFR calc non Af Amer: >60 mL/min (ref >60)
Glucose, Bld: 99 mg/dL (ref 70–99)
Potassium: 4 mmol/L (ref 3.5–5.1)
Sodium: 140 mmol/L (ref 135–145)

## 2018-02-27 LAB — ABO/RH: ABO/RH(D): O NEG

## 2018-02-27 LAB — CBC
HCT: 46.2 % (ref 39.0–52.0)
Hemoglobin: 15.3 g/dL (ref 13.0–17.0)
MCH: 30.8 pg (ref 26.0–34.0)
MCHC: 33.1 g/dL (ref 30.0–36.0)
MCV: 93.1 fL (ref 78.0–100.0)
Platelets: 295 10*3/uL (ref 150–400)
RBC: 4.96 MIL/uL (ref 4.22–5.81)
RDW: 13.7 % (ref 11.5–15.5)
WBC: 6.9 10*3/uL (ref 4.0–10.5)

## 2018-02-27 LAB — MAGNESIUM: Magnesium: 2.2 mg/dL (ref 1.7–2.4)

## 2018-02-27 SURGERY — VIDEO ASSISTED THORACOSCOPY (VATS)/WEDGE RESECTION
Anesthesia: General | Site: Chest | Laterality: Right

## 2018-02-27 MED ORDER — FENTANYL CITRATE (PF) 250 MCG/5ML IJ SOLN
INTRAMUSCULAR | Status: AC
  Filled 2018-02-27: qty 5

## 2018-02-27 MED ORDER — FENTANYL 40 MCG/ML IV SOLN
INTRAVENOUS | Status: DC
  Administered 2018-02-27: 18:00:00 via INTRAVENOUS
  Administered 2018-02-27: 45 ug via INTRAVENOUS
  Administered 2018-02-28: 15 ug via INTRAVENOUS
  Administered 2018-02-28: 180 ug via INTRAVENOUS
  Administered 2018-02-28: 120 ug via INTRAVENOUS
  Administered 2018-02-28: 90 ug via INTRAVENOUS
  Administered 2018-02-28: 45 ug via INTRAVENOUS
  Administered 2018-02-28: 105 ug via INTRAVENOUS
  Administered 2018-03-01: 120 ug via INTRAVENOUS
  Administered 2018-03-01: 105 ug via INTRAVENOUS
  Administered 2018-03-01: 75 ug via INTRAVENOUS
  Administered 2018-03-01: 40 ug via INTRAVENOUS
  Administered 2018-03-01: 135 ug via INTRAVENOUS
  Administered 2018-03-01: 30 ug via INTRAVENOUS
  Administered 2018-03-02 (×2): 75 ug via INTRAVENOUS
  Administered 2018-03-02: 15 ug via INTRAVENOUS
  Administered 2018-03-02: 210 ug via INTRAVENOUS
  Administered 2018-03-02: 120 ug via INTRAVENOUS
  Administered 2018-03-02: 75 ug via INTRAVENOUS
  Administered 2018-03-03: 15 ug via INTRAVENOUS
  Administered 2018-03-03: 0 ug via INTRAVENOUS
  Filled 2018-02-27: qty 25
  Filled 2018-02-27 (×2): qty 1000

## 2018-02-27 MED ORDER — DEXMEDETOMIDINE HCL IN NACL 200 MCG/50ML IV SOLN
0.2000 ug/kg/h | INTRAVENOUS | Status: DC
  Filled 2018-02-27: qty 50

## 2018-02-27 MED ORDER — LABETALOL HCL 5 MG/ML IV SOLN
INTRAVENOUS | Status: AC
  Administered 2018-02-27: 5 mg via INTRAVENOUS
  Filled 2018-02-27: qty 4

## 2018-02-27 MED ORDER — KETOROLAC TROMETHAMINE 15 MG/ML IJ SOLN
15.0000 mg | Freq: Four times a day (QID) | INTRAMUSCULAR | Status: DC
  Administered 2018-02-27 – 2018-03-01 (×6): 15 mg via INTRAVENOUS
  Filled 2018-02-27 (×6): qty 1

## 2018-02-27 MED ORDER — ONDANSETRON HCL 4 MG/2ML IJ SOLN: 4.0000 mg | Freq: Four times a day (QID) | INTRAMUSCULAR | Status: DC | PRN

## 2018-02-27 MED ORDER — LIDOCAINE 2% (20 MG/ML) 5 ML SYRINGE
INTRAMUSCULAR | Status: DC | PRN
  Administered 2018-02-27: 80 mg via INTRAVENOUS

## 2018-02-27 MED ORDER — PHENYLEPHRINE HCL 10 MG/ML IJ SOLN
INTRAMUSCULAR | Status: AC
  Filled 2018-02-27: qty 1

## 2018-02-27 MED ORDER — ACETAMINOPHEN 160 MG/5ML PO SOLN: 1000.0000 mg | Freq: Four times a day (QID) | ORAL | Status: DC

## 2018-02-27 MED ORDER — PROPOFOL 10 MG/ML IV BOLUS
INTRAVENOUS | Status: AC
  Filled 2018-02-27: qty 20

## 2018-02-27 MED ORDER — POTASSIUM CHLORIDE 10 MEQ/50ML IV SOLN: 10.0000 meq | Freq: Every day | INTRAVENOUS | Status: DC | PRN

## 2018-02-27 MED ORDER — ROCURONIUM BROMIDE 50 MG/5ML IV SOSY
PREFILLED_SYRINGE | INTRAVENOUS | Status: AC
  Filled 2018-02-27: qty 5

## 2018-02-27 MED ORDER — ONDANSETRON HCL 4 MG/2ML IJ SOLN
INTRAMUSCULAR | Status: DC | PRN
  Administered 2018-02-27: 4 mg via INTRAVENOUS

## 2018-02-27 MED ORDER — ENOXAPARIN SODIUM 40 MG/0.4ML ~~LOC~~ SOLN
40.0000 mg | Freq: Every day | SUBCUTANEOUS | Status: DC
  Administered 2018-02-27 – 2018-03-03 (×5): 40 mg via SUBCUTANEOUS
  Filled 2018-02-27 (×5): qty 0.4

## 2018-02-27 MED ORDER — SODIUM CHLORIDE 0.9 % IV SOLN
INTRAVENOUS | Status: DC | PRN
  Administered 2018-02-27: 95 mL

## 2018-02-27 MED ORDER — BISACODYL 5 MG PO TBEC
10.0000 mg | DELAYED_RELEASE_TABLET | Freq: Every day | ORAL | Status: DC
  Administered 2018-02-28 – 2018-03-01 (×2): 10 mg via ORAL
  Filled 2018-02-27 (×2): qty 2

## 2018-02-27 MED ORDER — NALOXONE HCL 0.4 MG/ML IJ SOLN: 0.4000 mg | INTRAMUSCULAR | Status: DC | PRN

## 2018-02-27 MED ORDER — CEFAZOLIN SODIUM-DEXTROSE 2-4 GM/100ML-% IV SOLN
2.0000 g | Freq: Three times a day (TID) | INTRAVENOUS | Status: AC
  Administered 2018-02-27 – 2018-02-28 (×2): 2 g via INTRAVENOUS
  Filled 2018-02-27 (×2): qty 100

## 2018-02-27 MED ORDER — ONDANSETRON HCL 4 MG/2ML IJ SOLN
INTRAMUSCULAR | Status: AC
  Filled 2018-02-27: qty 2

## 2018-02-27 MED ORDER — FENTANYL CITRATE (PF) 100 MCG/2ML IJ SOLN: 25.0000 ug | INTRAMUSCULAR | Status: DC | PRN

## 2018-02-27 MED ORDER — DIPHENHYDRAMINE HCL 50 MG/ML IJ SOLN: 12.5000 mg | Freq: Four times a day (QID) | INTRAMUSCULAR | Status: DC | PRN

## 2018-02-27 MED ORDER — SODIUM CHLORIDE 0.9 % IJ SOLN
INTRAMUSCULAR | Status: DC | PRN
  Administered 2018-02-27: 50 mL via INTRAVENOUS

## 2018-02-27 MED ORDER — SUGAMMADEX SODIUM 200 MG/2ML IV SOLN
INTRAVENOUS | Status: DC | PRN
  Administered 2018-02-27: 200 mg via INTRAVENOUS

## 2018-02-27 MED ORDER — SENNOSIDES-DOCUSATE SODIUM 8.6-50 MG PO TABS
1.0000 | ORAL_TABLET | Freq: Every day | ORAL | Status: DC
  Filled 2018-02-27: qty 1

## 2018-02-27 MED ORDER — SODIUM CHLORIDE 0.9 % IV SOLN: INTRAVENOUS | Status: DC | PRN

## 2018-02-27 MED ORDER — LACTATED RINGERS IV SOLN
INTRAVENOUS | Status: DC
  Administered 2018-02-27 (×2): via INTRAVENOUS

## 2018-02-27 MED ORDER — SODIUM CHLORIDE 0.9 % IV SOLN
INTRAVENOUS | Status: DC | PRN
  Administered 2018-02-27: 15 ug/min via INTRAVENOUS

## 2018-02-27 MED ORDER — FENTANYL CITRATE (PF) 250 MCG/5ML IJ SOLN
INTRAMUSCULAR | Status: DC | PRN
  Administered 2018-02-27: 25 ug via INTRAVENOUS
  Administered 2018-02-27: 50 ug via INTRAVENOUS
  Administered 2018-02-27: 100 ug via INTRAVENOUS
  Administered 2018-02-27: 50 ug via INTRAVENOUS
  Administered 2018-02-27: 25 ug via INTRAVENOUS
  Administered 2018-02-27: 100 ug via INTRAVENOUS

## 2018-02-27 MED ORDER — MIDAZOLAM HCL 2 MG/2ML IJ SOLN
INTRAMUSCULAR | Status: AC
  Filled 2018-02-27: qty 2

## 2018-02-27 MED ORDER — TRAMADOL HCL 50 MG PO TABS: 50.0000 mg | ORAL_TABLET | Freq: Four times a day (QID) | ORAL | Status: DC | PRN

## 2018-02-27 MED ORDER — HYDROMORPHONE HCL 1 MG/ML IJ SOLN
0.2500 mg | INTRAMUSCULAR | Status: DC | PRN
  Administered 2018-02-27 (×4): 0.5 mg via INTRAVENOUS

## 2018-02-27 MED ORDER — ROCURONIUM BROMIDE 10 MG/ML (PF) SYRINGE
PREFILLED_SYRINGE | INTRAVENOUS | Status: DC | PRN
  Administered 2018-02-27: 10 mg via INTRAVENOUS
  Administered 2018-02-27: 80 mg via INTRAVENOUS
  Administered 2018-02-27: 20 mg via INTRAVENOUS

## 2018-02-27 MED ORDER — LIDOCAINE 2% (20 MG/ML) 5 ML SYRINGE
INTRAMUSCULAR | Status: AC
  Filled 2018-02-27: qty 5

## 2018-02-27 MED ORDER — OXYCODONE HCL 5 MG PO TABS
5.0000 mg | ORAL_TABLET | ORAL | Status: DC | PRN
  Administered 2018-03-02 – 2018-03-04 (×11): 10 mg via ORAL
  Filled 2018-02-27 (×11): qty 2

## 2018-02-27 MED ORDER — HYDROMORPHONE HCL 1 MG/ML IJ SOLN
INTRAMUSCULAR | Status: AC
  Administered 2018-02-27: 0.5 mg via INTRAVENOUS
  Filled 2018-02-27: qty 1

## 2018-02-27 MED ORDER — SODIUM CHLORIDE 0.9% FLUSH: 9.0000 mL | INTRAVENOUS | Status: DC | PRN

## 2018-02-27 MED ORDER — DIPHENHYDRAMINE HCL 12.5 MG/5ML PO ELIX
12.5000 mg | ORAL_SOLUTION | Freq: Four times a day (QID) | ORAL | Status: DC | PRN
  Filled 2018-02-27: qty 5

## 2018-02-27 MED ORDER — ALBUTEROL SULFATE (2.5 MG/3ML) 0.083% IN NEBU
2.5000 mg | INHALATION_SOLUTION | RESPIRATORY_TRACT | Status: AC
  Administered 2018-02-27: 2.5 mg via RESPIRATORY_TRACT

## 2018-02-27 MED ORDER — ROCURONIUM BROMIDE 50 MG/5ML IV SOSY
PREFILLED_SYRINGE | INTRAVENOUS | Status: AC
  Filled 2018-02-27: qty 25

## 2018-02-27 MED ORDER — SODIUM CHLORIDE 0.9 % IV SOLN
INTRAVENOUS | Status: DC
  Administered 2018-02-27: 17:00:00 via INTRAVENOUS
  Administered 2018-02-28: 100 mL/h via INTRAVENOUS
  Administered 2018-03-02: 14:00:00 via INTRAVENOUS

## 2018-02-27 MED ORDER — BUPIVACAINE HCL (PF) 0.5 % IJ SOLN
INTRAMUSCULAR | Status: DC | PRN
  Administered 2018-02-27: 30 mL

## 2018-02-27 MED ORDER — PROPOFOL 10 MG/ML IV BOLUS
INTRAVENOUS | Status: DC | PRN
  Administered 2018-02-27: 160 mg via INTRAVENOUS

## 2018-02-27 MED ORDER — DEXAMETHASONE SODIUM PHOSPHATE 10 MG/ML IJ SOLN
INTRAMUSCULAR | Status: DC | PRN
  Administered 2018-02-27: 10 mg via INTRAVENOUS

## 2018-02-27 MED ORDER — BUPIVACAINE LIPOSOME 1.3 % IJ SUSP
20.0000 mL | Freq: Once | INTRAMUSCULAR | Status: DC
  Filled 2018-02-27: qty 20

## 2018-02-27 MED ORDER — CHLORHEXIDINE GLUCONATE CLOTH 2 % EX PADS: 6.0000 | MEDICATED_PAD | Freq: Every day | CUTANEOUS | Status: DC

## 2018-02-27 MED ORDER — NICOTINE 21 MG/24HR TD PT24
21.0000 mg | MEDICATED_PATCH | Freq: Every day | TRANSDERMAL | Status: DC
  Administered 2018-02-27 – 2018-03-04 (×6): 21 mg via TRANSDERMAL
  Filled 2018-02-27 (×7): qty 1

## 2018-02-27 MED ORDER — ALBUTEROL SULFATE (2.5 MG/3ML) 0.083% IN NEBU
INHALATION_SOLUTION | RESPIRATORY_TRACT | Status: AC
  Administered 2018-02-27: 2.5 mg via RESPIRATORY_TRACT
  Filled 2018-02-27: qty 3

## 2018-02-27 MED ORDER — INSULIN ASPART 100 UNIT/ML ~~LOC~~ SOLN: 0.0000 [IU] | SUBCUTANEOUS | Status: DC

## 2018-02-27 MED ORDER — LABETALOL HCL 5 MG/ML IV SOLN
5.0000 mg | INTRAVENOUS | Status: DC | PRN
  Administered 2018-02-27 (×3): 5 mg via INTRAVENOUS

## 2018-02-27 MED ORDER — PHENYLEPHRINE 40 MCG/ML (10ML) SYRINGE FOR IV PUSH (FOR BLOOD PRESSURE SUPPORT)
PREFILLED_SYRINGE | INTRAVENOUS | Status: AC
  Filled 2018-02-27: qty 10

## 2018-02-27 MED ORDER — INSULIN ASPART 100 UNIT/ML ~~LOC~~ SOLN
0.0000 [IU] | Freq: Three times a day (TID) | SUBCUTANEOUS | Status: DC
  Administered 2018-02-28: 2 [IU] via SUBCUTANEOUS

## 2018-02-27 MED ORDER — ALBUTEROL SULFATE (2.5 MG/3ML) 0.083% IN NEBU
2.5000 mg | INHALATION_SOLUTION | Freq: Four times a day (QID) | RESPIRATORY_TRACT | Status: DC
  Administered 2018-02-27 – 2018-02-28 (×2): 2.5 mg via RESPIRATORY_TRACT
  Filled 2018-02-27 (×2): qty 3

## 2018-02-27 MED ORDER — ALBUMIN HUMAN 5 % IV SOLN
INTRAVENOUS | Status: DC | PRN
  Administered 2018-02-27: 12:00:00 via INTRAVENOUS

## 2018-02-27 MED ORDER — ACETAMINOPHEN 500 MG PO TABS: 1000.0000 mg | ORAL_TABLET | Freq: Four times a day (QID) | ORAL | Status: DC

## 2018-02-27 MED ORDER — LEVALBUTEROL HCL 0.63 MG/3ML IN NEBU
0.6300 mg | INHALATION_SOLUTION | Freq: Four times a day (QID) | RESPIRATORY_TRACT | Status: DC | PRN
  Administered 2018-03-01: 0.63 mg via RESPIRATORY_TRACT
  Filled 2018-02-27: qty 3

## 2018-02-27 MED ORDER — MUPIROCIN 2 % EX OINT
1.0000 "application " | TOPICAL_OINTMENT | Freq: Two times a day (BID) | CUTANEOUS | Status: DC
  Administered 2018-02-27: 1 via NASAL
  Filled 2018-02-27: qty 22

## 2018-02-27 MED ORDER — PROMETHAZINE HCL 25 MG/ML IJ SOLN: 6.2500 mg | INTRAMUSCULAR | Status: DC | PRN

## 2018-02-27 MED ORDER — 0.9 % SODIUM CHLORIDE (POUR BTL) OPTIME
TOPICAL | Status: DC | PRN
  Administered 2018-02-27: 2000 mL

## 2018-02-27 MED ORDER — BUPIVACAINE HCL (PF) 0.5 % IJ SOLN
INTRAMUSCULAR | Status: AC
  Filled 2018-02-27: qty 30

## 2018-02-27 MED ORDER — MIDAZOLAM HCL 2 MG/2ML IJ SOLN
INTRAMUSCULAR | Status: DC | PRN
  Administered 2018-02-27: 2 mg via INTRAVENOUS

## 2018-02-27 MED ORDER — DEXAMETHASONE SODIUM PHOSPHATE 10 MG/ML IJ SOLN
INTRAMUSCULAR | Status: AC
  Filled 2018-02-27: qty 1

## 2018-02-27 SURGICAL SUPPLY — 94 items
ADH SKN CLS APL DERMABOND .7 (GAUZE/BANDAGES/DRESSINGS) ×1
BAG SPEC RTRVL LRG 6X4 10 (ENDOMECHANICALS)
CANISTER SUCT 3000ML PPV (MISCELLANEOUS) ×3 IMPLANT
CATH THORACIC 28FR (CATHETERS) ×1 IMPLANT
CATH THORACIC 36FR (CATHETERS) IMPLANT
CATH THORACIC 36FR RT ANG (CATHETERS) IMPLANT
CLIP VESOCCLUDE MED 6/CT (CLIP) ×1 IMPLANT
CONN ST 1/4X3/8  BEN (MISCELLANEOUS)
CONN ST 1/4X3/8 BEN (MISCELLANEOUS) IMPLANT
CONN Y 3/8X3/8X3/8  BEN (MISCELLANEOUS)
CONN Y 3/8X3/8X3/8 BEN (MISCELLANEOUS) IMPLANT
CONT SPEC 4OZ CLIKSEAL STRL BL (MISCELLANEOUS) ×8 IMPLANT
COVER SURGICAL LIGHT HANDLE (MISCELLANEOUS) ×2 IMPLANT
DERMABOND ADVANCED (GAUZE/BANDAGES/DRESSINGS) ×1
DERMABOND ADVANCED .7 DNX12 (GAUZE/BANDAGES/DRESSINGS) ×1 IMPLANT
DRAIN CHANNEL 28F RND 3/8 FF (WOUND CARE) IMPLANT
DRAIN CHANNEL 32F RND 10.7 FF (WOUND CARE) IMPLANT
DRAPE LAPAROSCOPIC ABDOMINAL (DRAPES) ×2 IMPLANT
DRAPE WARM FLUID 44X44 (DRAPE) ×2 IMPLANT
ELECT BLADE 6.5 EXT (BLADE) ×3 IMPLANT
ELECT REM PT RETURN 9FT ADLT (ELECTROSURGICAL) ×2
ELECTRODE REM PT RTRN 9FT ADLT (ELECTROSURGICAL) ×1 IMPLANT
GAUZE SPONGE 4X4 12PLY STRL (GAUZE/BANDAGES/DRESSINGS) ×1 IMPLANT
GLOVE BIOGEL M 7.0 STRL (GLOVE) ×2 IMPLANT
GLOVE BIOGEL PI IND STRL 6.5 (GLOVE) IMPLANT
GLOVE BIOGEL PI IND STRL 7.0 (GLOVE) IMPLANT
GLOVE BIOGEL PI INDICATOR 6.5 (GLOVE) ×2
GLOVE BIOGEL PI INDICATOR 7.0 (GLOVE) ×2
GLOVE SURG SIGNA 7.5 PF LTX (GLOVE) ×4 IMPLANT
GLOVE SURG SS PI 6.0 STRL IVOR (GLOVE) ×1 IMPLANT
GOWN STRL REUS W/ TWL LRG LVL3 (GOWN DISPOSABLE) ×2 IMPLANT
GOWN STRL REUS W/ TWL XL LVL3 (GOWN DISPOSABLE) ×2 IMPLANT
GOWN STRL REUS W/TWL LRG LVL3 (GOWN DISPOSABLE) ×4
GOWN STRL REUS W/TWL XL LVL3 (GOWN DISPOSABLE) ×2
HANDLE UNIV ENDO GIA (ENDOMECHANICALS) ×1 IMPLANT
HEMOSTAT SURGICEL 2X14 (HEMOSTASIS) IMPLANT
KIT BASIN OR (CUSTOM PROCEDURE TRAY) ×2 IMPLANT
KIT SUCTION CATH 14FR (SUCTIONS) ×2 IMPLANT
KIT TURNOVER KIT B (KITS) ×2 IMPLANT
NDL HYPO 25GX1X1/2 BEV (NEEDLE) ×1 IMPLANT
NDL SPNL 18GX3.5 QUINCKE PK (NEEDLE) IMPLANT
NDL SPNL 22GX3.5 QUINCKE BK (NEEDLE) ×1 IMPLANT
NEEDLE HYPO 25GX1X1/2 BEV (NEEDLE) ×2 IMPLANT
NEEDLE SPNL 18GX3.5 QUINCKE PK (NEEDLE) IMPLANT
NEEDLE SPNL 22GX3.5 QUINCKE BK (NEEDLE) ×2 IMPLANT
NS IRRIG 1000ML POUR BTL (IV SOLUTION) ×6 IMPLANT
PACK CHEST (CUSTOM PROCEDURE TRAY) ×2 IMPLANT
PAD ARMBOARD 7.5X6 YLW CONV (MISCELLANEOUS) ×4 IMPLANT
POUCH ENDO CATCH II 15MM (MISCELLANEOUS) IMPLANT
POUCH SPECIMEN RETRIEVAL 10MM (ENDOMECHANICALS) IMPLANT
RELOAD EGIA 45 MED/THCK PURPLE (STAPLE) ×3 IMPLANT
RELOAD EGIA 60 MED/THCK PURPLE (STAPLE) ×2 IMPLANT
RELOAD STAPLE 60 MED/THCK ART (STAPLE) IMPLANT
RELOAD TRI 60 ART MED THCK BLK (STAPLE) ×1 IMPLANT
RELOAD TRI 60 ART MED THCK PUR (STAPLE) ×12 IMPLANT
SCISSORS ENDO CVD 5DCS (MISCELLANEOUS) IMPLANT
SEALANT PROGEL (MISCELLANEOUS) IMPLANT
SEALANT SURG COSEAL 4ML (VASCULAR PRODUCTS) IMPLANT
SEALANT SURG COSEAL 8ML (VASCULAR PRODUCTS) IMPLANT
SHEARS HARMONIC HDI 20CM (ELECTROSURGICAL) IMPLANT
SOLUTION ANTI FOG 6CC (MISCELLANEOUS) ×2 IMPLANT
SPECIMEN JAR MEDIUM (MISCELLANEOUS) ×2 IMPLANT
SPONGE INTESTINAL PEANUT (DISPOSABLE) ×4 IMPLANT
SPONGE TONSIL TAPE 1 RFD (DISPOSABLE) ×3 IMPLANT
SUT PROLENE 4 0 RB 1 (SUTURE)
SUT PROLENE 4-0 RB1 .5 CRCL 36 (SUTURE) IMPLANT
SUT SILK  1 MH (SUTURE) ×2
SUT SILK 1 MH (SUTURE) ×2 IMPLANT
SUT SILK 1 TIES 10X30 (SUTURE) ×2 IMPLANT
SUT SILK 2 0 SH (SUTURE) IMPLANT
SUT SILK 2 0SH CR/8 30 (SUTURE) IMPLANT
SUT SILK 3 0 SH 30 (SUTURE) IMPLANT
SUT SILK 3 0SH CR/8 30 (SUTURE) ×2 IMPLANT
SUT VIC AB 0 CTX 27 (SUTURE) IMPLANT
SUT VIC AB 1 CTX 27 (SUTURE) ×2 IMPLANT
SUT VIC AB 2-0 CT1 27 (SUTURE)
SUT VIC AB 2-0 CT1 TAPERPNT 27 (SUTURE) IMPLANT
SUT VIC AB 2-0 CTX 36 (SUTURE) ×3 IMPLANT
SUT VIC AB 3-0 MH 27 (SUTURE) IMPLANT
SUT VIC AB 3-0 SH 27 (SUTURE)
SUT VIC AB 3-0 SH 27X BRD (SUTURE) IMPLANT
SUT VIC AB 3-0 X1 27 (SUTURE) ×2 IMPLANT
SUT VICRYL 0 UR6 27IN ABS (SUTURE) IMPLANT
SUT VICRYL 2 TP 1 (SUTURE) IMPLANT
SYR 30ML LL (SYRINGE) ×2 IMPLANT
SYSTEM SAHARA CHEST DRAIN ATS (WOUND CARE) ×2 IMPLANT
TAPE CLOTH SURG 6X10 WHT LF (GAUZE/BANDAGES/DRESSINGS) ×1 IMPLANT
TIP APPLICATOR SPRAY EXTEND 16 (VASCULAR PRODUCTS) IMPLANT
TOWEL GREEN STERILE (TOWEL DISPOSABLE) ×2 IMPLANT
TOWEL GREEN STERILE FF (TOWEL DISPOSABLE) ×2 IMPLANT
TRAY FOLEY MTR SLVR 16FR STAT (SET/KITS/TRAYS/PACK) ×2 IMPLANT
TROCAR XCEL BLADELESS 5X75MML (TROCAR) ×2 IMPLANT
TROCAR XCEL NON-BLD 5MMX100MML (ENDOMECHANICALS) IMPLANT
WATER STERILE IRR 1000ML POUR (IV SOLUTION) ×3 IMPLANT

## 2018-02-27 NOTE — Anesthesia Postprocedure Evaluation (Signed)
Anesthesia Post Note  Patient: Benjamin Burke  Procedure(s) Performed: VIDEO ASSISTED THORACOSCOPY (VATS)/WEDGE RESECTION (Right Chest)     Patient location during evaluation: PACU Anesthesia Type: General Level of consciousness: sedated Pain management: pain level controlled Vital Signs Assessment: post-procedure vital signs reviewed and stable Respiratory status: spontaneous breathing and respiratory function stable Cardiovascular status: stable Postop Assessment: no apparent nausea or vomiting Anesthetic complications: no    Last Vitals:  Vitals:   02/27/18 1608 02/27/18 1610  BP: 140/84   Pulse: 93   Resp: 18 15  Temp:    SpO2: 91%     Last Pain:  Vitals:   02/27/18 1500  TempSrc:   PainSc: 6                  [name]

## 2018-02-27 NOTE — Brief Op Note (Signed)
02/27/2018  2:03 PM  PATIENT:  Benjamin Burke  57 y.o. male  PRE-OPERATIVE DIAGNOSIS:  Recurrent right spontaneous pneumothorax  POST-OPERATIVE DIAGNOSIS:  Recurrent right spontaneous pneumothorax  PROCEDURE:  RIGHT VIDEO ASSISTED THORACOSCOPY RESECTION OF BLEBS PLEURAL ABRASION  SURGEON:  Surgeon(s) and Role:    * Melrose Nakayama, MD - Primary  PHYSICIAN ASSISTANT:  Nicholes Rough, PA-C  ANESTHESIA:   general  EBL:  100 ml  BLOOD ADMINISTERED:none  DRAINS: ONE STRAIGHT CHEST TUBE   LOCAL MEDICATIONS USED:  BUPIVICAINE   SPECIMEN:  Source of Specimen:  UPPER LOBE AND LOWER LOBE BLEBS  DISPOSITION OF SPECIMEN:  PATHOLOGY  COUNTS:  YES  TOURNIQUET:  * No tourniquets in log *  DICTATION: .Dragon Dictation  PLAN OF CARE: Admit to inpatient   PATIENT DISPOSITION:  PACU - hemodynamically stable.   Delay start of Pharmacological VTE agent (>24hrs) due to surgical blood loss or risk of bleeding: no

## 2018-02-27 NOTE — Anesthesia Preprocedure Evaluation (Addendum)
Anesthesia Evaluation  Patient identified by MRN, date of birth, ID band Patient awake    Reviewed: Allergy & Precautions, NPO status , Patient's Chart, lab work & pertinent test results  History of Anesthesia Complications Negative for: history of anesthetic complications  Airway Mallampati: II  TM Distance: >3 FB Neck ROM: Full    Dental  (+) Poor Dentition, Dental Advisory Given   Pulmonary COPD, Current Smoker,    Pulmonary exam normal        Cardiovascular hypertension, Pt. on medications Normal cardiovascular exam     Neuro/Psych negative neurological ROS  negative psych ROS   GI/Hepatic negative GI ROS, (+)     substance abuse  cocaine use,   Endo/Other  negative endocrine ROS  Renal/GU negative Renal ROS     Musculoskeletal   Abdominal   Peds  Hematology   Anesthesia Other Findings   Reproductive/Obstetrics                            Anesthesia Physical Anesthesia Plan  ASA: III  Anesthesia Plan: General   Post-op Pain Management:    Induction: Intravenous  PONV Risk Score and Plan: 2 and Ondansetron and Dexamethasone  Airway Management Planned: Double Lumen EBT  Additional Equipment: Arterial line  Intra-op Plan:   Post-operative Plan: Possible Post-op intubation/ventilation  Informed Consent: I have reviewed the patients History and Physical, chart, labs and discussed the procedure including the risks, benefits and alternatives for the proposed anesthesia with the patient or authorized representative who has indicated his/her understanding and acceptance.   Dental advisory given  Plan Discussed with: CRNA, Anesthesiologist and Surgeon  Anesthesia Plan Comments:        Anesthesia Quick Evaluation

## 2018-02-27 NOTE — Progress Notes (Signed)
  [facility] TEAM 1 - Stepdown/ICU TEAM  Benjamin Burke  NOB:[id number] DOB: 1960-12-23 DOA: 02/25/2018 PCP: Vicenta Aly, FNP    Brief Narrative:  57 y.o. male w/ hx of COPD, polysubstance abuse, and a spontaneous R pneumothorax March 2018 who presented w/ 2 days of SOB.  [name] because he had his son's wedding to attend.  Patient states during previous episode he had significant chest pain which at this time is very minimal and only during deep breaths.  Mostly on the right side.   In the ED a CXR revealed a R pneumothorax at 15 to 20%.  TCTS was consulted.   Significant Events: 9/28 admit   Subjective: Care has been provided by TCTS today. [facility] continues to follow and will see the pt 9/30.  Assessment & Plan:  Recurrent spontaneous R pneumothorax Now s/p VATS w/ resection of blebs and pelural abrasion - post-op care per TCTS  Cocaine abuse  Severe COPD - ongoing tobacco abuse Did not require home O2 prior to this admit   Hypokalemia  Corrected w/ supplementation   HTN intolerant to ARB (makes me feel dizzy) - CCB started this admit   DVT prophylaxis: SCDs Code Status: FULL CODE Family Communication:  Disposition Plan:   Consultants:  TCTS  Antimicrobials:  none  Objective: Blood pressure (!) 183/107, pulse 85, temperature 97.9 F (36.6 C), temperature source Oral, resp. rate 19, height 5\' 6"  (1.676 m), weight 75.5 kg, SpO2 100 %.  Intake/Output Summary (Last 24 hours) at 02/27/2018 1457 Last data filed at 02/27/2018 1348 Gross per 24 hour  Intake 1050 ml  Output 150 ml  Net 900 ml   Filed Weights   02/25/18 2212 02/25/18 2220 02/26/18 1218  Weight: 77.1 kg 74.8 kg 75.5 kg    Examination: No exam today   CBC: Recent Labs  Lab 02/25/18 2225 02/26/18 0427 02/27/18 0350  WBC 10.4 7.7 6.9  HGB 15.5 14.8 15.3  HCT 47.4 44.4 46.2  MCV 92.2 92.3 93.1  PLT 323 287 947   Basic Metabolic Panel: Recent Labs  Lab  02/25/18 2225 02/26/18 0427 02/27/18 0350  NA 140 138 140  K 3.2* 2.9* 4.0  CL 103 103 109  CO2 26 26 22   GLUCOSE 99 109* 99  BUN 8 7 11   CREATININE 1.16 1.08 1.05  CALCIUM 9.4 8.6* 8.7*  MG  --   --  2.2   GFR: Estimated Creatinine Clearance: 71.7 mL/min (by C-G formula based on SCr of 1.05 mg/dL).  Liver Function Tests: Recent Labs  Lab 02/26/18 0427  AST 19  ALT 17  ALKPHOS 85  BILITOT 0.9  PROT 6.5  ALBUMIN 3.6    Cardiac Enzymes: Recent Labs  Lab 02/25/18 2225  TROPONINI <0.03    Scheduled Meds: . [MAR Hold] amLODipine  5 mg Oral Daily  . bupivacaine liposome  20 mL Infiltration Once  . [MAR Hold] Chlorhexidine Gluconate Cloth  6 each Topical Daily  . fentaNYL   Intravenous Q4H  . [MAR Hold] mupirocin ointment  1 application Nasal BID     LOS: 1 day    Cherene Altes, MD [facility] Office  [phone] Pager - Text Page per Amion as per below:  On-Call/Text Page:      [email or web address]  If 7PM-7AM, please contact night-coverage [email or web address] 02/27/2018, 2:57 PM

## 2018-02-27 NOTE — Anesthesia Procedure Notes (Signed)
Procedure Name: Intubation Date/Time: 02/27/2018 11:24 AM Performed by: Clearnce Sorrel, CRNA Pre-anesthesia Checklist: Patient identified, Emergency Drugs available, Suction available, Patient being monitored and Timeout performed Patient Re-evaluated:Patient Re-evaluated prior to induction Oxygen Delivery Method: Circle system utilized Preoxygenation: Pre-oxygenation with 100% oxygen Induction Type: IV induction Ventilation: Mask ventilation without difficulty and Oral airway inserted - appropriate to patient size Laryngoscope Size: Mac and 4 Grade View: Grade I Tube type: Oral Endobronchial tube: Left, Double lumen EBT and EBT position confirmed by fiberoptic bronchoscope and 39 Fr Number of attempts: 1 Airway Equipment and Method: Stylet Placement Confirmation: ETT inserted through vocal cords under direct vision,  positive ETCO2 and breath sounds checked- equal and bilateral Tube secured with: Tape Dental Injury: Teeth and Oropharynx as per pre-operative assessment

## 2018-02-27 NOTE — Progress Notes (Signed)
Patient returns from PACU to 2C07; art line leveled and zeroed; chest tube to -20cm sxn w/very large air leak noted - MD aware per report from PACU RN; await PCA pump for completion of PCA orders.  Family to bedside.

## 2018-02-27 NOTE — Progress Notes (Signed)
Patient informs this RN that he prefers his sister, Benjamin Burke, to be his HCPOA.  Paperwork was filled out by patient and sister prior to surgery however not notarized or signed and witnessed by two individuals.  Chaplaincy notified to speak to patient/family.    Concerns also expressed re: patient's "girlfriend", Hinton Dyer.  Questionable social hx; patient requests she not be left with the patient in-room overnight as she suggests; girlfriend will be encouraged to go home.

## 2018-02-27 NOTE — Anesthesia Procedure Notes (Signed)
Arterial Line Insertion Start/End9/29/2019 10:16 AM, 02/27/2018 10:26 AM Performed by: Duane Boston, MD, anesthesiologist  Patient location: Pre-op. Preanesthetic checklist: patient identified, IV checked, site marked, risks and benefits discussed, surgical consent, monitors and equipment checked, pre-op evaluation, timeout performed and anesthesia consent Lidocaine 1% used for infiltration Right, radial was placed Catheter size: 20 Fr Hand hygiene performed  and maximum sterile barriers used   Attempts: 2 Procedure performed without using ultrasound guided technique. Following insertion, dressing applied and Biopatch. Post procedure assessment: normal and unchanged  Patient tolerated the procedure well with no immediate complications.

## 2018-02-27 NOTE — Transfer of Care (Signed)
Immediate Anesthesia Transfer of Care Note  Patient: Benjamin Burke  Procedure(s) Performed: VIDEO ASSISTED THORACOSCOPY (VATS)/WEDGE RESECTION (Right Chest)  Patient Location: PACU  Anesthesia Type:General  Level of Consciousness: awake  Airway & Oxygen Therapy: Patient Spontanous Breathing and Patient connected to face mask oxygen  Post-op Assessment: Report given to RN and Post -op Vital signs reviewed and stable  Post vital signs: Reviewed and stable  Last Vitals:  Vitals Value Taken Time  BP    Temp    Pulse    Resp    SpO2      Last Pain:  Vitals:   02/27/18 0815  TempSrc:   PainSc: 0-No pain      Patients Stated Pain Goal: 2 (69/79/48 0165)  Complications: No apparent anesthesia complications

## 2018-02-27 NOTE — Progress Notes (Signed)
Attempt to call report.

## 2018-02-28 ENCOUNTER — Encounter (HOSPITAL_COMMUNITY): Payer: Self-pay | Admitting: Thoracic Surgery (Cardiothoracic Vascular Surgery)

## 2018-02-28 ENCOUNTER — Inpatient Hospital Stay (HOSPITAL_COMMUNITY): Payer: Medicare HMO

## 2018-02-28 LAB — BLOOD GAS, ARTERIAL
Acid-base deficit: 1.1 mmol/L (ref 0.0–2.0)
Bicarbonate: 23.4 mmol/L (ref 20.0–28.0)
O2 Content: 2 L/min
O2 Saturation: 98.1 %
Patient temperature: 98.6
pCO2 arterial: 41.3 mmHg (ref 32.0–48.0)
pH, Arterial: 7.371 (ref 7.350–7.450)
pO2, Arterial: 107 mmHg (ref 83.0–108.0)

## 2018-02-28 LAB — CBC
HCT: 42.5 % (ref 39.0–52.0)
Hemoglobin: 13.7 g/dL (ref 13.0–17.0)
MCH: 30.4 pg (ref 26.0–34.0)
MCHC: 32.2 g/dL (ref 30.0–36.0)
MCV: 94.2 fL (ref 78.0–100.0)
Platelets: 286 10*3/uL (ref 150–400)
RBC: 4.51 MIL/uL (ref 4.22–5.81)
RDW: 13.4 % (ref 11.5–15.5)
WBC: 16.3 10*3/uL — ABNORMAL HIGH (ref 4.0–10.5)

## 2018-02-28 LAB — BASIC METABOLIC PANEL
Anion gap: 7 (ref 5–15)
BUN: 10 mg/dL (ref 6–20)
CO2: 22 mmol/L (ref 22–32)
Calcium: 8 mg/dL — ABNORMAL LOW (ref 8.9–10.3)
Chloride: 106 mmol/L (ref 98–111)
Creatinine, Ser: 0.84 mg/dL (ref 0.61–1.24)
GFR calc Af Amer: >60 mL/min (ref >60)
GFR calc non Af Amer: >60 mL/min (ref >60)
Glucose, Bld: 147 mg/dL — ABNORMAL HIGH (ref 70–99)
Potassium: 4.1 mmol/L (ref 3.5–5.1)
Sodium: 135 mmol/L (ref 135–145)

## 2018-02-28 LAB — GLUCOSE, CAPILLARY
Glucose-Capillary: 135 mg/dL — ABNORMAL HIGH (ref 70–99)
Glucose-Capillary: 120 mg/dL — ABNORMAL HIGH (ref 70–99)
Glucose-Capillary: 100 mg/dL — ABNORMAL HIGH (ref 70–99)
Glucose-Capillary: 119 mg/dL — ABNORMAL HIGH (ref 70–99)

## 2018-02-28 MED ORDER — AMLODIPINE BESYLATE 5 MG PO TABS
5.0000 mg | ORAL_TABLET | Freq: Every day | ORAL | Status: DC
  Administered 2018-02-28 – 2018-03-04 (×5): 5 mg via ORAL
  Filled 2018-02-28 (×5): qty 1

## 2018-02-28 MED ORDER — ALBUTEROL SULFATE (2.5 MG/3ML) 0.083% IN NEBU
2.5000 mg | INHALATION_SOLUTION | Freq: Three times a day (TID) | RESPIRATORY_TRACT | Status: DC
  Administered 2018-02-28 – 2018-03-04 (×12): 2.5 mg via RESPIRATORY_TRACT
  Filled 2018-02-28 (×13): qty 3

## 2018-02-28 MED ORDER — IOPAMIDOL (ISOVUE-370) INJECTION 76%
INTRAVENOUS | Status: AC
  Filled 2018-02-28: qty 100

## 2018-02-28 MED ORDER — LOSARTAN POTASSIUM 25 MG PO TABS
25.0000 mg | ORAL_TABLET | Freq: Every day | ORAL | Status: DC
  Administered 2018-02-28: 25 mg via ORAL
  Filled 2018-02-28: qty 1

## 2018-02-28 NOTE — Progress Notes (Signed)
Benjamin Burke TEAM 1 - Stepdown/ICU TEAM  Benjamin Burke  JGO:[id number] DOB: 1961/01/27 DOA: 02/25/2018 PCP: Vicenta Aly, FNP    Brief Narrative:  57 y.o. male w/ hx of COPD, polysubstance abuse, and a spontaneous R pneumothorax March 2018 who presented w/ 2 days of SOB.  [name] because he had his son's wedding to attend.  Patient states during previous episode he had significant chest pain which at this time is very minimal and only during deep breaths.  Mostly on the right side.   In the ED a CXR revealed a R pneumothorax at 15 to 20%.  TCTS was consulted.   Significant Events: 9/28 admit   Subjective: Doing well post-op. Chest tube care per TCTS. No new complaints otherwise.   Assessment & Plan:  Recurrent spontaneous R pneumothorax Now s/p VATS w/ resection of blebs and pelural abrasion - post-op care per TCTS  Cocaine abuse Counsel on absolute need to abstain   Severe COPD - ongoing tobacco abuse Did not require home O2 prior to this admit - wean O2 as able   HTN intolerant to ARB (makes me feel dizzy) - CCB started this admit - BP stable for current clinical situation   DVT prophylaxis: SCDs Code Status: FULL CODE Family Communication: no family present at time of exam Disposition Plan: SDU   Consultants:  TCTS  Antimicrobials:  none  Objective: Blood pressure 123/78, pulse 88, temperature 98 F (36.7 C), temperature source Oral, resp. rate 20, height 5\' 6"  (1.676 m), weight 81 kg, SpO2 97 %.  Intake/Output Summary (Last 24 hours) at 02/28/2018 1412 Last data filed at 02/28/2018 1100 Gross per 24 hour  Intake 1797.06 ml  Output 2120 ml  Net -322.94 ml   Filed Weights   02/25/18 2220 02/26/18 1218 02/27/18 1628  Weight: 74.8 kg 75.5 kg 81 kg    Examination: General: No acute respiratory distress Lungs: mild crackles R base - no wheezing  Cardiovascular: Regular rate and rhythm without murmur gallop or rub normal S1 and  S2 Abdomen: Nontender, nondistended, soft, bowel sounds positive, no rebound, no ascites, no appreciable mass Extremities: No significant cyanosis, clubbing, or edema bilateral lower extremities   CBC: Recent Labs  Lab 02/25/18 2225 02/26/18 0427 02/27/18 0350 02/28/18 0559  WBC 10.4 7.7 6.9 16.3*  HGB 15.5 14.8 15.3 13.7  HCT 47.4 44.4 46.2 42.5  MCV 92.2 92.3 93.1 94.2  PLT 323 287 295 559   Basic Metabolic Panel: Recent Labs  Lab 02/25/18 2225 02/26/18 0427 02/27/18 0350 02/28/18 0559  NA 140 138 140 135  K 3.2* 2.9* 4.0 4.1  CL 103 103 109 106  CO2 26 26 22 22   GLUCOSE 99 109* 99 147*  BUN 8 7 11 10   CREATININE 1.16 1.08 1.05 0.84  CALCIUM 9.4 8.6* 8.7* 8.0*  MG  --   --  2.2  --    GFR: Estimated Creatinine Clearance: 99.4 mL/min (by C-G formula based on SCr of 0.84 mg/dL).  Liver Function Tests: Recent Labs  Lab 02/26/18 0427  AST 19  ALT 17  ALKPHOS 85  BILITOT 0.9  PROT 6.5  ALBUMIN 3.6    Cardiac Enzymes: Recent Labs  Lab 02/25/18 2225  TROPONINI <0.03    Scheduled Meds: . albuterol  2.5 mg Nebulization TID  . bisacodyl  10 mg Oral Daily  . bupivacaine liposome  20 mL Infiltration Once  . enoxaparin (LOVENOX) injection  40 mg Subcutaneous Daily  .  fentaNYL   Intravenous Q4H  . insulin aspart  0-15 Units Subcutaneous TID WC  . iopamidol      . ketorolac  15 mg Intravenous Q6H  . nicotine  21 mg Transdermal Daily  . senna-docusate  1 tablet Oral QHS     LOS: 2 days    Cherene Altes, MD [facility] Office  [phone] Pager - Text Page per Amion as per below:  On-Call/Text Page:      [email or web address]  If 7PM-7AM, please contact night-coverage [email or web address] 02/28/2018, 2:12 PM

## 2018-02-28 NOTE — Plan of Care (Signed)
  Problem: Education: Goal: Knowledge of General Education information will improve Description Including pain rating scale, medication(s)/side effects and non-pharmacologic comfort measures Outcome: Progressing   Problem: Health Behavior/Discharge Planning: Goal: Ability to manage health-related needs will improve Outcome: Progressing   Problem: Clinical Measurements: Goal: Ability to maintain clinical measurements within normal limits will improve Outcome: Progressing Goal: Will remain free from infection Outcome: Progressing Goal: Diagnostic test results will improve Outcome: Progressing Goal: Respiratory complications will improve Outcome: Progressing Goal: Cardiovascular complication will be avoided Outcome: Progressing   Problem: Activity: Goal: Risk for activity intolerance will decrease Outcome: Progressing   Problem: Nutrition: Goal: Adequate nutrition will be maintained Outcome: Progressing   Problem: Coping: Goal: Level of anxiety will decrease Outcome: Progressing   Problem: Elimination: Goal: Will not experience complications related to bowel motility Outcome: Progressing Goal: Will not experience complications related to urinary retention Outcome: Progressing   Problem: Pain Managment: Goal: General experience of comfort will improve Outcome: Progressing   Problem: Skin Integrity: Goal: Risk for impaired skin integrity will decrease Outcome: Progressing   Problem: Spiritual Needs Goal: Ability to function at adequate level Outcome: Progressing   

## 2018-02-28 NOTE — Op Note (Signed)
NAME: Benjamin Burke, Benjamin Burke MEDICAL RECORD [id number] ACCOUNT [id number] DATE OF BIRTH:05/04/1961 FACILITY: [facility] LOCATION: [facility] PHYSICIAN:Analilia Geddis Chaya Jan, MD  OPERATIVE REPORT  DATE OF PROCEDURE:  02/28/2018  PREOPERATIVE DIAGNOSIS:  Recurrent right spontaneous pneumothorax.  POSTOPERATIVE DIAGNOSIS:  Recurrent right spontaneous pneumothorax.  PROCEDURES:  Right video-assisted thoracoscopy, resection of blebs, pleural abrasion and intercostal nerve block.  SURGEON:  Modesto Charon, MD  ASSISTANT:  Nicholes Rough, PA.  ANESTHESIA:  General.  FINDINGS:  Severe emphysematous disease with multiple blebs.  Severe adhesions of the apex to the chest wall with ruptured bleb apically.  CLINICAL NOTE:  The patient is a 57 year old man with a history of tobacco abuse who presented with a 3-day history of chest pain and shortness of breath.  The symptoms were similar to a previous pneumothorax he had about a year prior.  He was found to  have a recurrent pneumothorax.  The options of a chest tube placement versus VATS for definitive treatment were discussed in detail with the patient.  He understood and accepted the risks and agreed to proceed with a right VATS for resection of blebs and pleural  abrasion.  The indications, risks, benefits, and alternatives were discussed in detail with the patient.  He understood and accepted the risks and agreed to proceed.  DESCRIPTION OF PROCEDURE:  The patient was brought to the operating room on 02/27/2018.  He had induction of general anesthesia and was intubated with a double lumen endotracheal tube.  Intravenous antibiotics were administered.  Sequential compression  devices were placed on the calves for DVT prophylaxis.  A Foley catheter was placed.  He was placed in a left lateral decubitus position and the right chest was prepped and draped in the usual sterile fashion.  Single lung ventilation of the left lung  was initiated as quickly as  possible.  It was tolerated well throughout the procedure.  A timeout was performed.  A solution of 20 mL of liposomal bupivacaine, 30 mL of 0.5% bupivacaine and 50 mL of saline was prepared.  It was used for local at the incision sites as well as for the intercostal nerve blocks.  The bupivacaine solution was  injected at the seventh interspace in the midaxillary line.  A port incision was made.  A 5 mm port was inserted into the chest.  The thoracoscope was advanced into the chest.  There was good isolation of the right lung.  There was an adhesion in the  area of the old chest tube site and there were significant adhesions at the apex.  A 4 cm working incision was made in the 4th interspace anterolaterally.  This area was injected with the bupivacaine solution prior to making the incision.  The  intercostal nerve blocks were performed from a posterior approach.  Five mL of the solution was injected into each interspace from the 3rd through the 9th in a subpleural plane.  The adhesions then were taken down.  This was quite difficult in the area  of the apex as some areas were very thick.  There was extensive bleb disease in the upper lobe.  There was diffuse emphysematous changes throughout the entire lung, particularly in the middle lobe.  The lower lobe was relatively spared.  Multiple blebs  were resected from the upper lobe.  This was done with firings of a Covidien stapler using pericardial strips for reinforcement.  Several of the blebs were confluent and required multiple firings of the stapler.  The chest was copiously irrigated  with  saline.  Test inflation showed air leakage around the staple lines.  There were no significant blebs that had not been stapled.  A 28 French chest tube was placed through the port incision and secured with a #1 silk suture.  Dual-lung ventilation was resumed.   The working incision was closed in standard fashion.  Dermabond was applied.  The chest tube was placed to  suction.  The patient was placed back in the supine position.    He was extubated in the operating room and taken to the [facility] in good condition.  AN/NUANCE  D:02/28/2018 T:02/28/2018 JOB:[id number]

## 2018-02-28 NOTE — Progress Notes (Signed)
Dr. Roxan Hockey called about pink frothy drainage coming out of chest tube site. Patient has moderate air leak known to MD. Chest tube dressing is clean, dry and intact. When patient coughs all chambers in pleurovac bubble. Patient is using his I/S reaching his goal of 1000. Patient resting comfortably in bed and his fentanyl PCA is controlling his pain. Will continue to monitor closely.

## 2018-02-28 NOTE — Progress Notes (Addendum)
      [address]       [place] [address]             [phone]       1 Day Post-Op Procedure(s) (LRB): VIDEO ASSISTED THORACOSCOPY (VATS)/WEDGE RESECTION (Right)  Subjective: Patient without specific complaints this am  Objective: Vital signs in last 24 hours: Temp:  [97.2 F (36.2 C)-98.4 F (36.9 C)] 97.9 F (36.6 C) (09/30 0734) Pulse Rate:  [78-102] 82 (09/30 0734) Cardiac Rhythm: Normal sinus rhythm (09/30 0702) Resp:  [13-28] 19 (09/30 0734) BP: (124-201)/(73-114) 132/87 (09/30 0734) SpO2:  [88 %-100 %] 98 % (09/30 0734) Arterial Line BP: (146-253)/(78-143) 161/88 (09/30 0600) Weight:  [81 kg] 81 kg (09/29 1628)      Intake/Output from previous day: 09/29 0701 - 09/30 0700 In: 1850 [P.O.:200; I.V.:800; IV Piggyback:450] Out: 2210 [Urine:1650; Blood:50; Chest Tube:510]   Physical Exam:  Cardiovascular: RRR Pulmonary: Clear to auscultation on the left and coarse on the right Abdomen: Soft, non tender, bowel sounds present. Extremities: Trace bilateral lower extremity edema. Wounds: Clean and dry.  Chest Tube: to suction, air leak  Lab Results: CBC: Recent Labs    02/27/18 0350 02/28/18 0559  WBC 6.9 16.3*  HGB 15.3 13.7  HCT 46.2 42.5  PLT 295 286   BMET:  Recent Labs    02/27/18 0350 02/28/18 0559  NA 140 135  K 4.0 4.1  CL 109 106  CO2 22 22  GLUCOSE 99 147*  BUN 11 10  CREATININE 1.05 0.84  CALCIUM 8.7* 8.0*    PT/INR:  Recent Labs    02/26/18 2106  LABPROT 12.1  INR 0.90   ABG:  INR: Will add last result for INR, ABG once components are confirmed Will add last 4 CBG results once components are confirmed  Assessment/Plan:  1. CV - SR in the 80's. Restart low dose Losartan 2.  Pulmonary - Chest tube with 510 cc last 24 hours (160 cc last 12 hours). Chest tube is to suction and there is an air leak. CXR this am appears stable. Chest tube to remain to suction for now. Encourage incentive spirometer 3.  Remove foley and a line 4. Decrease IVF   Tranae Laramie M ZimmermanPA-C 02/28/2018,8:00 AM [phone]

## 2018-03-01 ENCOUNTER — Inpatient Hospital Stay (HOSPITAL_COMMUNITY): Payer: Medicare HMO

## 2018-03-01 LAB — CBC
HCT: 40.5 % (ref 39.0–52.0)
Hemoglobin: 13.2 g/dL (ref 13.0–17.0)
MCH: 30.7 pg (ref 26.0–34.0)
MCHC: 32.6 g/dL (ref 30.0–36.0)
MCV: 94.2 fL (ref 78.0–100.0)
Platelets: 281 10*3/uL (ref 150–400)
RBC: 4.3 MIL/uL (ref 4.22–5.81)
RDW: 13.9 % (ref 11.5–15.5)
WBC: 13.3 10*3/uL — ABNORMAL HIGH (ref 4.0–10.5)

## 2018-03-01 LAB — GLUCOSE, CAPILLARY
Glucose-Capillary: 110 mg/dL — ABNORMAL HIGH (ref 70–99)
Glucose-Capillary: 123 mg/dL — ABNORMAL HIGH (ref 70–99)
Glucose-Capillary: 95 mg/dL (ref 70–99)
Glucose-Capillary: 104 mg/dL — ABNORMAL HIGH (ref 70–99)

## 2018-03-01 LAB — COMPREHENSIVE METABOLIC PANEL
ALT: 13 U/L (ref 0–44)
AST: 15 U/L (ref 15–41)
Albumin: 2.9 g/dL — ABNORMAL LOW (ref 3.5–5.0)
Alkaline Phosphatase: 61 U/L (ref 38–126)
Anion gap: 6 (ref 5–15)
BUN: 13 mg/dL (ref 6–20)
CO2: 27 mmol/L (ref 22–32)
Calcium: 8.2 mg/dL — ABNORMAL LOW (ref 8.9–10.3)
Chloride: 103 mmol/L (ref 98–111)
Creatinine, Ser: 1.07 mg/dL (ref 0.61–1.24)
GFR calc Af Amer: >60 mL/min (ref >60)
GFR calc non Af Amer: >60 mL/min (ref >60)
Glucose, Bld: 109 mg/dL — ABNORMAL HIGH (ref 70–99)
Potassium: 3.7 mmol/L (ref 3.5–5.1)
Sodium: 136 mmol/L (ref 135–145)
Total Bilirubin: 0.6 mg/dL (ref 0.3–1.2)
Total Protein: 5.6 g/dL — ABNORMAL LOW (ref 6.5–8.1)

## 2018-03-01 MED ORDER — POTASSIUM CHLORIDE CRYS ER 20 MEQ PO TBCR
30.0000 meq | EXTENDED_RELEASE_TABLET | Freq: Once | ORAL | Status: AC
  Administered 2018-03-01: 30 meq via ORAL
  Filled 2018-03-01: qty 1

## 2018-03-01 MED ORDER — GUAIFENESIN ER 600 MG PO TB12
600.0000 mg | ORAL_TABLET | Freq: Two times a day (BID) | ORAL | Status: DC
  Administered 2018-03-01 – 2018-03-02 (×4): 600 mg via ORAL
  Filled 2018-03-01 (×4): qty 1

## 2018-03-01 MED ORDER — POLYETHYLENE GLYCOL 3350 17 G PO PACK
17.0000 g | PACK | Freq: Every day | ORAL | Status: DC | PRN
  Administered 2018-03-01: 17 g via ORAL
  Filled 2018-03-01: qty 1

## 2018-03-01 MED ORDER — KETOROLAC TROMETHAMINE 30 MG/ML IJ SOLN
15.0000 mg | Freq: Four times a day (QID) | INTRAMUSCULAR | Status: AC
  Administered 2018-03-01 (×3): 15 mg via INTRAVENOUS
  Filled 2018-03-01 (×3): qty 1

## 2018-03-01 NOTE — Progress Notes (Addendum)
      [address]       [place] [address]             [phone]       2 Days Post-Op Procedure(s) (LRB): VIDEO ASSISTED THORACOSCOPY (VATS)/WEDGE RESECTION (Right)  Subjective: Patient has pain at chest tube site.  Objective: Vital signs in last 24 hours: Temp:  [97.9 F (36.6 C)-98.7 F (37.1 C)] 98.2 F (36.8 C) (10/01 0329) Pulse Rate:  [82-112] 100 (10/01 0329) Cardiac Rhythm: Normal sinus rhythm (10/01 0700) Resp:  [15-36] 22 (10/01 0412) BP: (123-144)/(78-87) 134/79 (10/01 0329) SpO2:  [86 %-99 %] 95 % (10/01 0715)      Intake/Output from previous day: 09/30 0701 - 10/01 0700 In: 997.1 [I.V.:997.1] Out: 1575 [Urine:675; Chest Tube:900]   Physical Exam:  Cardiovascular: RRR Pulmonary: Coarse this am Abdomen: Soft, non tender, bowel sounds present. Extremities: No lower extremity edema. Wounds: Clean and dry.  Chest Tube: to suction, air leak +1-2  Lab Results: CBC: Recent Labs    02/28/18 0559 03/01/18 0218  WBC 16.3* 13.3*  HGB 13.7 13.2  HCT 42.5 40.5  PLT 286 281   BMET:  Recent Labs    02/28/18 0559 03/01/18 0218  NA 135 136  K 4.1 3.7  CL 106 103  CO2 22 27  GLUCOSE 147* 109*  BUN 10 13  CREATININE 0.84 1.07  CALCIUM 8.0* 8.2*    PT/INR:  Recent Labs    02/26/18 2106  LABPROT 12.1  INR 0.90   ABG:  INR: Will add last result for INR, ABG once components are confirmed Will add last 4 CBG results once components are confirmed  Assessment/Plan:  1. CV - SR in the 80's. On Amlodipine 5 mg daily. 2.  Pulmonary - Chest tube with 900 cc last 24 hours (250 cc last 12 hours). Chest tube is to suction and there is an air leak (+1-2). CXR appears to show worsening aeration. Chest tube to remain to suction for now. Mucinex for cough. Encourage incentive spirometer and flutter valve 3. Supplement potassium   Donielle M ZimmermanPA-C 03/01/2018,7:23 AM [phone]  Patient seen and examined, agree with  above CXR slightly worse but overall not bad Will place CT to water seal [employer] C. Roxan Hockey, MD [facility] [phone]

## 2018-03-01 NOTE — Progress Notes (Signed)
[facility] TEAM 1 - Stepdown/ICU TEAM  Benjamin Burke  EUM:[id number] DOB: [birth year] DOA: 02/25/2018 PCP: Vicenta Aly, FNP    Brief Narrative:  57 y.o. male w/ hx of COPD, polysubstance abuse, and a spontaneous R pneumothorax March 2018 who presented w/ 2 days of SOB.  [name] because he had his son's wedding to attend.  Patient states during previous episode he had significant chest pain which at this time is very minimal and only during deep breaths.  Mostly on the right side.   In the ED a CXR revealed a R pneumothorax at 15 to 20%.  TCTS was consulted.   Significant Events: 9/28 admit   Subjective: No new complaints today. Some expected pain at the chest tube insertion site.    Assessment & Plan:  Recurrent spontaneous R pneumothorax Now s/p VATS w/ resection of blebs and pelural abrasion - post-op care per TCTS  Cocaine abuse Counseled on absolute need to abstain   Severe COPD - ongoing tobacco abuse Did not require home O2 prior to this admit - wean O2 as able   HTN intolerant to ARB (makes me feel dizzy) - CCB started this admit - BP stable for current clinical situation - no change in tx today   DVT prophylaxis: SCDs Code Status: FULL CODE Family Communication: no family present at time of exam Disposition Plan: SDU   Consultants:  TCTS  Antimicrobials:  none  Objective: Blood pressure 104/80, pulse 92, temperature 98.3 F (36.8 C), temperature source Oral, resp. rate 18, height 5\' 6"  (1.676 m), weight 81 kg, SpO2 93 %.  Intake/Output Summary (Last 24 hours) at 03/01/2018 0958 Last data filed at 03/01/2018 0813 Gross per 24 hour  Intake 997.06 ml  Output 1700 ml  Net -702.94 ml   Filed Weights   02/25/18 2220 02/26/18 1218 02/27/18 1628  Weight: 74.8 kg 75.5 kg 81 kg    Examination: General: NAD Lungs: CTA - no wheezing  Cardiovascular: RRR Abdomen: NT/ND, soft, bs+ Extremities: No edema B LE   CBC: Recent Labs    Lab 02/25/18 2225 02/26/18 0427 02/27/18 0350 02/28/18 0559 03/01/18 0218  WBC 10.4 7.7 6.9 16.3* 13.3*  HGB 15.5 14.8 15.3 13.7 13.2  HCT 47.4 44.4 46.2 42.5 40.5  MCV 92.2 92.3 93.1 94.2 94.2  PLT 323 287 295 286 540   Basic Metabolic Panel: Recent Labs  Lab 02/25/18 2225 02/26/18 0427 02/27/18 0350 02/28/18 0559 03/01/18 0218  NA 140 138 140 135 136  K 3.2* 2.9* 4.0 4.1 3.7  CL 103 103 109 106 103  CO2 26 26 22 22 27   GLUCOSE 99 109* 99 147* 109*  BUN 8 7 11 10 13   CREATININE 1.16 1.08 1.05 0.84 1.07  CALCIUM 9.4 8.6* 8.7* 8.0* 8.2*  MG  --   --  2.2  --   --    GFR: Estimated Creatinine Clearance: 78 mL/min (by C-G formula based on SCr of 1.07 mg/dL).  Liver Function Tests: Recent Labs  Lab 02/26/18 0427 03/01/18 0218  AST 19 15  ALT 17 13  ALKPHOS 85 61  BILITOT 0.9 0.6  PROT 6.5 5.6*  ALBUMIN 3.6 2.9*    Cardiac Enzymes: Recent Labs  Lab 02/25/18 2225  TROPONINI <0.03    Scheduled Meds: . albuterol  2.5 mg Nebulization TID  . amLODipine  5 mg Oral Daily  . bisacodyl  10 mg Oral Daily  . bupivacaine liposome  20 mL  Infiltration Once  . enoxaparin (LOVENOX) injection  40 mg Subcutaneous Daily  . fentaNYL   Intravenous Q4H  . guaiFENesin  600 mg Oral BID  . ketorolac  15 mg Intravenous Q6H  . nicotine  21 mg Transdermal Daily  . senna-docusate  1 tablet Oral QHS     LOS: 3 days    Cherene Altes, MD [facility] Office  [phone] Pager - Text Page per Amion as per below:  On-Call/Text Page:      [email or web address]  If 7PM-7AM, please contact night-coverage [email or web address] 03/01/2018, 9:58 AM

## 2018-03-01 NOTE — Discharge Instructions (Signed)
Thoracoscopy, Care After °Refer to this sheet in the next few weeks. These instructions provide you with information about caring for yourself after your procedure. Your health care provider may also give you more specific instructions. Your treatment has been planned according to current medical practices, but problems sometimes occur. Call your health care provider if you have any problems or questions after your procedure. °What can I expect after the procedure? °After your procedure, it is common to feel sore for up to two weeks. °Follow these instructions at home: °· There are many different ways to close and cover an incision, including stitches (sutures), skin glue, and adhesive strips. Follow your health care provider's instructions about: °? Incision care. °? Bandage (dressing) changes and removal. °? Incision closure removal. °· Check your incision area every day for signs of infection. Watch for: °? Redness, swelling, or pain. °? Fluid, blood, or pus. °· Take medicines only as directed by your health care provider. °· Try to cough often. Coughing helps to protect against lung infection (pneumonia). It may hurt to cough. If this happens, hold a pillow against your chest when you cough. °· Take deep breaths. This also helps to protect against pneumonia. °· If you were given an incentive spirometer, use it as directed by your health care provider. °· Do not take baths, swim, or use a hot tub until your health care provider approves. You may take showers. °· Avoid lifting until your health care provider approves. °· Avoid driving until your health care provider approves. °· Do not travel by airplane after the chest tube is removed until your health care provider approves. °Contact a health care provider if: °· You have a fever. °· Pain medicines do not ease your pain. °· You have redness, swelling, or increasing pain in your incision area. °· You develop a cough that does not go away, or you are coughing up  mucus that is yellow or green. °Get help right away if: °· You have fluid, blood, or pus coming from your incision. °· There is a bad smell coming from your incision or dressing. °· You develop a rash. °· You have difficulty breathing. °· You cough up blood. °· You develop light-headedness or you feel faint. °· You develop chest pain. °· Your heartbeat feels irregular or very fast. °This information is not intended to replace advice given to you by your health care provider. Make sure you discuss any questions you have with your health care provider. °Document Released: 12/05/2004 Document Revised: 01/19/2016 Document Reviewed: 01/31/2014 °Elsevier Interactive Patient Education © 2018 [employer] Inc[employer] ° °

## 2018-03-02 ENCOUNTER — Inpatient Hospital Stay (HOSPITAL_COMMUNITY): Payer: Medicare HMO

## 2018-03-02 DIAGNOSIS — I10 Essential (primary) hypertension: Secondary | ICD-10-CM

## 2018-03-02 DIAGNOSIS — J9383 Other pneumothorax: Secondary | ICD-10-CM

## 2018-03-02 DIAGNOSIS — F1410 Cocaine abuse, uncomplicated: Secondary | ICD-10-CM

## 2018-03-02 DIAGNOSIS — J449 Chronic obstructive pulmonary disease, unspecified: Secondary | ICD-10-CM

## 2018-03-02 LAB — GLUCOSE, CAPILLARY
Glucose-Capillary: 105 mg/dL — ABNORMAL HIGH (ref 70–99)
Glucose-Capillary: 97 mg/dL (ref 70–99)
Glucose-Capillary: 95 mg/dL (ref 70–99)
Glucose-Capillary: 112 mg/dL — ABNORMAL HIGH (ref 70–99)

## 2018-03-02 MED ORDER — BUDESONIDE 0.25 MG/2ML IN SUSP
0.2500 mg | Freq: Two times a day (BID) | RESPIRATORY_TRACT | Status: DC
  Administered 2018-03-02 – 2018-03-04 (×5): 0.25 mg via RESPIRATORY_TRACT
  Filled 2018-03-02 (×5): qty 2

## 2018-03-02 NOTE — Care Management Note (Addendum)
Case Management Note  Patient Details  Name: Benjamin Burke MRN: [id number] Date of Birth: 09/04/1960  Subjective/Objective:      Pt is now s/p VATS              Action/Plan:   PTA independent from with family - pt states he has strong support system in the home.  Pt has PCP and denied barriers with paying for medications.  CM will continue to follow for discharge needs    Expected Discharge Date:  02/28/18               Expected Discharge Plan:  Home/Self Care  In-House Referral:     Discharge planning Services  CM Consult  Post Acute Care Choice:    Choice offered to:     DME Arranged:    DME Agency:     HH Arranged:    HH Agency:     Status of Service:  In process, will continue to follow  If discussed at Long Length of Stay Meetings, dates discussed:    Additional Comments: 03/02/18 Pt positive for substance abuse - CSW consulted during progression meeting Maryclare Labrador, RN 03/02/2018, 10:39 AM

## 2018-03-02 NOTE — Progress Notes (Addendum)
      [address]       [place] [address]             [phone]       3 Days Post-Op Procedure(s) (LRB): VIDEO ASSISTED THORACOSCOPY (VATS)/WEDGE RESECTION (Right)  Subjective: Patient has pain at chest tube site and loose stools.  Objective: Vital signs in last 24 hours: Temp:  [97.6 F (36.4 C)-98.7 F (37.1 C)] 98.7 F (37.1 C) (10/02 0505) Pulse Rate:  [92-102] 100 (10/02 0505) Cardiac Rhythm: Normal sinus rhythm (10/02 0505) Resp:  [13-25] 19 (10/02 0505) BP: (104-141)/(73-89) 141/89 (10/02 0505) SpO2:  [90 %-100 %] 95 % (10/02 0505)      Intake/Output from previous day: 10/01 0701 - 10/02 0700 In: 641 [P.O.:240; I.V.:401] Out: 525 [Urine:325; Chest Tube:200]   Physical Exam:  Cardiovascular: RRR Pulmonary: Coarse this am Abdomen: Soft, non tender, bowel sounds present. Extremities: No lower extremity edema. Wounds: Clean and dry.  Chest Tube: to water seal,+-++ air leak that worsens with cough  Lab Results: CBC: Recent Labs    02/28/18 0559 03/01/18 0218  WBC 16.3* 13.3*  HGB 13.7 13.2  HCT 42.5 40.5  PLT 286 281   BMET:  Recent Labs    02/28/18 0559 03/01/18 0218  NA 135 136  K 4.1 3.7  CL 106 103  CO2 22 27  GLUCOSE 147* 109*  BUN 10 13  CREATININE 0.84 1.07  CALCIUM 8.0* 8.2*    PT/INR:  No results for input(s): LABPROT, INR in the last 72 hours. ABG:  INR: Will add last result for INR, ABG once components are confirmed Will add last 4 CBG results once components are confirmed  Assessment/Plan:  1. CV - SR in the 80's. On Amlodipine 5 mg daily. 2.  Pulmonary - On 3 liters of oxygen via [place]. Chest tube with 200 cc last 24 hours (250 cc last 12 hours). Chest tube is to water seal and there is an air leak (+1-2 that worsens with cough). CXR appears stable. Chest tube to remain to water seal for now. Mucinex for cough. Encourage incentive spirometer and flutter valve 3. Stop stool softeners   Donielle M  ZimmermanPA-C 03/02/2018,7:17 AM [phone]  Patient seen and examined, agree with above Lung is up- keep CT on water seal  [place] C. Roxan Hockey, MD [facility] [phone]

## 2018-03-02 NOTE — Progress Notes (Signed)
[facility] PROGRESS NOTE  QUANTE PETTRY LAG:[id number] DOB: [birth year] DOA: 02/25/2018  PCP: Vicenta Aly, FNP  Brief History/Interval Summary: 57 y.o.malew/ hx of COPD, polysubstance abuse, and a spontaneous R pneumothorax March 2018 who presented w/ 2 days of SOB. [name] because he had his son's wedding to attend. In the ED a CXR revealed a R pneumothorax at 15 to 20%. TCTS was consulted.   Reason for Visit: Right-sided pneumothorax  Consultants: Cardiothoracic surgery  Procedures: VATS 9/30  Antibiotics: None  Subjective/Interval History: Patient continues to feel somewhat short of breath.  However better than before.  Has pain at the chest tube site.  Denies any nausea or vomiting.  ROS: Denies any headaches.  Objective:  Vital Signs  Vitals:   03/02/18 0000 03/02/18 0505 03/02/18 0846 03/02/18 0855  BP: 136/73 (!) 141/89  135/87  Pulse: 96 100  96  Resp: (!) 25 19  18   Temp: 98.3 F (36.8 C) 98.7 F (37.1 C)    TempSrc: Oral Oral    SpO2: 93% 95% 96% 96%  Weight:      Height:        Intake/Output Summary (Last 24 hours) at 03/02/2018 1054 Last data filed at 03/02/2018 0800 Gross per 24 hour  Intake 1001 ml  Output 400 ml  Net 601 ml   Filed Weights   02/25/18 2220 02/26/18 1218 02/27/18 1628  Weight: 74.8 kg 75.5 kg 81 kg    General appearance: alert, cooperative, appears stated age and no distress Resp: Coarse breath sounds bilaterally.  Wheezing heard bilaterally.  Mildly tachypneic at rest. Cardio: regular rate and rhythm, S1, S2 normal, no murmur, click, rub or gallop GI: soft, non-tender; bowel sounds normal; no masses,  no organomegaly Extremities: extremities normal, atraumatic, no cyanosis or edema Neurologic: No focal neurological deficits.  Alert and oriented x3  Lab Results:  Data Reviewed: I have personally reviewed following labs and imaging studies  CBC: Recent Labs  Lab 02/25/18 2225  02/26/18 0427 02/27/18 0350 02/28/18 0559 03/01/18 0218  WBC 10.4 7.7 6.9 16.3* 13.3*  HGB 15.5 14.8 15.3 13.7 13.2  HCT 47.4 44.4 46.2 42.5 40.5  MCV 92.2 92.3 93.1 94.2 94.2  PLT 323 287 295 286 122    Basic Metabolic Panel: Recent Labs  Lab 02/25/18 2225 02/26/18 0427 02/27/18 0350 02/28/18 0559 03/01/18 0218  NA 140 138 140 135 136  K 3.2* 2.9* 4.0 4.1 3.7  CL 103 103 109 106 103  CO2 26 26 22 22 27   GLUCOSE 99 109* 99 147* 109*  BUN 8 7 11 10 13   CREATININE 1.16 1.08 1.05 0.84 1.07  CALCIUM 9.4 8.6* 8.7* 8.0* 8.2*  MG  --   --  2.2  --   --     GFR: Estimated Creatinine Clearance: 78 mL/min (by C-G formula based on SCr of 1.07 mg/dL).  Liver Function Tests: Recent Labs  Lab 02/26/18 0427 03/01/18 0218  AST 19 15  ALT 17 13  ALKPHOS 85 61  BILITOT 0.9 0.6  PROT 6.5 5.6*  ALBUMIN 3.6 2.9*     Coagulation Profile: Recent Labs  Lab 02/26/18 2106  INR 0.90    Cardiac Enzymes: Recent Labs  Lab 02/25/18 2225  TROPONINI <0.03    CBG: Recent Labs  Lab 03/01/18 0811 03/01/18 1204 03/01/18 1730 03/01/18 2132 03/02/18 0751  GLUCAP 110* 123* 95 104* 105*     Recent Results (from the past 240  hour(s))  MRSA PCR Screening     Status: None   Collection Time: 02/26/18 12:06 PM  Result Value Ref Range Status   MRSA by PCR NEGATIVE NEGATIVE Final    Comment:        The GeneXpert MRSA Assay (FDA approved for NASAL specimens only), is one component of a comprehensive MRSA colonization surveillance program. It is not intended to diagnose MRSA infection nor to guide or monitor treatment for MRSA infections. Performed at [facility], [place] [address], [place], [place] [address]   Surgical pcr screen     Status: Abnormal   Collection Time: 02/26/18  7:38 PM  Result Value Ref Range Status   MRSA, PCR NEGATIVE NEGATIVE Final   Staphylococcus aureus POSITIVE (A) NEGATIVE Final    Comment: RESULT CALLED TO, READ BACK BY AND VERIFIED  WITH: RN Verner Chol [phone] @2313  THANEY Performed at [facility], [place] [address], [place], [place] [address]       Radiology Studies: Dg Chest Port 1 View  Result Date: 03/02/2018 CLINICAL DATA:  Pneumothorax EXAM: PORTABLE CHEST 1 VIEW COMPARISON:  03/01/2018 FINDINGS: Right-sided chest tube with tip at the apex. There is a small pneumothorax on the right seen at the apex and base. The left lung is clear. Emphysematous changes and right apical opacity. Normal heart size. IMPRESSION: 1. Stable right apical chest tube positioning. 2. Small right-sided pneumothorax, newly seen at the base when compared yesterday. Electronically Signed   By: Monte Fantasia M.D.   On: 03/02/2018 08:26   Dg Chest Port 1 View  Result Date: 03/01/2018 CLINICAL DATA:  Follow-up chest tube EXAM: PORTABLE CHEST 1 VIEW COMPARISON:  02/28/2018 FINDINGS: Cardiac shadow is within normal limits. Right chest tube is noted in satisfactory position. Postsurgical changes are noted. Small apical pneumothorax is seen on the right although likely predominately related to the size of the thoracostomy tube. Persistent right upper lobe airspace disease is noted and stable. No new focal infiltrate is seen. IMPRESSION: Small right apical pneumothorax likely related to the size of the underlying thoracostomy catheter. Stable right upper lobe airspace disease. Electronically Signed   By: Inez Catalina M.D.   On: 03/01/2018 08:12     Medications:  Scheduled: . albuterol  2.5 mg Nebulization TID  . amLODipine  5 mg Oral Daily  . bupivacaine liposome  20 mL Infiltration Once  . enoxaparin (LOVENOX) injection  40 mg Subcutaneous Daily  . fentaNYL   Intravenous Q4H  . guaiFENesin  600 mg Oral BID  . nicotine  21 mg Transdermal Daily  . senna-docusate  1 tablet Oral QHS   Continuous: . sodium chloride 10 mL/hr at 02/28/18 0812  . potassium chloride     MIW:[id number] **OR** diphenhydrAMINE, fentaNYL (SUBLIMAZE) injection,  levalbuterol, naloxone **AND** sodium chloride flush, ondansetron (ZOFRAN) IV, oxyCODONE, polyethylene glycol, potassium chloride, traMADol  Assessment/Plan:    Recurrent spontaneous right pneumothorax Patient seen by cardiothoracic surgery.  Underwent VATS with resection of blebs and pleural abration on 9/30.  Further management per cardiothoracic surgery.  Severe COPD Patient has ongoing tobacco abuse.  Not on home oxygen though.  Continue nebulizer treatments.  Not noted to be on any long-acting medications at home.  If his symptoms do not improve may need to consider systemic steroids.  For now we will start him on nebulized budesonide.  Essential hypertension History of intolerance to ARB.  Started on calcium channel blocker.  Continue to monitor blood pressures.  Cocaine abuse  Counseled   DVT Prophylaxis: Lovenox    Code Status: Full code Family Communication: Discussed with the patient Disposition Plan: Management as outlined above.    LOS: 4 days   [facility] Pager [phone] 03/02/2018, 10:54 AM  If 7PM-7AM, please contact night-coverage at [email or web address], password [facility]

## 2018-03-03 ENCOUNTER — Inpatient Hospital Stay (HOSPITAL_COMMUNITY): Payer: Medicare HMO

## 2018-03-03 DIAGNOSIS — J441 Chronic obstructive pulmonary disease with (acute) exacerbation: Principal | ICD-10-CM

## 2018-03-03 LAB — GLUCOSE, CAPILLARY
Glucose-Capillary: 121 mg/dL — ABNORMAL HIGH (ref 70–99)
Glucose-Capillary: 158 mg/dL — ABNORMAL HIGH (ref 70–99)
Glucose-Capillary: 194 mg/dL — ABNORMAL HIGH (ref 70–99)
Glucose-Capillary: 160 mg/dL — ABNORMAL HIGH (ref 70–99)

## 2018-03-03 LAB — BASIC METABOLIC PANEL
Anion gap: 5 (ref 5–15)
BUN: 13 mg/dL (ref 6–20)
CO2: 29 mmol/L (ref 22–32)
Calcium: 8.7 mg/dL — ABNORMAL LOW (ref 8.9–10.3)
Chloride: 101 mmol/L (ref 98–111)
Creatinine, Ser: 0.93 mg/dL (ref 0.61–1.24)
GFR calc Af Amer: >60 mL/min (ref >60)
GFR calc non Af Amer: >60 mL/min (ref >60)
Glucose, Bld: 135 mg/dL — ABNORMAL HIGH (ref 70–99)
Potassium: 4.1 mmol/L (ref 3.5–5.1)
Sodium: 135 mmol/L (ref 135–145)

## 2018-03-03 LAB — CBC
HCT: 43.2 % (ref 39.0–52.0)
Hemoglobin: 14.4 g/dL (ref 13.0–17.0)
MCH: 30.6 pg (ref 26.0–34.0)
MCHC: 33.3 g/dL (ref 30.0–36.0)
MCV: 91.7 fL (ref 78.0–100.0)
Platelets: 322 10*3/uL (ref 150–400)
RBC: 4.71 MIL/uL (ref 4.22–5.81)
RDW: 13 % (ref 11.5–15.5)
WBC: 9.3 10*3/uL (ref 4.0–10.5)

## 2018-03-03 MED ORDER — BENZONATATE 100 MG PO CAPS
100.0000 mg | ORAL_CAPSULE | Freq: Three times a day (TID) | ORAL | Status: DC
  Administered 2018-03-03 – 2018-03-04 (×4): 100 mg via ORAL
  Filled 2018-03-03 (×4): qty 1

## 2018-03-03 MED ORDER — PREDNISONE 50 MG PO TABS
60.0000 mg | ORAL_TABLET | Freq: Every day | ORAL | Status: DC
  Administered 2018-03-03 – 2018-03-04 (×2): 60 mg via ORAL
  Filled 2018-03-03 (×2): qty 1

## 2018-03-03 MED ORDER — KETOROLAC TROMETHAMINE 30 MG/ML IJ SOLN
15.0000 mg | Freq: Once | INTRAMUSCULAR | Status: AC
  Administered 2018-03-03: 15 mg via INTRAVENOUS
  Filled 2018-03-03: qty 1

## 2018-03-03 MED ORDER — GUAIFENESIN 100 MG/5ML PO SOLN: 5.0000 mL | ORAL | Status: DC | PRN

## 2018-03-03 NOTE — Progress Notes (Addendum)
[facility] PROGRESS NOTE  Benjamin Burke FYB:[id number] DOB: 11/08/1960 DOA: 02/25/2018  PCP: Vicenta Aly, FNP  Brief History/Interval Summary: 57 y.o.malew/ hx of COPD, polysubstance abuse, and a spontaneous R pneumothorax March 2018 who presented w/ 2 days of SOB. [name] because he had his son's wedding to attend. In the ED a CXR revealed a R pneumothorax at 15 to 20%. TCTS was consulted.   Reason for Visit: Right-sided pneumothorax  Consultants: Cardiothoracic surgery  Procedures: VATS 9/30  Antibiotics: None  Subjective/Interval History: Patient continues to have a cough which is dry for the most part.  Still having wheezing.  Feels slightly better compared to yesterday.  Denies any nausea vomiting.    ROS: Denies any headaches  Objective:  Vital Signs  Vitals:   03/03/18 0403 03/03/18 0405 03/03/18 0733 03/03/18 0744  BP: 123/87   124/87  Pulse:    91  Resp:  19 20 19   Temp: 98.5 F (36.9 C)   98.4 F (36.9 C)  TempSrc: Oral   Oral  SpO2:  90% 94% 92%  Weight:      Height:        Intake/Output Summary (Last 24 hours) at 03/03/2018 0816 Last data filed at 03/03/2018 0700 Gross per 24 hour  Intake 720 ml  Output 1000 ml  Net -280 ml   Filed Weights   02/25/18 2220 02/26/18 1218 02/27/18 1628  Weight: 74.8 kg 75.5 kg 81 kg    General appearance: Awake alert.  In no distress. Resp: Coarse breath sounds bilaterally.  Diminished air entry at the bases.  Wheezes heard bilaterally.  Mildly tachypneic at rest.  No use of accessory muscles. Cardio: S1-S2 is normal regular.  No S3-S4.  No rubs murmurs or bruit GI: Abdomen soft.  Nontender nondistended.  Bowel sounds are present normal.  No masses organomegaly. Extremities: No pedal edema Neurologic: Alert and oriented x3.  No focal neurological deficits.  Lab Results:  Data Reviewed: I have personally reviewed following labs and imaging studies  CBC: Recent Labs   Lab 02/26/18 0427 02/27/18 0350 02/28/18 0559 03/01/18 0218 03/03/18 0322  WBC 7.7 6.9 16.3* 13.3* 9.3  HGB 14.8 15.3 13.7 13.2 14.4  HCT 44.4 46.2 42.5 40.5 43.2  MCV 92.3 93.1 94.2 94.2 91.7  PLT 287 295 286 281 527    Basic Metabolic Panel: Recent Labs  Lab 02/26/18 0427 02/27/18 0350 02/28/18 0559 03/01/18 0218 03/03/18 0322  NA 138 140 135 136 135  K 2.9* 4.0 4.1 3.7 4.1  CL 103 109 106 103 101  CO2 26 22 22 27 29   GLUCOSE 109* 99 147* 109* 135*  BUN 7 11 10 13 13   CREATININE 1.08 1.05 0.84 1.07 0.93  CALCIUM 8.6* 8.7* 8.0* 8.2* 8.7*  MG  --  2.2  --   --   --     GFR: Estimated Creatinine Clearance: 89.7 mL/min (by C-G formula based on SCr of 0.93 mg/dL).  Liver Function Tests: Recent Labs  Lab 02/26/18 0427 03/01/18 0218  AST 19 15  ALT 17 13  ALKPHOS 85 61  BILITOT 0.9 0.6  PROT 6.5 5.6*  ALBUMIN 3.6 2.9*     Coagulation Profile: Recent Labs  Lab 02/26/18 2106  INR 0.90    Cardiac Enzymes: Recent Labs  Lab 02/25/18 2225  TROPONINI <0.03    CBG: Recent Labs  Lab 03/02/18 0751 03/02/18 1225 03/02/18 1629 03/02/18 2108 03/03/18 0742  GLUCAP 105*  97 95 112* 121*     Recent Results (from the past 240 hour(s))  MRSA PCR Screening     Status: None   Collection Time: 02/26/18 12:06 PM  Result Value Ref Range Status   MRSA by PCR NEGATIVE NEGATIVE Final    Comment:        The GeneXpert MRSA Assay (FDA approved for NASAL specimens only), is one component of a comprehensive MRSA colonization surveillance program. It is not intended to diagnose MRSA infection nor to guide or monitor treatment for MRSA infections. Performed at [facility], [place] [address], [place], [place] [address]   Surgical pcr screen     Status: Abnormal   Collection Time: 02/26/18  7:38 PM  Result Value Ref Range Status   MRSA, PCR NEGATIVE NEGATIVE Final   Staphylococcus aureus POSITIVE (A) NEGATIVE Final    Comment: RESULT CALLED TO, READ BACK  BY AND VERIFIED WITH: RN Verner Chol [phone] @2313  THANEY Performed at [facility], [address], [place], [place] [address]       Radiology Studies: Dg Chest Port 1 View  Result Date: 03/02/2018 CLINICAL DATA:  Pneumothorax EXAM: PORTABLE CHEST 1 VIEW COMPARISON:  03/01/2018 FINDINGS: Right-sided chest tube with tip at the apex. There is a small pneumothorax on the right seen at the apex and base. The left lung is clear. Emphysematous changes and right apical opacity. Normal heart size. IMPRESSION: 1. Stable right apical chest tube positioning. 2. Small right-sided pneumothorax, newly seen at the base when compared yesterday. Electronically Signed   By: Monte Fantasia M.D.   On: 03/02/2018 08:26     Medications:  Scheduled: . albuterol  2.5 mg Nebulization TID  . amLODipine  5 mg Oral Daily  . budesonide (PULMICORT) nebulizer solution  0.25 mg Nebulization BID  . bupivacaine liposome  20 mL Infiltration Once  . enoxaparin (LOVENOX) injection  40 mg Subcutaneous Daily  . fentaNYL   Intravenous Q4H  . guaiFENesin  600 mg Oral BID  . ketorolac  15 mg Intravenous Once  . nicotine  21 mg Transdermal Daily  . senna-docusate  1 tablet Oral QHS   Continuous: . sodium chloride 10 mL/hr at 03/02/18 1409  . potassium chloride     KKX:[id number] **OR** diphenhydrAMINE, fentaNYL (SUBLIMAZE) injection, levalbuterol, naloxone **AND** sodium chloride flush, ondansetron (ZOFRAN) IV, oxyCODONE, polyethylene glycol, potassium chloride, traMADol  Assessment/Plan:    Recurrent spontaneous right pneumothorax Patient seen by cardiothoracic surgery.  Underwent VATS with resection of blebs and pleural abration on 9/30.  Further management per cardiothoracic surgery.  Seems to be stable.  Acute COPD exacerbation Patient has ongoing tobacco abuse.  Not on home oxygen though.  Continues to have wheezing and cough.  We will put him on antitussive agents.  He is on nebulizer treatments.   Nebulized budesonide was initiated yesterday.  Since he continues to have symptoms we will place him on systemic steroids.  Add prednisone.   Essential hypertension History of intolerance to ARB.  Started on calcium channel blocker.  Pressure is very well controlled.  Continue to watch.  Cocaine abuse Counseled   DVT Prophylaxis: Lovenox    Code Status: Full code Family Communication: Discussed with the patient Disposition Plan: Management as outlined above.  It appears that the plan is to remove the chest tube today.  Mobilize as tolerated.    LOS: 5 days   [facility] Pager [phone] 03/03/2018, 8:16 AM  If 7PM-7AM, please  contact night-coverage at [email or web address], password [facility]

## 2018-03-03 NOTE — Care Management Important Message (Signed)
Important Message  Patient Details  Name: Benjamin Burke MRN: [id number] Date of Birth: 06-18-1960   Medicare Important Message Given:  Yes    Sebastiano Luecke P Najae Filsaime 03/03/2018, 1:20 PM

## 2018-03-03 NOTE — Progress Notes (Addendum)
      [address]       [place] [address]             [phone]       4 Days Post-Op Procedure(s) (LRB): VIDEO ASSISTED THORACOSCOPY (VATS)/WEDGE RESECTION (Right)  Subjective: Patient has pain at chest tube sit.  Objective: Vital signs in last 24 hours: Temp:  [98.1 F (36.7 C)-98.6 F (37 C)] 98.5 F (36.9 C) (10/03 0403) Pulse Rate:  [92-102] 97 (10/02 2346) Cardiac Rhythm: Normal sinus rhythm (10/03 0405) Resp:  [16-26] 19 (10/03 0405) BP: (123-146)/(72-95) 123/87 (10/03 0403) SpO2:  [90 %-98 %] 90 % (10/03 0405)      Intake/Output from previous day: 10/02 0701 - 10/03 0700 In: 1080 [P.O.:1080] Out: 850 [Urine:700; Chest Tube:150]   Physical Exam:  Cardiovascular: RRR Pulmonary: Coarse this am Abdomen: Soft, non tender, bowel sounds present. Extremities: No lower extremity edema. Wounds: Clean and dry.  Chest Tube: to water seal,no air leak  Lab Results: CBC: Recent Labs    03/01/18 0218 03/03/18 0322  WBC 13.3* 9.3  HGB 13.2 14.4  HCT 40.5 43.2  PLT 281 322   BMET:  Recent Labs    03/01/18 0218 03/03/18 0322  NA 136 135  K 3.7 4.1  CL 103 101  CO2 27 29  GLUCOSE 109* 135*  BUN 13 13  CREATININE 1.07 0.93  CALCIUM 8.2* 8.7*    PT/INR:  No results for input(s): LABPROT, INR in the last 72 hours. ABG:  INR: Will add last result for INR, ABG once components are confirmed Will add last 4 CBG results once components are confirmed  Assessment/Plan:  1. CV - SR in the 80's. On Amlodipine 5 mg daily. 2.  Pulmonary - On 2 liters of oxygen via [place]. Chest tube with 150 cc last 24 hours (250 cc last 12 hours). Chest tube is to water seal and there is NO air leak this am. Await this am's CXR. Hope to remove chest tube if CXR stable;give Toradol prior to removal. Mucinex for cough. Encourage incentive spirometer and flutter valve    Donielle M ZimmermanPA-C 03/03/2018,7:24 AM [phone] Patient seen and examined, agree  with above No air leak- dc chest tube  Remo Lipps C. Roxan Hockey, MD [facility] [phone]

## 2018-03-04 ENCOUNTER — Inpatient Hospital Stay (HOSPITAL_COMMUNITY): Payer: Medicare HMO

## 2018-03-04 LAB — GLUCOSE, CAPILLARY
Glucose-Capillary: 87 mg/dL (ref 70–99)
Glucose-Capillary: 147 mg/dL — ABNORMAL HIGH (ref 70–99)

## 2018-03-04 MED ORDER — OXYCODONE HCL 5 MG PO TABS: ORAL_TABLET | ORAL | 0 refills | Status: DC

## 2018-03-04 MED ORDER — DOXYCYCLINE HYCLATE 100 MG PO TABS
100.0000 mg | ORAL_TABLET | Freq: Two times a day (BID) | ORAL | Status: DC
  Administered 2018-03-04: 100 mg via ORAL
  Filled 2018-03-04: qty 1

## 2018-03-04 MED ORDER — BENZONATATE 100 MG PO CAPS: 100.0000 mg | ORAL_CAPSULE | Freq: Three times a day (TID) | ORAL | 0 refills | Status: DC

## 2018-03-04 MED ORDER — POLYETHYLENE GLYCOL 3350 17 G PO PACK: 17.0000 g | PACK | Freq: Every day | ORAL | 0 refills | Status: DC | PRN

## 2018-03-04 MED ORDER — AMLODIPINE BESYLATE 5 MG PO TABS: 5.0000 mg | ORAL_TABLET | Freq: Every day | ORAL | 0 refills | Status: DC

## 2018-03-04 MED ORDER — DOXYCYCLINE HYCLATE 100 MG PO TABS: 100.0000 mg | ORAL_TABLET | Freq: Two times a day (BID) | ORAL | 0 refills | Status: AC

## 2018-03-04 MED ORDER — SENNOSIDES-DOCUSATE SODIUM 8.6-50 MG PO TABS: 1.0000 | ORAL_TABLET | Freq: Every day | ORAL | 0 refills | Status: DC

## 2018-03-04 MED ORDER — IPRATROPIUM-ALBUTEROL 0.5-2.5 (3) MG/3ML IN SOLN: 3.0000 mL | Freq: Four times a day (QID) | RESPIRATORY_TRACT | 0 refills | Status: AC | PRN

## 2018-03-04 MED ORDER — PREDNISONE 20 MG PO TABS: ORAL_TABLET | ORAL | 0 refills | Status: DC

## 2018-03-04 NOTE — Discharge Summary (Signed)
[facility]  Physician Discharge Summary   Patient ID: Benjamin Burke MRN: [id number] DOB/AGE: 1960-12-09 57 y.o.  Admit date: 02/25/2018 Discharge date: 03/04/2018  PCP: Benjamin Aly, FNP  DISCHARGE DIAGNOSES:  Pneumothorax status post VATS Acute COPD exacerbation, improved Essential hypertension    RECOMMENDATIONS FOR OUTPATIENT FOLLOW UP: 1. Outpatient follow-up with cardiothoracic surgery 2. PCP to consider maintenance treatment for COPD.   DISCHARGE CONDITION: fair  Diet recommendation: As before  [facility] Weights   02/25/18 2220 02/26/18 1218 02/27/18 1628  Weight: 74.8 kg 75.5 kg 81 kg    INITIAL HISTORY: 57 y.o.malew/ hx of COPD, polysubstance abuse, and a spontaneous R pneumothorax March 2018 who presented w/ 2 days of SOB. [name] because he had his son's wedding to attend. In the ED a CXR revealed a R pneumothorax at 15 to 20%. TCTS was consulted.   Consultants: Cardiothoracic surgery  Procedures: VATS 9/30   HOSPITAL COURSE:   Recurrent spontaneous right pneumothorax Patient seen by cardiothoracic surgery.  Underwent VATS with resection of blebs and pleural abration on 9/30.  Serial x-rays were done.  Patient's chest tube was removed yesterday.  Chest x-ray this morning shows stability.  Cleared by cardiothoracic surgery for discharge.  Acute COPD exacerbation Patient has ongoing tobacco abuse.  Not on home oxygen though.  He should continue to have wheezing and cough.  X-ray did show some consolidation in the right upper lung.  However his WBC was normal and he was afebrile.  Patient was mainly given steroids and nebulizer treatments with improvement in his symptoms.  He will be given a course of doxycycline as well.  Nebulizer treatments have been prescribed.  He will need to follow-up with his primary care provider to discuss further management of COPD. He is not noted to be on inhaled steroids. He will benefit  from being on  maintenance treatment.    He will be given prescription for tapering doses of her oral prednisone.    Essential hypertension Patient has history of intolerance to ARB.  He was started on amlodipine.  Blood pressure is reasonably well controlled.  Will be given a prescription for amlodipine.  Outpatient follow-up with PCP.  Cocaine abuse Counseled  Overall stable.  Okay for discharge home today.  Home O2 assessment performed prior to discharge.  He was saturating greater than 90% on room air with ambulation.   PERTINENT LABS:  The results of significant diagnostics from this hospitalization (including imaging, microbiology, ancillary and laboratory) are listed below for reference.    Microbiology: Recent Results (from the past 240 hour(s))  MRSA PCR Screening     Status: None   Collection Time: 02/26/18 12:06 PM  Result Value Ref Range Status   MRSA by PCR NEGATIVE NEGATIVE Final    Comment:        The GeneXpert MRSA Assay (FDA approved for NASAL specimens only), is one component of a comprehensive MRSA colonization surveillance program. It is not intended to diagnose MRSA infection nor to guide or monitor treatment for MRSA infections. Performed at [facility], [place] [address], [place], [place] [address]   Surgical pcr screen     Status: Abnormal   Collection Time: 02/26/18  7:38 PM  Result Value Ref Range Status   MRSA, PCR NEGATIVE NEGATIVE Final   Staphylococcus aureus POSITIVE (A) NEGATIVE Final    Comment: RESULT CALLED TO, READ BACK BY AND VERIFIED WITH: RN Verner Chol [phone] @2313  [facility] Performed  at [facility], [place] [address], [place], [place] [address]      Labs: Basic Metabolic Panel: Recent Labs  Lab 02/26/18 0427 02/27/18 0350 02/28/18 0559 03/01/18 0218 03/03/18 0322  NA 138 140 135 136 135  K 2.9* 4.0 4.1 3.7 4.1  CL 103 109 106 103 101  CO2 26 22 22 27 29   GLUCOSE 109* 99 147* 109* 135*  BUN 7 11 10 13 13     CREATININE 1.08 1.05 0.84 1.07 0.93  CALCIUM 8.6* 8.7* 8.0* 8.2* 8.7*  MG  --  2.2  --   --   --    Liver Function Tests: Recent Labs  Lab 02/26/18 0427 03/01/18 0218  AST 19 15  ALT 17 13  ALKPHOS 85 61  BILITOT 0.9 0.6  PROT 6.5 5.6*  ALBUMIN 3.6 2.9*   CBC: Recent Labs  Lab 02/26/18 0427 02/27/18 0350 02/28/18 0559 03/01/18 0218 03/03/18 0322  WBC 7.7 6.9 16.3* 13.3* 9.3  HGB 14.8 15.3 13.7 13.2 14.4  HCT 44.4 46.2 42.5 40.5 43.2  MCV 92.3 93.1 94.2 94.2 91.7  PLT 287 295 286 281 322   Cardiac Enzymes: Recent Labs  Lab 02/25/18 2225  TROPONINI <0.03    CBG: Recent Labs  Lab 03/03/18 1257 03/03/18 1629 03/03/18 2120 03/04/18 0841 03/04/18 1250  GLUCAP 158* 194* 160* 87 147*     IMAGING STUDIES Dg Chest 2 View  Result Date: 03/04/2018 CLINICAL DATA:  Right pneumothorax. EXAM: CHEST - 2 VIEW COMPARISON:  03/03/2018 FINDINGS: Cardiomediastinal silhouette is normal. Mediastinal contours appear intact. There is unchanged small right apical pneumothorax with residual right apical pleural effusion versus pleural thickening. Diffuse airspace consolidation in the right upper lobe. Severe upper lobe predominant emphysema. Osseous structures are without acute abnormality. Soft tissues are grossly normal. IMPRESSION: Unchanged small right apical pneumothorax with a residual right apical pleural effusion versus pleural thickening. Airspace consolidation in right upper lobe. Severe upper lobe predominant emphysema. Electronically Signed   By: Fidela Salisbury M.D.   On: 03/04/2018 08:39   Dg Chest 2 View  Result Date: 02/26/2018 CLINICAL DATA:  Right-sided pneumothorax with chest pain EXAM: CHEST - 2 VIEW COMPARISON:  February 25, 2018 FINDINGS: The previously noted pneumothorax tracking along the right lateral thorax with apical and basilar components as well as lateral components appears essentially stable compared to 1 day prior. There is a small pleural effusion  on the right as well. There is no tension component. There is atelectatic change in the right base. There is no edema or consolidation. Heart size and pulmonary vascularity are normal. No adenopathy. No bone lesions. IMPRESSION: Right-sided pneumothorax, stable from 1 day prior. No tension component. Small right pleural effusion as well. No edema or consolidation. Stable cardiac silhouette. Critical Value/emergent results were called by telephone at the time of interpretation on 02/26/2018 at 7:15 am to Dr. Jola Schmidt , who verbally acknowledged these results. Electronically Signed   By: Lowella Grip III M.D.   On: 02/26/2018 07:19   Dg Chest 2 View  Result Date: 02/25/2018 CLINICAL DATA:  Cough and right-sided chest pain, nonproductive. EXAM: CHEST - 2 VIEW COMPARISON:  08/25/2016 FINDINGS: New right anterolateral pneumothorax without tension measuring up to 3.5 cm at its base or approximately 15-20%. No pulmonary contusion, consolidation or effusion. No overt pulmonary edema. Heart and mediastinal contours are within normal limits. Visible fracture or suspicious osseous lesions. Mild aortic atherosclerosis is noted without aneurysm. IMPRESSION: Roughly 15-20% pneumothorax along the  periphery of the right lung without tension. These results were called by telephone at the time of interpretation on 02/25/2018 at 11:21 pm to Dr. Varney Biles , who verbally acknowledged these results. Electronically Signed   By: Ashley Royalty M.D.   On: 02/25/2018 23:21   Dg Chest 1v Repeat Same Day  Result Date: 03/03/2018 CLINICAL DATA:  Chest tube removal EXAM: CHEST - 1 VIEW SAME DAY COMPARISON:  03/03/2018 FINDINGS: Interval removal of right chest tube. Right apical and lateral pneumothorax, 5-10%. Continued airspace disease throughout the right lung, most confluent in the right upper lobe. No confluent opacity on the left. Heart is normal size. No visible effusions. IMPRESSION: Interval removal of right chest tube  with small right apical and lateral pneumothorax. Electronically Signed   By: Rolm Baptise M.D.   On: 03/03/2018 10:59   Dg Chest Port 1 View  Result Date: 03/03/2018 CLINICAL DATA:  Follow-up chest tube placement EXAM: PORTABLE CHEST 1 VIEW COMPARISON:  03/02/2018 FINDINGS: Right-sided chest tube is again noted and stable. The left lung is clear. The right lung again demonstrates diffuse increased opacity within the upper lung. Postsurgical changes are noted as well. The previously seen pneumothorax on the right is not well appreciated on today's exam. No new focal abnormality is noted. IMPRESSION: Resolution of previously seen right basilar pneumothorax. Otherwise stable appearance of the chest Electronically Signed   By: Inez Catalina M.D.   On: 03/03/2018 08:38   Dg Chest Port 1 View  Result Date: 03/02/2018 CLINICAL DATA:  Pneumothorax EXAM: PORTABLE CHEST 1 VIEW COMPARISON:  03/01/2018 FINDINGS: Right-sided chest tube with tip at the apex. There is a small pneumothorax on the right seen at the apex and base. The left lung is clear. Emphysematous changes and right apical opacity. Normal heart size. IMPRESSION: 1. Stable right apical chest tube positioning. 2. Small right-sided pneumothorax, newly seen at the base when compared yesterday. Electronically Signed   By: Monte Fantasia M.D.   On: 03/02/2018 08:26   Dg Chest Port 1 View  Result Date: 03/01/2018 CLINICAL DATA:  Follow-up chest tube EXAM: PORTABLE CHEST 1 VIEW COMPARISON:  02/28/2018 FINDINGS: Cardiac shadow is within normal limits. Right chest tube is noted in satisfactory position. Postsurgical changes are noted. Small apical pneumothorax is seen on the right although likely predominately related to the size of the thoracostomy tube. Persistent right upper lobe airspace disease is noted and stable. No new focal infiltrate is seen. IMPRESSION: Small right apical pneumothorax likely related to the size of the underlying thoracostomy  catheter. Stable right upper lobe airspace disease. Electronically Signed   By: Inez Catalina M.D.   On: 03/01/2018 08:12   Dg Chest Port 1 View  Result Date: 02/28/2018 CLINICAL DATA:  Chest tube, lung bleb EXAM: PORTABLE CHEST 1 VIEW COMPARISON:  02/27/2018 FINDINGS: Right chest tube in place. No residual pneumothorax. Airspace disease throughout the right upper lobe, stable. Left lung clear. IMPRESSION: Right chest tube remains in stable position without visible residual right pneumothorax. Right upper lobe airspace disease, stable. Electronically Signed   By: Rolm Baptise M.D.   On: 02/28/2018 08:22   Dg Chest Port 1 View  Result Date: 02/27/2018 CLINICAL DATA:  Postop lung bleb EXAM: PORTABLE CHEST 1 VIEW COMPARISON:  02/26/2018, 02/25/2018, 08/25/2016, CT chest 08/11/2017 FINDINGS: Interval insertion of right-sided chest tube with tip positioned at the right apex. Interval postsurgical changes of the right upper lung with sutures present. Parenchymal opacity is now visible in this  region. There is decreased right lateral pneumothorax. Small residual right apical pneumothorax, demonstrating 16 mm pleural-parenchymal separation at the apex. Linear atelectasis or scar at the left base. Stable cardiomediastinal silhouette. Small amount of right lateral chest wall emphysema. IMPRESSION: 1. Interim placement of right-sided chest tube with tip positioned at the apex. Decreased right lateral pneumothorax with small residual right apical pneumothorax as above. No midline shift. 2. Increased opacity in the right upper lung with suture all changes consistent with interval surgery. Opacity may be due to edema/hemorrhage/postsurgical changes. Electronically Signed   By: Donavan Foil M.D.   On: 02/27/2018 20:21    DISCHARGE EXAMINATION: Vitals:   03/04/18 0805 03/04/18 0900 03/04/18 1000 03/04/18 1046  BP:    136/79  Pulse:  97 95   Resp:  (!) 23 (!) 23   Temp:      TempSrc:      SpO2: 99% 91% 93%     Weight:      Height:       General appearance: alert, cooperative, appears stated age and no distress Resp: Normal effort at rest.  Coarse breath sounds.  But improved air entry compared to before yesterday no wheezing heard today. Cardio: regular rate and rhythm, S1, S2 normal, no murmur, click, rub or gallop GI: soft, non-tender; bowel sounds normal; no masses,  no organomegaly Extremities: extremities normal, atraumatic, no cyanosis or edema  DISPOSITION: Home  Discharge Instructions    Call MD for:  difficulty breathing, headache or visual disturbances   Complete by:  As directed    Call MD for:  extreme fatigue   Complete by:  As directed    Call MD for:  persistant dizziness or light-headedness   Complete by:  As directed    Call MD for:  persistant nausea and vomiting   Complete by:  As directed    Call MD for:  severe uncontrolled pain   Complete by:  As directed    Call MD for:  temperature >100.4   Complete by:  As directed    Diet - low sodium heart healthy   Complete by:  As directed    Discharge instructions   Complete by:  As directed    Please be sure to take your medications as prescribed.  You were cared for by a hospitalist during your hospital stay. If you have any questions about your discharge medications or the care you received while you were in the hospital after you are discharged, you can call the unit and asked to speak with the hospitalist on call if the hospitalist that took care of you is not available. Once you are discharged, your primary care physician will handle any further medical issues. Please note that NO REFILLS for any discharge medications will be authorized once you are discharged, as it is imperative that you return to your primary care physician (or establish a relationship with a primary care physician if you do not have one) for your aftercare needs so that they can reassess your need for medications and monitor your lab values. If you do  not have a primary care physician, you can call [phone] for a physician referral.   Increase activity slowly   Complete by:  As directed         Allergies as of 03/04/2018   No Active Allergies     Medication List    STOP taking these medications   losartan 50 MG tablet Commonly known as:  COZAAR  TAKE these medications   amLODipine 5 MG tablet Commonly known as:  NORVASC Take 1 tablet (5 mg total) by mouth daily.   aspirin EC 81 MG tablet Take 162 mg by mouth daily as needed (pain).   benzonatate 100 MG capsule Commonly known as:  TESSALON Take 1 capsule (100 mg total) by mouth 3 (three) times daily.   doxycycline 100 MG tablet Commonly known as:  VIBRA-TABS Take 1 tablet (100 mg total) by mouth every 12 (twelve) hours for 5 days.   ipratropium-albuterol 0.5-2.5 (3) MG/3ML Soln Commonly known as:  DUONEB Take 3 mLs by nebulization every 6 (six) hours as needed (wheezing, shortness of breath).   nicotine 21 mg/24hr patch Commonly known as:  NICODERM CQ - dosed in mg/24 hours Place 1 patch (21 mg total) onto the skin daily. What changed:    when to take this  reasons to take this   oxyCODONE 5 MG immediate release tablet Commonly known as:  Oxy IR/ROXICODONE Take 5 mg by mouth every 4-6 hours PRN severe pain   polyethylene glycol packet Commonly known as:  MIRALAX / GLYCOLAX Take 17 g by mouth daily as needed (constipation).   predniSONE 20 MG tablet Commonly known as:  DELTASONE Take 3 tablets once daily for 3 days followed by 2 tablets once daily for 3 days followed by 1 tablet once daily for 3 days and then stop.   PROAIR HFA 108 (90 Base) MCG/ACT inhaler Generic drug:  albuterol Inhale 2 puffs into the lungs every 6 (six) hours as needed for wheezing or shortness of breath.   senna-docusate 8.6-50 MG tablet Commonly known as:  Senokot-S Take 1 tablet by mouth at bedtime.            Durable Medical Equipment  (From admission, onward)           Start     Ordered   03/04/18 1026  For home use only DME Nebulizer machine  Once    Question:  Patient needs a nebulizer to treat with the following condition  Answer:  COPD (chronic obstructive pulmonary disease) ([place])   03/04/18 1026           Follow-up Information    Melrose Nakayama, MD. Go on 03/29/2018.   Specialty:  Cardiothoracic Surgery Why:  PA/LAT CXR to be taken (at [facility] which is in the same building as Dr. Leonarda Salon office) on 03/29/2018 at 10:30 am;Appointment time is at 11:00 am Contact information: [place] [address] [phone]        Benjamin Burke, [place]. Schedule an appointment as soon as possible for a visit in 1 week(s).   Specialty:  Nurse Practitioner Contact information: [place] [address] [phone]           TOTAL DISCHARGE TIME: 81 mins  Bonnielee Haff  [facility] Pager [phone]  03/04/2018, 2:12 PM

## 2018-03-04 NOTE — Progress Notes (Signed)
   03/04/18 1503  Clinical Encounter Type  Visited With Patient and family together  Visit Type Initial  Referral From Nurse  Stress Factors  Patient Stress Factors None identified  Family Stress Factors None identified   Responded to phone call to visit PT needing AD information. PT was alert and family was at beside awaiting discharge. I gave further instruction on filling out the AD, offered spiritual care and words of encouragement.   Chaplain Fidel Levy

## 2018-03-04 NOTE — Care Management Note (Signed)
Case Management Note  Patient Details  Name: Benjamin Burke MRN: [id number] Date of Birth: 1960-07-10  Subjective/Objective:      Pt is now s/p VATS              Action/Plan:   PTA independent from with family - pt states he has strong support system in the home.  Pt has PCP and denied barriers with paying for medications.  CM will continue to follow for discharge needs    Expected Discharge Date:  03/04/18               Expected Discharge Plan:  Home/Self Care  In-House Referral:     Discharge planning Services  CM Consult  Post Acute Care Choice:  Durable Medical Equipment Choice offered to:  Patient  DME Arranged:  Nebulizer machine DME Agency:  [place]:    [facility] Agency:     Status of Service:  Completed, signed off  If discussed at [place] of Stay Meetings, dates discussed:    Additional Comments: 03/04/2018 DME choice given - pt chose [facility] - agency contacted and referral accepted  03/02/18 Pt positive for substance abuse - CSW consulted during progression meeting Maryclare Labrador, RN 03/04/2018, 3:24 PM

## 2018-03-04 NOTE — Progress Notes (Addendum)
      [address]       [place] [address]             [phone]       5 Days Post-Op Procedure(s) (LRB): VIDEO ASSISTED THORACOSCOPY (VATS)/WEDGE RESECTION (Right)  Subjective: Patient states it is easier to take a deep breath with chest tube out. He still has pain on right side, however.  Objective: Vital signs in last 24 hours: Temp:  [97.4 F (36.3 C)-98.4 F (36.9 C)] 97.4 F (36.3 C) (10/04 0354) Pulse Rate:  [82-114] 90 (10/04 0354) Cardiac Rhythm: Sinus tachycardia (10/03 2000) Resp:  [16-21] 21 (10/04 0354) BP: (120-160)/(79-99) 149/90 (10/04 0354) SpO2:  [92 %-98 %] 98 % (10/04 0354) FiO2 (%):  [94 %] 94 % (10/03 1936)      Intake/Output from previous day: 10/03 0701 - 10/04 0700 In: 720 [P.O.:720] Out: 1100 [Urine:1100]   Physical Exam:  Cardiovascular: RRR Pulmonary: More clear this am Abdomen: Soft, non tender, bowel sounds present. Extremities: No lower extremity edema. Wounds: Clean and dry.   Lab Results: CBC: Recent Labs    03/03/18 0322  WBC 9.3  HGB 14.4  HCT 43.2  PLT 322   BMET:  Recent Labs    03/03/18 0322  NA 135  K 4.1  CL 101  CO2 29  GLUCOSE 135*  BUN 13  CREATININE 0.93  CALCIUM 8.7*    PT/INR:  No results for input(s): LABPROT, INR in the last 72 hours. ABG:  INR: Will add last result for INR, ABG once components are confirmed Will add last 4 CBG results once components are confirmed  Assessment/Plan:  1. CV - SR in the 80's. On Amlodipine 5 mg daily. 2.  Pulmonary - On 2 liters of oxygen via [place]. Wean to room air. Chest tube removed yesterday. CXR appears stable (small right apical pneumothorax). On Prednisone and Mucinex for cough. Encourage incentive spirometer and flutter valve 3.With history of COPD, may need oxygen. As long as he requires 2 liters or less of oxygen, is surgically stable for discharge. A prescription for Oxy IR is on the chart and follow up appointment will be made this  am.   Sharalyn Ink [facility] 03/04/2018,7:29 AM [phone]   Chart reviewed, patient examined, agree with above. CXR stable. He can go home.

## 2018-03-04 NOTE — Clinical Social Work Note (Signed)
Clinical Social Work Assessment  Patient Details  Name: Benjamin Burke MRN: [id number] Date of Birth: [birth year]  Date of referral:  03/04/18               Reason for consult:  Substance Use/ETOH Abuse                Permission sought to share information with:    Permission granted to share information::  No  Name::        Agency::     Relationship::     Contact Information:     Housing/Transportation Living arrangements for the past 2 months:  Single Family Home Source of Information:  Patient, Medical Team Patient Interpreter Needed:  None Criminal Activity/Legal Involvement Pertinent to Current Situation/Hospitalization:  No - Comment as needed Significant Relationships:  Adult Children, Dependent Children, Parents, Other Family Members Lives with:    Do you feel safe going back to the place where you live?  Yes Need for family participation in patient care:  Yes (Comment)  Care giving concerns:  Substance abuse.   Social Worker assessment / plan:  CSW met with patient. No supports at bedside. CSW introduced role and inquired about interest in substance abuse treatment options. Patient is not interested, stating he does not have a problem with cocaine and that he cannot read or write so giving the packet would be of no use. He states he uses only intermittently such as at weddings. Patient stated his biggest problem is with cigarettes. Patient has a friend that gave him a self-help/meditation CD to help with his tobacco use. He said she also specializes in substance abuse treatment. He does not drink alcohol. Patient cares for his 74 year old son, his 59 year old mother, and 25 year old uncle. Patient stated he has a family member that is a heroin user and he wanted to give the treatment facility packet to his mother. No further concerns. CSW signing off as social work intervention is no longer needed.  Employment status:  Disabled (Comment on whether or not currently receiving  Disability) Insurance information:  Managed Medicare PT Recommendations:  Not assessed at this time Information / Referral to community resources:  Residential Substance Abuse Treatment Options, Outpatient Substance Abuse Treatment Options  Patient/Family's Response to care:  Patient not interested in resources. Patient's family supportive and involved in patient's care. Patient appreciated social work intervention.  Patient/Family's Understanding of and Emotional Response to Diagnosis, Current Treatment, and Prognosis:  Patient has a good understanding of the reason for admission and social work consult. Patient appears happy with hospital care.  Emotional Assessment Appearance:  Appears stated age Attitude/Demeanor/Rapport:  Engaged, Gracious Affect (typically observed):  Appropriate, Calm, Pleasant Orientation:  Oriented to Self, Oriented to Place, Oriented to  Time, Oriented to Situation Alcohol / Substance use:  Tobacco Use, Illicit Drugs Psych involvement (Current and /or in the community):  No (Comment)  Discharge Needs  Concerns to be addressed:  Care Coordination Readmission within the last 30 days:  No Current discharge risk:  None Barriers to Discharge:  Continued Medical Work up   Candie Chroman, LCSW 03/04/2018, 12:31 PM

## 2018-03-15 ENCOUNTER — Ambulatory Visit
Admission: RE | Admit: 2018-03-15 | Discharge: 2018-03-15 | Disposition: A | Discharge status: Home / Self Care | Payer: Medicare HMO | Source: Ambulatory Visit | Attending: Nurse Practitioner | Admitting: Nurse Practitioner

## 2018-03-15 ENCOUNTER — Other Ambulatory Visit: Payer: Self-pay | Admitting: Nurse Practitioner

## 2018-03-15 DIAGNOSIS — Z8709 Personal history of other diseases of the respiratory system: Secondary | ICD-10-CM

## 2018-03-15 DIAGNOSIS — J449 Chronic obstructive pulmonary disease, unspecified: Secondary | ICD-10-CM

## 2018-03-25 ENCOUNTER — Other Ambulatory Visit: Payer: Self-pay | Admitting: Thoracic Surgery (Cardiothoracic Vascular Surgery)

## 2018-03-25 DIAGNOSIS — J439 Emphysema, unspecified: Secondary | ICD-10-CM

## 2018-03-29 ENCOUNTER — Other Ambulatory Visit: Payer: Self-pay

## 2018-03-29 ENCOUNTER — Ambulatory Visit (INDEPENDENT_AMBULATORY_CARE_PROVIDER_SITE_OTHER): Payer: Self-pay | Admitting: Thoracic Surgery (Cardiothoracic Vascular Surgery)

## 2018-03-29 ENCOUNTER — Ambulatory Visit
Admission: RE | Admit: 2018-03-29 | Discharge: 2018-03-29 | Disposition: A | Discharge status: Home / Self Care | Payer: Medicare HMO | Source: Ambulatory Visit | Attending: Thoracic Surgery (Cardiothoracic Vascular Surgery) | Admitting: Thoracic Surgery (Cardiothoracic Vascular Surgery)

## 2018-03-29 ENCOUNTER — Encounter: Payer: Self-pay | Admitting: Thoracic Surgery (Cardiothoracic Vascular Surgery)

## 2018-03-29 VITALS — BP 150/98 | HR 85 | Resp 18 | Ht 67.0 in | Wt 176.8 lb

## 2018-03-29 DIAGNOSIS — J439 Emphysema, unspecified: Secondary | ICD-10-CM

## 2018-03-29 DIAGNOSIS — J9383 Other pneumothorax: Secondary | ICD-10-CM

## 2018-03-29 MED ORDER — GABAPENTIN 400 MG PO CAPS: 400.0000 mg | ORAL_CAPSULE | Freq: Three times a day (TID) | ORAL | 3 refills | Status: DC

## 2018-03-29 MED ORDER — OXYCODONE HCL 5 MG PO TABS: 5.0000 mg | ORAL_TABLET | Freq: Four times a day (QID) | ORAL | 0 refills | Status: AC | PRN

## 2018-03-29 NOTE — Progress Notes (Signed)
[address]       [place] [address]             [phone]     HPI: Benjamin Burke returns for scheduled postoperative follow-up visit  Benjamin Burke is a 57 year old man with a history of tobacco abuse, COPD, chronic pain, cocaine use who presented with a recurrent right spontaneous pneumothorax.  He waited several days to present with right-sided chest pain and shortness of breath because he was waiting for his son's wedding.  I did a right VATS and bleb resection on 02/27/2018.  Postoperatively he did well and went home on day 5.  He does have some incisional pain and some referred pain under the right pectoral muscle.  He has not had any significant issues with his breathing.  He is continue to smoke.  He is smoking 4 to 5 cigarettes a day even though he is using a nicotine patch.  Past Medical History:  Diagnosis Date  . Chronic back pain   . Cocaine abuse ([place]) 08/09/2016   Patient caught using cocaine in his hospital room while an inpatient for spontaneous pneumothorax  . COPD (chronic obstructive pulmonary disease) ([place])   . Hypertension   . Poor dentition 09/27/2012  . Right spontaneous pneumothorax 08/07/2016  . Tobacco abuse 03/24/2012   Overview:  1 PPD x age 72.  Prior h/o smokeless tobacco but quit    Current Outpatient Medications  Medication Sig Dispense Refill  . albuterol (PROAIR HFA) 108 (90 Base) MCG/ACT inhaler Inhale 2 puffs into the lungs every 6 (six) hours as needed for wheezing or shortness of breath.    Marland Kitchen amLODipine (NORVASC) 5 MG tablet Take 1 tablet (5 mg total) by mouth daily. 30 tablet 0  . aspirin EC 81 MG tablet Take 162 mg by mouth daily as needed (pain).    . benzonatate (TESSALON) 100 MG capsule Take 1 capsule (100 mg total) by mouth 3 (three) times daily. 20 capsule 0  . ipratropium-albuterol (DUONEB) 0.5-2.5 (3) MG/3ML SOLN Take 3 mLs by nebulization every 6 (six) hours as needed (wheezing, shortness of breath). 360 mL 0  .  nicotine (NICODERM CQ - DOSED IN MG/24 HOURS) 21 mg/24hr patch Place 1 patch (21 mg total) onto the skin daily. (Patient taking differently: Place 21 mg onto the skin daily as needed (when in hospital stays). ) 28 patch 0  . oxyCODONE (OXY IR/ROXICODONE) 5 MG immediate release tablet Take 1 tablet (5 mg total) by mouth every 6 (six) hours as needed for severe pain. Take 5 mg by mouth every 4-6 hours PRN severe pain 20 tablet 0  . polyethylene glycol (MIRALAX / GLYCOLAX) packet Take 17 g by mouth daily as needed (constipation). 14 each 0  . predniSONE (DELTASONE) 20 MG tablet Take 3 tablets once daily for 3 days followed by 2 tablets once daily for 3 days followed by 1 tablet once daily for 3 days and then stop. 18 tablet 0  . senna-docusate (SENOKOT-S) 8.6-50 MG tablet Take 1 tablet by mouth at bedtime. 30 tablet 0  . gabapentin (NEURONTIN) 400 MG capsule Take 1 capsule (400 mg total) by mouth 3 (three) times daily. 90 capsule 3   No current facility-administered medications for this visit.     Physical Exam BP (!) 150/98 (BP Location: Right Arm, Patient Position: Sitting, Cuff Size: Normal)   Pulse 85   Resp 18   Ht 5\' 7"  (1.702 m)   Wt  176 lb 12.8 oz (80.2 kg)   SpO2 98% Comment: RA  BMI 27.60 kg/m  57 year old man in no acute distress Alert and oriented x3 with no focal deficits Lungs diminished bilaterally, no wheezing Incisions well-healed Cardiac regular rate and rhythm  Diagnostic Tests: CHEST - 2 VIEW  COMPARISON:  Chest x-ray of 03/15/2018 and CT chest of 08/11/2016  FINDINGS: Pleuroparenchymal opacity in the right upper lobe extending to the right apex is stable consistent with scarring. Slight pleuro diaphragmatic tenting at the right lung base is present. No pneumonia or effusion is seen. Some peribronchial thickening is present which can be seen with bronchitis. Mediastinal and hilar contours are stable and heart size is normal. No bony abnormality  is seen.  IMPRESSION: 1. Stable pleural-parenchymal scarring in the right upper lobe extending to the apex. 2. No pneumonia or pleural effusion is seen. No pneumothorax is seen.   Electronically Signed   By: Ivar Drape M.D.   On: 03/29/2018 11:20 I personally reviewed the chest x-ray images and concur with the findings noted above.  Impression: Benjamin Burke is a 57 year old gentleman with a history of tobacco abuse and COPD who presented with a recurrent right spontaneous pneumothorax.  He had a right VATS and bleb resection on 02/27/2018.  He now is a month out from surgery.  He does still have some incisional pain.  He also has some intercostal neuralgia.  He is only used the original 30 tablets of oxycodone that we gave him at discharge so far.  Therefore I do not think there is any signs of abuse.  I am going to give him another 20 oxycodone tablets to use 1 every 6 hours as needed.  No refills.  In addition to that I am going to give him prescription for gabapentin 400 mg p.o.3 times daily to see if that helps with any of the intercostal neuralgia.  Although symptoms should improve with time.  Tobacco abuse-continues to smoke despite nicotine patch.  I emphasized the importance of complete cessation.  Plan: Return in 2 months with PA and lateral chest x-ray  Melrose Nakayama, MD [facility] [phone]

## 2018-05-16 ENCOUNTER — Other Ambulatory Visit: Payer: Self-pay | Admitting: Thoracic Surgery (Cardiothoracic Vascular Surgery)

## 2018-05-16 DIAGNOSIS — J439 Emphysema, unspecified: Secondary | ICD-10-CM

## 2018-05-17 ENCOUNTER — Ambulatory Visit: Payer: Self-pay | Admitting: Thoracic Surgery (Cardiothoracic Vascular Surgery)

## 2018-05-18 ENCOUNTER — Encounter: Payer: Self-pay | Admitting: Thoracic Surgery (Cardiothoracic Vascular Surgery)

## 2018-06-21 ENCOUNTER — Ambulatory Visit
Admission: RE | Admit: 2018-06-21 | Discharge: 2018-06-21 | Disposition: A | Discharge status: Home / Self Care | Payer: Medicare HMO | Source: Ambulatory Visit | Attending: Thoracic Surgery (Cardiothoracic Vascular Surgery) | Admitting: Thoracic Surgery (Cardiothoracic Vascular Surgery)

## 2018-06-21 ENCOUNTER — Other Ambulatory Visit: Payer: Self-pay

## 2018-06-21 ENCOUNTER — Encounter: Payer: Self-pay | Admitting: Thoracic Surgery (Cardiothoracic Vascular Surgery)

## 2018-06-21 ENCOUNTER — Ambulatory Visit (INDEPENDENT_AMBULATORY_CARE_PROVIDER_SITE_OTHER): Payer: Medicare HMO | Admitting: Thoracic Surgery (Cardiothoracic Vascular Surgery)

## 2018-06-21 VITALS — BP 129/91 | HR 90 | Resp 16 | Ht 67.0 in | Wt 175.0 lb

## 2018-06-21 DIAGNOSIS — J939 Pneumothorax, unspecified: Secondary | ICD-10-CM | POA: Diagnosis not present

## 2018-06-21 DIAGNOSIS — Z09 Encounter for follow-up examination after completed treatment for conditions other than malignant neoplasm: Secondary | ICD-10-CM | POA: Diagnosis not present

## 2018-06-21 DIAGNOSIS — J439 Emphysema, unspecified: Secondary | ICD-10-CM

## 2018-06-21 NOTE — Progress Notes (Signed)
[address]       [place] [address]             [phone]     HPI: Benjamin Burke returns for a scheduled follow-up visit  Benjamin Burke is a 58 year old man with a history of heavy tobacco abuse, COPD, recurrent right spontaneous pneumothorax, polysubstance abuse including cocaine, and chronic pain.  He had right VATS and bleb resection on 02/27/2018.  His immediate postoperative course was uncomplicated.  I saw him back in late October 2019.  Overall he was doing fairly well at that point in time but he was having some intercostal neuralgia.  We started him on gabapentin.  He also was occasionally taking oxycodone at that time, but he has been off the oxycodone for over a month now.  He has not had any acute issues with his respiratory status.  He says he still has some pain right at the anteriormost aspect of his incision.  He is using nicotine patches but continues to smoke about 5 cigarettes a day.  Past Medical History:  Diagnosis Date  . Chronic back pain   . Cocaine abuse ([place]) 08/09/2016   Patient caught using cocaine in his hospital room while an inpatient for spontaneous pneumothorax  . COPD (chronic obstructive pulmonary disease) ([place])   . Hypertension   . Poor dentition 09/27/2012  . Right spontaneous pneumothorax 08/07/2016  . Tobacco abuse 03/24/2012   Overview:  1 PPD x age 55.  Prior h/o smokeless tobacco but quit    Current Outpatient Medications  Medication Sig Dispense Refill  . albuterol (PROAIR HFA) 108 (90 Base) MCG/ACT inhaler Inhale 2 puffs into the lungs every 6 (six) hours as needed for wheezing or shortness of breath.    Marland Kitchen amLODipine (NORVASC) 5 MG tablet Take 1 tablet (5 mg total) by mouth daily. 30 tablet 0  . aspirin EC 81 MG tablet Take 162 mg by mouth daily as needed (pain).    Marland Kitchen gabapentin (NEURONTIN) 400 MG capsule Take 1 capsule (400 mg total) by mouth 3 (three) times daily. 90 capsule 3  . ipratropium-albuterol (DUONEB) 0.5-2.5  (3) MG/3ML SOLN Take 3 mLs by nebulization every 6 (six) hours as needed (wheezing, shortness of breath). 360 mL 0  . nicotine (NICODERM CQ - DOSED IN MG/24 HOURS) 21 mg/24hr patch Place 1 patch (21 mg total) onto the skin daily. (Patient taking differently: Place 21 mg onto the skin daily as needed (when in hospital stays). ) 28 patch 0  . oxyCODONE (OXY IR/ROXICODONE) 5 MG immediate release tablet Take 1 tablet (5 mg total) by mouth every 6 (six) hours as needed for severe pain. Take 5 mg by mouth every 4-6 hours PRN severe pain (Patient not taking: Reported on 06/21/2018) 20 tablet 0   No current facility-administered medications for this visit.     Physical Exam BP (!) 129/91 (BP Location: Right Arm, Patient Position: Sitting, Cuff Size: Large)   Pulse 90   Resp 16   Ht 5\' 7"  (1.702 m)   Wt 175 lb (79.4 kg)   SpO2 95% Comment: ON RA  BMI 27.98 kg/m  58 year old man in no acute distress Alert and oriented x3 with no focal deficits No cervical or supraclavicular adenopathy Cardiac regular rate and rhythm Lungs mild wheezing bilaterally Incisions well-healed  Diagnostic Tests: CHEST - 2 VIEW  COMPARISON:  03/29/2018 chest radiograph  FINDINGS: The cardiomediastinal silhouette is unchanged.  RIGHT lung surgical changes are  again noted with apical scarring.  There is no evidence of pneumothorax.  There is no evidence of focal airspace disease, pulmonary edema, suspicious pulmonary nodule/mass, pleural effusion, or pneumothorax.  No acute bony abnormalities are identified.  IMPRESSION: No evidence of acute cardiopulmonary disease.   Electronically Signed   By: Margarette Canada M.D.   On: 06/21/2018 15:28 I personally reviewed the chest x-ray images and concur with the findings noted above.  Impression: Benjamin Burke is a 58 year old gentleman with a history of heavy tobacco abuse (1.5 packs/day since age 63), COPD, recurrent right spontaneous pneumothorax, polysubstance  abuse, and chronic pain.  He presented back in September with a recurrent pneumothorax.  He underwent a right VATS with blebectomy and pleural abrasion on 02/27/2018.  Not surprisingly has had some pain issues, but his pain is currently well controlled with gabapentin 400 mg daily.  He is not on any narcotics.  We will continue his gabapentin until his current prescription runs out and then see how he does without it.  He has greater than a 30-pack-year history of tobacco abuse and is 58 years old.  He has ongoing tobacco abuse although he says he is down to 4 to 5 cigarettes a day.  He meets criteria for the low-dose CT scan lung cancer screening program.  We will make a referral for that.  Plan:  Will refer to Eric Form at with our pulmonary for low-dose CT lung cancer screening program\  I will be happy to see Benjamin Burke back anytime in the future if I can be of any further assistance with his care  Melrose Nakayama, MD [facility] [phone]

## 2018-07-15 ENCOUNTER — Telehealth: Payer: Self-pay | Admitting: *Deleted

## 2018-07-15 NOTE — Telephone Encounter (Signed)
Routing to lung nodule pool.  Left detailed message making pt aware that Langley Gauss will return his call, once back in office.

## 2018-07-18 NOTE — Telephone Encounter (Signed)
LMTC x 1  

## 2018-07-21 NOTE — Telephone Encounter (Signed)
Will close this message and refer to referral notes 

## 2018-08-08 ENCOUNTER — Other Ambulatory Visit: Payer: Self-pay | Admitting: Acute Care

## 2018-08-08 DIAGNOSIS — Z122 Encounter for screening for malignant neoplasm of respiratory organs: Secondary | ICD-10-CM

## 2018-08-08 DIAGNOSIS — F17210 Nicotine dependence, cigarettes, uncomplicated: Secondary | ICD-10-CM

## 2018-08-08 DIAGNOSIS — Z87891 Personal history of nicotine dependence: Secondary | ICD-10-CM

## 2018-08-31 ENCOUNTER — Ambulatory Visit: Payer: Medicare HMO

## 2018-08-31 ENCOUNTER — Encounter: Payer: Medicare HMO | Admitting: Acute Care

## 2018-11-10 ENCOUNTER — Telehealth: Payer: Self-pay | Admitting: Acute Care

## 2018-11-10 NOTE — Telephone Encounter (Signed)
Spoke with pt and rescheduled SDMV to 11/30/18 12:00 CT will be rescheduled Nothing further needed

## 2018-11-14 ENCOUNTER — Encounter: Payer: Medicare HMO | Admitting: Acute Care

## 2018-11-14 ENCOUNTER — Ambulatory Visit: Payer: Medicare HMO

## 2018-11-30 ENCOUNTER — Encounter: Payer: Self-pay | Admitting: Acute Care

## 2018-11-30 ENCOUNTER — Other Ambulatory Visit: Payer: Self-pay

## 2018-11-30 ENCOUNTER — Ambulatory Visit (INDEPENDENT_AMBULATORY_CARE_PROVIDER_SITE_OTHER): Payer: Medicare HMO | Admitting: Acute Care

## 2018-11-30 ENCOUNTER — Ambulatory Visit
Admission: RE | Admit: 2018-11-30 | Discharge: 2018-11-30 | Disposition: A | Discharge status: Home / Self Care | Payer: 59 | Source: Ambulatory Visit | Attending: Acute Care | Admitting: Acute Care

## 2018-11-30 VITALS — BP 132/90 | HR 101 | Temp 98.0°F | Ht 67.0 in | Wt 176.0 lb

## 2018-11-30 DIAGNOSIS — Z122 Encounter for screening for malignant neoplasm of respiratory organs: Secondary | ICD-10-CM

## 2018-11-30 DIAGNOSIS — Z87891 Personal history of nicotine dependence: Secondary | ICD-10-CM

## 2018-11-30 DIAGNOSIS — F17210 Nicotine dependence, cigarettes, uncomplicated: Secondary | ICD-10-CM

## 2018-11-30 NOTE — Progress Notes (Addendum)
Shared Decision Making Visit Lung Cancer Screening Program [phone])   Eligibility:  Age 58 y.o.  Pack Years Smoking History Calculation 67.5 pack years (# packs/per year x # years smoked)  Recent History of coughing up blood  no  Unexplained weight loss? no ( >Than 15 pounds within the last 6 months )  Prior History Lung / other cancer no (Diagnosis within the last 5 years already requiring surveillance chest CT Scans).  Smoking Status Current Smoker  Former Smokers: Years since quit: NA  Quit Date: NA  Visit Components:  Discussion included one or more decision making aids. yes  Discussion included risk/benefits of screening. yes  Discussion included potential follow up diagnostic testing for abnormal scans. yes  Discussion included meaning and risk of over diagnosis. yes  Discussion included meaning and risk of False Positives. yes  Discussion included meaning of total radiation exposure. yes  Counseling Included:  Importance of adherence to annual lung cancer LDCT screening. yes  Impact of comorbidities on ability to participate in the program. yes  Ability and willingness to under diagnostic treatment. yes  Smoking Cessation Counseling:  Current Smokers:   Discussed importance of smoking cessation. yes  Information about tobacco cessation classes and interventions provided to patient. yes  Patient provided with "ticket" for LDCT Scan. yes  Symptomatic Patient. no  Counseling  Diagnosis Code: Tobacco Use Z72.0  Asymptomatic Patient yes  Counseling (Intermediate counseling: > three minutes counseling) D2202  Former Smokers:   Discussed the importance of maintaining cigarette abstinence. yes  Diagnosis Code: Personal History of Nicotine Dependence. R42.706  Information about tobacco cessation classes and interventions provided to patient. Yes  Patient provided with "ticket" for LDCT Scan. yes  Written Order for Lung Cancer Screening with LDCT  placed in Epic. Yes (CT Chest Lung Cancer Screening Low Dose W/O CM) [id number] Z12.2-Screening of respiratory organs Z87.891-Personal history of nicotine dependence  BP 132/90 (BP Location: Left Arm, Cuff Size: Normal)   Pulse (!) 101   Temp 98 F (36.7 C) (Oral)   Ht 5\' 7"  (1.702 m)   Wt 176 lb (79.8 kg)   SpO2 94%   BMI 27.57 kg/m    I have spent 25 minutes of face to face time with Mr. Worton discussing the risks and benefits of lung cancer screening. We viewed a power point together that explained in detail the above noted topics. We paused at intervals to allow for questions to be asked and answered to ensure understanding.We discussed that the single most powerful action that he can take to decrease his risk of developing lung cancer is to quit smoking. We discussed whether or not he is ready to commit to setting a quit date. We discussed options for tools to aid in quitting smoking including nicotine replacement therapy, non-nicotine medications, support groups, Quit Smart classes, and behavior modification. We discussed that often times setting smaller, more achievable goals, such as eliminating 1 cigarette a day for a week and then 2 cigarettes a day for a week can be helpful in slowly decreasing the number of cigarettes smoked. This allows for a sense of accomplishment as well as providing a clinical benefit. I gave him the " Be Stronger Than Your Excuses" card with contact information for community resources, classes, free nicotine replacement therapy, and access to mobile apps, text messaging, and on-line smoking cessation help. I have also given him my card and contact information in the event he needs to contact me. We discussed the time and location  of the scan, and that either Doroteo Glassman RN or I will call with the results within 24-48 hours of receiving them. I have offered him  a copy of the power point we viewed  as a resource in the event they need reinforcement of the concepts we  discussed today in the office. The patient verbalized understanding of all of  the above and had no further questions upon leaving the office. They have my contact information in the event they have any further questions.  I spent 5 minutes counseling on smoking cessation and the health risks of continued tobacco abuse.  I explained to the patient that there has been a high incidence of coronary artery disease noted on these exams. I explained that this is a non-gated exam therefore degree or severity cannot be determined. This patient is not currently on statin therapy. I have asked the patient to follow-up with their PCP regarding any incidental finding of coronary artery disease and management with diet or medication as their PCP  feels is clinically indicated. The patient verbalized understanding of the above and had no further questions upon completion of the visit.      Magdalen Spatz, NP 11/30/2018 12:15 PM

## 2018-12-09 ENCOUNTER — Other Ambulatory Visit: Payer: Self-pay | Admitting: *Deleted

## 2018-12-09 DIAGNOSIS — Z122 Encounter for screening for malignant neoplasm of respiratory organs: Secondary | ICD-10-CM

## 2018-12-09 DIAGNOSIS — F17210 Nicotine dependence, cigarettes, uncomplicated: Secondary | ICD-10-CM

## 2018-12-09 DIAGNOSIS — Z87891 Personal history of nicotine dependence: Secondary | ICD-10-CM

## 2019-12-13 ENCOUNTER — Ambulatory Visit
Admission: RE | Admit: 2019-12-13 | Discharge: 2019-12-13 | Disposition: A | Discharge status: Home / Self Care | Payer: Medicare HMO | Source: Ambulatory Visit | Attending: Acute Care | Admitting: Acute Care

## 2019-12-13 DIAGNOSIS — F17210 Nicotine dependence, cigarettes, uncomplicated: Secondary | ICD-10-CM

## 2019-12-13 DIAGNOSIS — Z87891 Personal history of nicotine dependence: Secondary | ICD-10-CM

## 2019-12-13 DIAGNOSIS — Z122 Encounter for screening for malignant neoplasm of respiratory organs: Secondary | ICD-10-CM

## 2019-12-14 ENCOUNTER — Other Ambulatory Visit: Payer: Self-pay | Admitting: *Deleted

## 2019-12-14 DIAGNOSIS — Z87891 Personal history of nicotine dependence: Secondary | ICD-10-CM

## 2019-12-14 DIAGNOSIS — F17210 Nicotine dependence, cigarettes, uncomplicated: Secondary | ICD-10-CM

## 2019-12-14 NOTE — Progress Notes (Signed)
Please call patient and let them  know their  low dose Ct was read as a Lung RADS 1, negative study: no nodules or definitely benign nodules. Radiology recommendation is for a repeat LDCT in 12 months. .Please let them  know we will order and schedule their  annual screening scan for 11/2020. Please let them  know there was notation of CAD on their  scan.  Please remind the patient  that this is a non-gated exam therefore degree or severity of disease  cannot be determined. Please have them  follow up with their PCP regarding potential risk factor modification, dietary therapy or pharmacologic therapy if clinically indicated. Pt.  is not currently on statin therapy. Please place order for annual  screening scan for  11/2020 and fax results to PCP. Thanks so much.  Please note the 3 vessel coronary artery atherosclerotic calcifications. Please encourage patient to follow up with PCP. Thanks

## 2020-06-17 IMAGING — DX DG CHEST 1V PORT
1 series · 1 of 1 positions shown · non-contrast
Comparison: 02/28/2018

CLINICAL DATA: Follow-up chest tube

EXAM:
PORTABLE CHEST 1 VIEW

[chest]
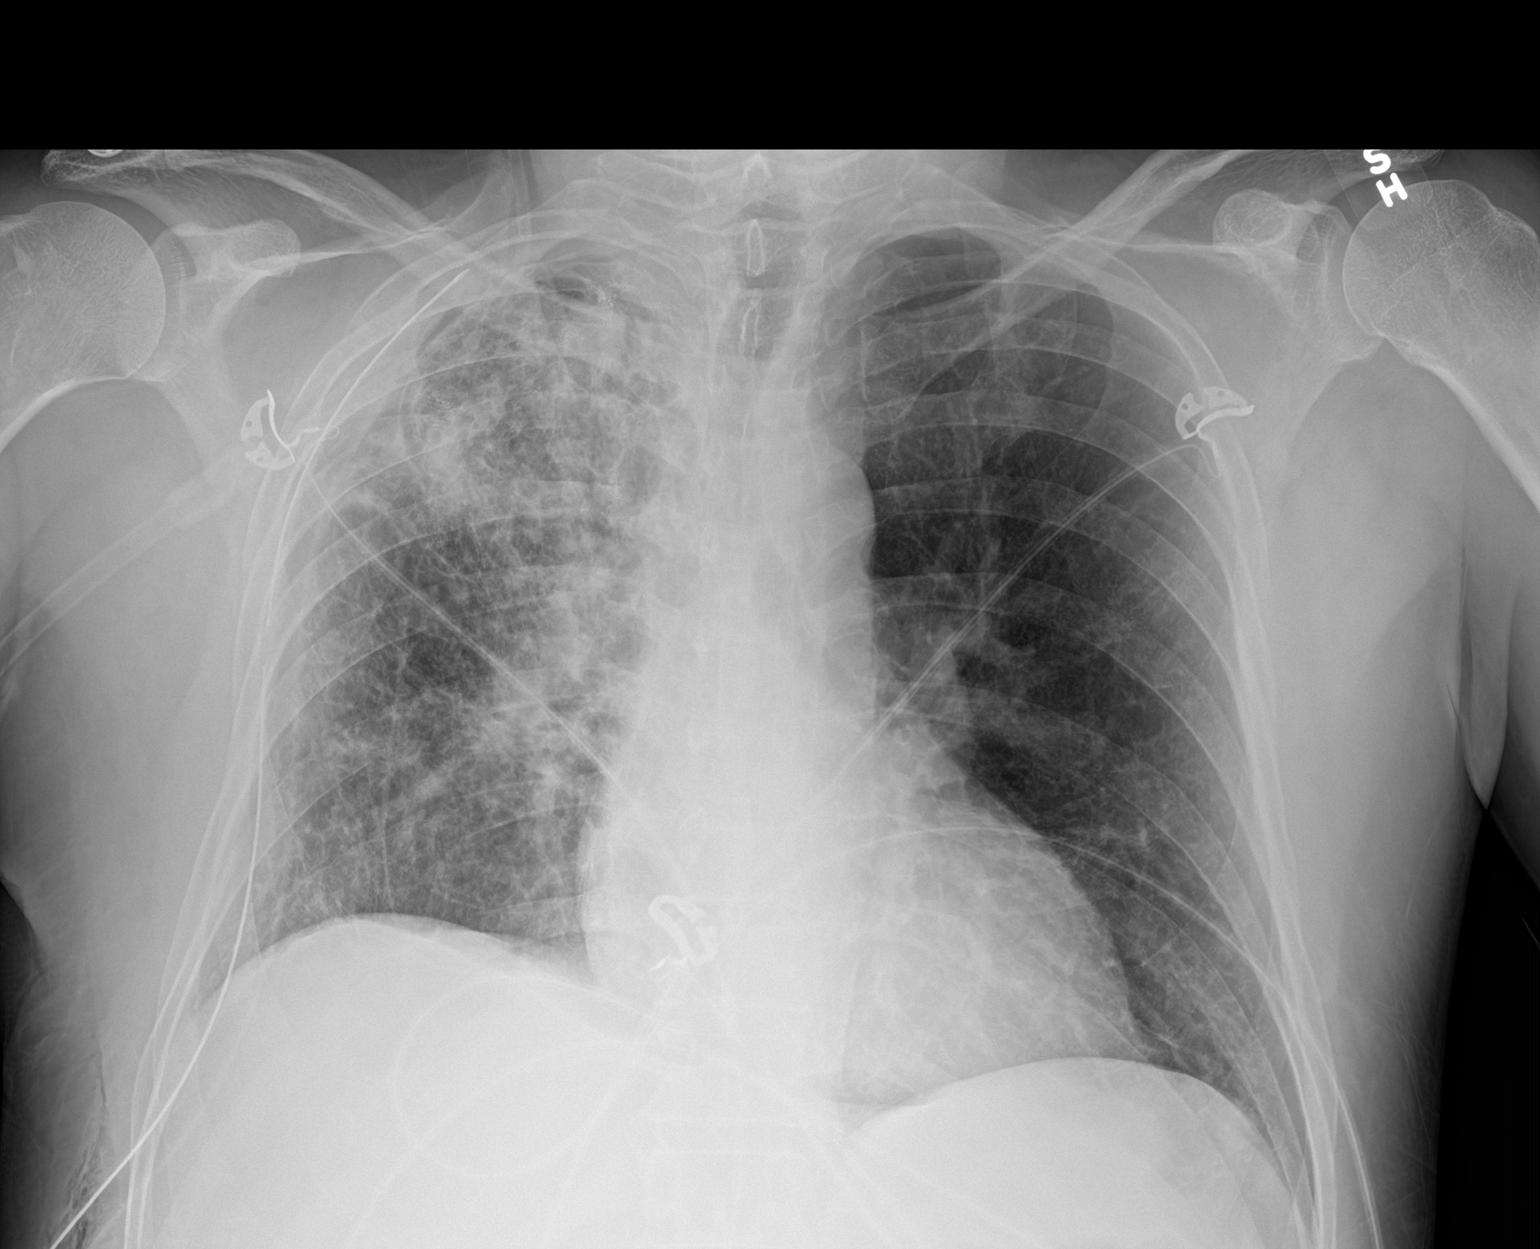

[1 of 1 positions shown; findings below may reference images not displayed]

FINDINGS: Cardiac shadow is within normal limits. Right chest tube is noted in
satisfactory position. Postsurgical changes are noted. Small apical
pneumothorax is seen on the right although likely predominately
related to the size of the thoracostomy tube. Persistent right upper
lobe airspace disease is noted and stable. No new focal infiltrate
is seen.
IMPRESSION: Small right apical pneumothorax likely related to the size of the
underlying thoracostomy catheter.

Stable right upper lobe airspace disease.

## 2020-07-24 ENCOUNTER — Inpatient Hospital Stay (HOSPITAL_COMMUNITY): Payer: Medicare Other

## 2020-07-24 ENCOUNTER — Emergency Department (HOSPITAL_COMMUNITY): Payer: Medicare Other

## 2020-07-24 ENCOUNTER — Inpatient Hospital Stay (HOSPITAL_COMMUNITY)
Admission: EM | Admit: 2020-07-24 | Discharge: 2020-08-30 | DRG: 917 | Disposition: E | Payer: Medicare Other | Attending: Pulmonary Disease | Admitting: Pulmonary Disease

## 2020-07-24 DIAGNOSIS — Z841 Family history of disorders of kidney and ureter: Secondary | ICD-10-CM

## 2020-07-24 DIAGNOSIS — N179 Acute kidney failure, unspecified: Secondary | ICD-10-CM | POA: Diagnosis not present

## 2020-07-24 DIAGNOSIS — Z8261 Family history of arthritis: Secondary | ICD-10-CM

## 2020-07-24 DIAGNOSIS — E785 Hyperlipidemia, unspecified: Secondary | ICD-10-CM | POA: Diagnosis present

## 2020-07-24 DIAGNOSIS — T405X1A Poisoning by cocaine, accidental (unintentional), initial encounter: Principal | ICD-10-CM | POA: Diagnosis present

## 2020-07-24 DIAGNOSIS — G8929 Other chronic pain: Secondary | ICD-10-CM | POA: Diagnosis present

## 2020-07-24 DIAGNOSIS — C44529 Squamous cell carcinoma of skin of other part of trunk: Secondary | ICD-10-CM | POA: Diagnosis present

## 2020-07-24 DIAGNOSIS — I110 Hypertensive heart disease with heart failure: Secondary | ICD-10-CM | POA: Diagnosis present

## 2020-07-24 DIAGNOSIS — N170 Acute kidney failure with tubular necrosis: Secondary | ICD-10-CM | POA: Diagnosis present

## 2020-07-24 DIAGNOSIS — J690 Pneumonitis due to inhalation of food and vomit: Secondary | ICD-10-CM | POA: Diagnosis present

## 2020-07-24 DIAGNOSIS — E876 Hypokalemia: Secondary | ICD-10-CM | POA: Diagnosis not present

## 2020-07-24 DIAGNOSIS — J9621 Acute and chronic respiratory failure with hypoxia: Secondary | ICD-10-CM | POA: Diagnosis present

## 2020-07-24 DIAGNOSIS — Z978 Presence of other specified devices: Secondary | ICD-10-CM | POA: Diagnosis not present

## 2020-07-24 DIAGNOSIS — I472 Ventricular tachycardia: Secondary | ICD-10-CM | POA: Diagnosis not present

## 2020-07-24 DIAGNOSIS — I429 Cardiomyopathy, unspecified: Secondary | ICD-10-CM | POA: Diagnosis present

## 2020-07-24 DIAGNOSIS — F1210 Cannabis abuse, uncomplicated: Secondary | ICD-10-CM | POA: Diagnosis present

## 2020-07-24 DIAGNOSIS — G928 Other toxic encephalopathy: Secondary | ICD-10-CM | POA: Diagnosis present

## 2020-07-24 DIAGNOSIS — T17800S Unspecified foreign body in other parts of respiratory tract causing asphyxiation, sequela: Secondary | ICD-10-CM

## 2020-07-24 DIAGNOSIS — R778 Other specified abnormalities of plasma proteins: Secondary | ICD-10-CM | POA: Diagnosis not present

## 2020-07-24 DIAGNOSIS — I6300 Cerebral infarction due to thrombosis of unspecified precerebral artery: Secondary | ICD-10-CM | POA: Diagnosis not present

## 2020-07-24 DIAGNOSIS — I639 Cerebral infarction, unspecified: Secondary | ICD-10-CM | POA: Diagnosis not present

## 2020-07-24 DIAGNOSIS — F1410 Cocaine abuse, uncomplicated: Secondary | ICD-10-CM | POA: Diagnosis not present

## 2020-07-24 DIAGNOSIS — Z833 Family history of diabetes mellitus: Secondary | ICD-10-CM

## 2020-07-24 DIAGNOSIS — F17210 Nicotine dependence, cigarettes, uncomplicated: Secondary | ICD-10-CM | POA: Diagnosis present

## 2020-07-24 DIAGNOSIS — I6340 Cerebral infarction due to embolism of unspecified cerebral artery: Secondary | ICD-10-CM | POA: Diagnosis not present

## 2020-07-24 DIAGNOSIS — Z515 Encounter for palliative care: Secondary | ICD-10-CM

## 2020-07-24 DIAGNOSIS — I5021 Acute systolic (congestive) heart failure: Secondary | ICD-10-CM | POA: Diagnosis not present

## 2020-07-24 DIAGNOSIS — G931 Anoxic brain damage, not elsewhere classified: Secondary | ICD-10-CM | POA: Diagnosis not present

## 2020-07-24 DIAGNOSIS — M549 Dorsalgia, unspecified: Secondary | ICD-10-CM | POA: Diagnosis present

## 2020-07-24 DIAGNOSIS — Z8249 Family history of ischemic heart disease and other diseases of the circulatory system: Secondary | ICD-10-CM

## 2020-07-24 DIAGNOSIS — Z20822 Contact with and (suspected) exposure to covid-19: Secondary | ICD-10-CM | POA: Diagnosis present

## 2020-07-24 DIAGNOSIS — Z823 Family history of stroke: Secondary | ICD-10-CM

## 2020-07-24 DIAGNOSIS — Z79899 Other long term (current) drug therapy: Secondary | ICD-10-CM

## 2020-07-24 DIAGNOSIS — R570 Cardiogenic shock: Secondary | ICD-10-CM | POA: Diagnosis not present

## 2020-07-24 DIAGNOSIS — R4182 Altered mental status, unspecified: Secondary | ICD-10-CM

## 2020-07-24 DIAGNOSIS — I63532 Cerebral infarction due to unspecified occlusion or stenosis of left posterior cerebral artery: Secondary | ICD-10-CM | POA: Diagnosis present

## 2020-07-24 DIAGNOSIS — A419 Sepsis, unspecified organism: Secondary | ICD-10-CM

## 2020-07-24 DIAGNOSIS — G9340 Encephalopathy, unspecified: Secondary | ICD-10-CM | POA: Diagnosis not present

## 2020-07-24 DIAGNOSIS — J9601 Acute respiratory failure with hypoxia: Secondary | ICD-10-CM

## 2020-07-24 DIAGNOSIS — T17800A Unspecified foreign body in other parts of respiratory tract causing asphyxiation, initial encounter: Secondary | ICD-10-CM

## 2020-07-24 DIAGNOSIS — J449 Chronic obstructive pulmonary disease, unspecified: Secondary | ICD-10-CM | POA: Diagnosis present

## 2020-07-24 DIAGNOSIS — R233 Spontaneous ecchymoses: Secondary | ICD-10-CM | POA: Diagnosis present

## 2020-07-24 DIAGNOSIS — F17200 Nicotine dependence, unspecified, uncomplicated: Secondary | ICD-10-CM | POA: Diagnosis not present

## 2020-07-24 DIAGNOSIS — D72829 Elevated white blood cell count, unspecified: Secondary | ICD-10-CM | POA: Diagnosis not present

## 2020-07-24 DIAGNOSIS — Z886 Allergy status to analgesic agent status: Secondary | ICD-10-CM

## 2020-07-24 DIAGNOSIS — R29724 NIHSS score 24: Secondary | ICD-10-CM | POA: Diagnosis present

## 2020-07-24 DIAGNOSIS — Z7982 Long term (current) use of aspirin: Secondary | ICD-10-CM

## 2020-07-24 DIAGNOSIS — F1010 Alcohol abuse, uncomplicated: Secondary | ICD-10-CM | POA: Diagnosis not present

## 2020-07-24 DIAGNOSIS — I2510 Atherosclerotic heart disease of native coronary artery without angina pectoris: Secondary | ICD-10-CM | POA: Diagnosis present

## 2020-07-24 DIAGNOSIS — I471 Supraventricular tachycardia: Secondary | ICD-10-CM | POA: Diagnosis not present

## 2020-07-24 DIAGNOSIS — I214 Non-ST elevation (NSTEMI) myocardial infarction: Secondary | ICD-10-CM | POA: Diagnosis present

## 2020-07-24 DIAGNOSIS — Z66 Do not resuscitate: Secondary | ICD-10-CM | POA: Diagnosis not present

## 2020-07-24 DIAGNOSIS — Z83438 Family history of other disorder of lipoprotein metabolism and other lipidemia: Secondary | ICD-10-CM

## 2020-07-24 DIAGNOSIS — Z809 Family history of malignant neoplasm, unspecified: Secondary | ICD-10-CM

## 2020-07-24 DIAGNOSIS — Z7189 Other specified counseling: Secondary | ICD-10-CM | POA: Diagnosis not present

## 2020-07-24 DIAGNOSIS — R401 Stupor: Secondary | ICD-10-CM | POA: Diagnosis not present

## 2020-07-24 DIAGNOSIS — I6389 Other cerebral infarction: Secondary | ICD-10-CM | POA: Diagnosis not present

## 2020-07-24 LAB — RESP PANEL BY RT-PCR (FLU A&B, COVID) ARPGX2
Influenza A by PCR: NEGATIVE
Influenza B by PCR: NEGATIVE
SARS Coronavirus 2 by RT PCR: NEGATIVE

## 2020-07-24 LAB — MAGNESIUM: Magnesium: 1.7 mg/dL (ref 1.7–2.4)

## 2020-07-24 LAB — I-STAT CHEM 8, ED
BUN: 28 mg/dL — ABNORMAL HIGH (ref 6–20)
Calcium, Ion: 0.97 mmol/L — ABNORMAL LOW (ref 1.15–1.40)
Chloride: 106 mmol/L (ref 98–111)
Creatinine, Ser: 2.7 mg/dL — ABNORMAL HIGH (ref 0.61–1.24)
Glucose, Bld: 140 mg/dL — ABNORMAL HIGH (ref 70–99)
HCT: 54 % — ABNORMAL HIGH (ref 39.0–52.0)
Hemoglobin: 18.4 g/dL — ABNORMAL HIGH (ref 13.0–17.0)
Potassium: 5.3 mmol/L — ABNORMAL HIGH (ref 3.5–5.1)
Sodium: 140 mmol/L (ref 135–145)
TCO2: 22 mmol/L (ref 22–32)

## 2020-07-24 LAB — RAPID URINE DRUG SCREEN, HOSP PERFORMED
Amphetamines: NOT DETECTED
Barbiturates: NOT DETECTED
Benzodiazepines: NOT DETECTED
Cocaine: POSITIVE — AB
Opiates: NOT DETECTED
Tetrahydrocannabinol: POSITIVE — AB

## 2020-07-24 LAB — COMPREHENSIVE METABOLIC PANEL
ALT: 44 U/L (ref 0–44)
ALT: 55 U/L — ABNORMAL HIGH (ref 0–44)
AST: 50 U/L — ABNORMAL HIGH (ref 15–41)
AST: 74 U/L — ABNORMAL HIGH (ref 15–41)
Albumin: 4 g/dL (ref 3.5–5.0)
Albumin: 3.4 g/dL — ABNORMAL LOW (ref 3.5–5.0)
Alkaline Phosphatase: 88 U/L (ref 38–126)
Alkaline Phosphatase: 77 U/L (ref 38–126)
Anion gap: 16 — ABNORMAL HIGH (ref 5–15)
Anion gap: 13 (ref 5–15)
BUN: 23 mg/dL — ABNORMAL HIGH (ref 6–20)
BUN: 23 mg/dL — ABNORMAL HIGH (ref 6–20)
CO2: 22 mmol/L (ref 22–32)
CO2: 21 mmol/L — ABNORMAL LOW (ref 22–32)
Calcium: 9 mg/dL (ref 8.9–10.3)
Calcium: 7.8 mg/dL — ABNORMAL LOW (ref 8.9–10.3)
Chloride: 102 mmol/L (ref 98–111)
Chloride: 106 mmol/L (ref 98–111)
Creatinine, Ser: 2.76 mg/dL — ABNORMAL HIGH (ref 0.61–1.24)
Creatinine, Ser: 2.48 mg/dL — ABNORMAL HIGH (ref 0.61–1.24)
GFR, Estimated: 26 mL/min — ABNORMAL LOW (ref >60)
GFR, Estimated: 30 mL/min — ABNORMAL LOW (ref >60)
Glucose, Bld: 141 mg/dL — ABNORMAL HIGH (ref 70–99)
Glucose, Bld: 148 mg/dL — ABNORMAL HIGH (ref 70–99)
Potassium: 5.4 mmol/L — ABNORMAL HIGH (ref 3.5–5.1)
Potassium: 5.1 mmol/L (ref 3.5–5.1)
Sodium: 140 mmol/L (ref 135–145)
Sodium: 140 mmol/L (ref 135–145)
Total Bilirubin: 0.6 mg/dL (ref 0.3–1.2)
Total Bilirubin: 0.9 mg/dL (ref 0.3–1.2)
Total Protein: 7.5 g/dL (ref 6.5–8.1)
Total Protein: 6.6 g/dL (ref 6.5–8.1)

## 2020-07-24 LAB — ETHANOL: Alcohol, Ethyl (B): <10 mg/dL (ref <10)

## 2020-07-24 LAB — CBC WITH DIFFERENTIAL/PLATELET
Abs Immature Granulocytes: 0.1 10*3/uL — ABNORMAL HIGH (ref 0.00–0.07)
Basophils Absolute: 0 10*3/uL (ref 0.0–0.1)
Basophils Relative: 0 %
Eosinophils Absolute: 0 10*3/uL (ref 0.0–0.5)
Eosinophils Relative: 0 %
HCT: 51.5 % (ref 39.0–52.0)
Hemoglobin: 17 g/dL (ref 13.0–17.0)
Immature Granulocytes: 1 %
Lymphocytes Relative: 9 %
Lymphs Abs: 1.4 10*3/uL (ref 0.7–4.0)
MCH: 30.6 pg (ref 26.0–34.0)
MCHC: 33 g/dL (ref 30.0–36.0)
MCV: 92.8 fL (ref 80.0–100.0)
Monocytes Absolute: 1.5 10*3/uL — ABNORMAL HIGH (ref 0.1–1.0)
Monocytes Relative: 10 %
Neutro Abs: 12.6 10*3/uL — ABNORMAL HIGH (ref 1.7–7.7)
Neutrophils Relative %: 80 %
Platelets: 365 10*3/uL (ref 150–400)
RBC: 5.55 MIL/uL (ref 4.22–5.81)
RDW: 14.9 % (ref 11.5–15.5)
WBC: 15.6 10*3/uL — ABNORMAL HIGH (ref 4.0–10.5)
nRBC: 0 % (ref 0.0–0.2)

## 2020-07-24 LAB — TROPONIN I (HIGH SENSITIVITY)
Troponin I (High Sensitivity): 1605 ng/L (ref <18)
Troponin I (High Sensitivity): 6484 ng/L (ref <18)
Troponin I (High Sensitivity): 6476 ng/L (ref <18)

## 2020-07-24 LAB — URINALYSIS, ROUTINE W REFLEX MICROSCOPIC
Bilirubin Urine: NEGATIVE
Glucose, UA: NEGATIVE mg/dL
Ketones, ur: NEGATIVE mg/dL
Leukocytes,Ua: NEGATIVE
Nitrite: NEGATIVE
Protein, ur: 30 mg/dL — AB
Specific Gravity, Urine: 1.023 (ref 1.005–1.030)
pH: 5 (ref 5.0–8.0)

## 2020-07-24 LAB — GLUCOSE, CAPILLARY
Glucose-Capillary: 122 mg/dL — ABNORMAL HIGH (ref 70–99)
Glucose-Capillary: 156 mg/dL — ABNORMAL HIGH (ref 70–99)
Glucose-Capillary: 114 mg/dL — ABNORMAL HIGH (ref 70–99)
Glucose-Capillary: 89 mg/dL (ref 70–99)

## 2020-07-24 LAB — MRSA PCR SCREENING: MRSA by PCR: NEGATIVE

## 2020-07-24 LAB — PROTIME-INR
INR: 1.1 (ref 0.8–1.2)
INR: 1.1 (ref 0.8–1.2)
Prothrombin Time: 13.5 seconds (ref 11.4–15.2)
Prothrombin Time: 13.7 seconds (ref 11.4–15.2)

## 2020-07-24 LAB — I-STAT ARTERIAL BLOOD GAS, ED
Acid-base deficit: 6 mmol/L — ABNORMAL HIGH (ref 0.0–2.0)
Bicarbonate: 21.3 mmol/L (ref 20.0–28.0)
Calcium, Ion: 1.05 mmol/L — ABNORMAL LOW (ref 1.15–1.40)
HCT: 45 % (ref 39.0–52.0)
Hemoglobin: 15.3 g/dL (ref 13.0–17.0)
O2 Saturation: 100 %
Patient temperature: 99.9
Potassium: 4.1 mmol/L (ref 3.5–5.1)
Sodium: 143 mmol/L (ref 135–145)
TCO2: 23 mmol/L (ref 22–32)
pCO2 arterial: 50 mmHg — ABNORMAL HIGH (ref 32.0–48.0)
pH, Arterial: 7.241 — ABNORMAL LOW (ref 7.350–7.450)
pO2, Arterial: 418 mmHg — ABNORMAL HIGH (ref 83.0–108.0)

## 2020-07-24 LAB — CBC
HCT: 51.8 % (ref 39.0–52.0)
Hemoglobin: 16.2 g/dL (ref 13.0–17.0)
MCH: 29.7 pg (ref 26.0–34.0)
MCHC: 31.3 g/dL (ref 30.0–36.0)
MCV: 94.9 fL (ref 80.0–100.0)
Platelets: 309 10*3/uL (ref 150–400)
RBC: 5.46 MIL/uL (ref 4.22–5.81)
RDW: 14.9 % (ref 11.5–15.5)
WBC: 20.8 10*3/uL — ABNORMAL HIGH (ref 4.0–10.5)
nRBC: 0 % (ref 0.0–0.2)

## 2020-07-24 LAB — PROCALCITONIN: Procalcitonin: 7.2 ng/mL

## 2020-07-24 LAB — APTT: aPTT: 29 seconds (ref 24–36)

## 2020-07-24 LAB — LACTIC ACID, PLASMA
Lactic Acid, Venous: 3 mmol/L (ref 0.5–1.9)
Lactic Acid, Venous: 3.3 mmol/L (ref 0.5–1.9)
Lactic Acid, Venous: 3.2 mmol/L (ref 0.5–1.9)

## 2020-07-24 LAB — AMMONIA: Ammonia: 43 umol/L — ABNORMAL HIGH (ref 9–35)

## 2020-07-24 LAB — HIV ANTIBODY (ROUTINE TESTING W REFLEX): HIV Screen 4th Generation wRfx: NONREACTIVE

## 2020-07-24 LAB — CORTISOL: Cortisol, Plasma: 48.9 ug/dL

## 2020-07-24 LAB — LIPASE, BLOOD: Lipase: 32 U/L (ref 11–51)

## 2020-07-24 LAB — AMYLASE: Amylase: 614 U/L — ABNORMAL HIGH (ref 28–100)

## 2020-07-24 LAB — PHOSPHORUS: Phosphorus: 5 mg/dL — ABNORMAL HIGH (ref 2.5–4.6)

## 2020-07-24 LAB — CBG MONITORING, ED: Glucose-Capillary: 149 mg/dL — ABNORMAL HIGH (ref 70–99)

## 2020-07-24 LAB — CK: Total CK: 294 U/L (ref 49–397)

## 2020-07-24 MED ORDER — SODIUM CHLORIDE 0.9 % IV BOLUS (SEPSIS)
1000.0000 mL | Freq: Once | INTRAVENOUS | Status: AC
  Administered 2020-07-24: 1000 mL via INTRAVENOUS

## 2020-07-24 MED ORDER — FENTANYL CITRATE (PF) 100 MCG/2ML IJ SOLN
100.0000 ug | INTRAMUSCULAR | Status: DC | PRN
  Administered 2020-07-25 – 2020-08-01 (×15): 100 ug via INTRAVENOUS
  Filled 2020-07-24 (×7): qty 2

## 2020-07-24 MED ORDER — POLYETHYLENE GLYCOL 3350 17 G PO PACK: 17.0000 g | PACK | Freq: Every day | ORAL | Status: DC | PRN

## 2020-07-24 MED ORDER — STROKE: EARLY STAGES OF RECOVERY BOOK
Freq: Once | Status: AC
  Filled 2020-07-24: qty 1

## 2020-07-24 MED ORDER — SODIUM CHLORIDE 0.9 % IV SOLN
2.0000 g | Freq: Once | INTRAVENOUS | Status: AC
  Administered 2020-07-24: 2 g via INTRAVENOUS
  Filled 2020-07-24: qty 2

## 2020-07-24 MED ORDER — SODIUM CHLORIDE 0.9 % IV SOLN: INTRAVENOUS | Status: AC

## 2020-07-24 MED ORDER — SODIUM CHLORIDE 0.9 % IV SOLN: 2.0000 g | INTRAVENOUS | Status: DC

## 2020-07-24 MED ORDER — SODIUM CHLORIDE 0.9 % IV BOLUS (SEPSIS)
500.0000 mL | Freq: Once | INTRAVENOUS | Status: AC
  Administered 2020-07-24: 500 mL via INTRAVENOUS

## 2020-07-24 MED ORDER — ORAL CARE MOUTH RINSE
15.0000 mL | OROMUCOSAL | Status: DC
  Administered 2020-07-24 – 2020-08-01 (×78): 15 mL via OROMUCOSAL

## 2020-07-24 MED ORDER — SODIUM CHLORIDE 0.9 % IV SOLN
INTRAVENOUS | Status: DC
Start: 2020-07-24 — End: 2020-07-25

## 2020-07-24 MED ORDER — PANTOPRAZOLE SODIUM 40 MG IV SOLR
40.0000 mg | Freq: Every day | INTRAVENOUS | Status: DC
Start: 2020-07-24 — End: 2020-07-24

## 2020-07-24 MED ORDER — DOCUSATE SODIUM 100 MG PO CAPS: 100.0000 mg | ORAL_CAPSULE | Freq: Two times a day (BID) | ORAL | Status: DC | PRN

## 2020-07-24 MED ORDER — VANCOMYCIN HCL 1500 MG/300ML IV SOLN: 1500.0000 mg | INTRAVENOUS | Status: DC

## 2020-07-24 MED ORDER — VANCOMYCIN HCL 2000 MG/400ML IV SOLN
2000.0000 mg | Freq: Once | INTRAVENOUS | Status: AC
  Administered 2020-07-24: 2000 mg via INTRAVENOUS
  Filled 2020-07-24: qty 400

## 2020-07-24 MED ORDER — METRONIDAZOLE IN NACL 5-0.79 MG/ML-% IV SOLN
500.0000 mg | Freq: Once | INTRAVENOUS | Status: AC
  Administered 2020-07-24: 500 mg via INTRAVENOUS
  Filled 2020-07-24: qty 100

## 2020-07-24 MED ORDER — ROCURONIUM BROMIDE 50 MG/5ML IV SOLN
INTRAVENOUS | Status: AC | PRN
  Administered 2020-07-24: 80 mg via INTRAVENOUS

## 2020-07-24 MED ORDER — LACTATED RINGERS IV SOLN: INTRAVENOUS | Status: DC

## 2020-07-24 MED ORDER — CHLORHEXIDINE GLUCONATE 0.12% ORAL RINSE (MEDLINE KIT)
15.0000 mL | Freq: Two times a day (BID) | OROMUCOSAL | Status: DC
  Administered 2020-07-24 – 2020-07-31 (×16): 15 mL via OROMUCOSAL

## 2020-07-24 MED ORDER — ETOMIDATE 2 MG/ML IV SOLN
INTRAVENOUS | Status: AC | PRN
  Administered 2020-07-24: 20 mg via INTRAVENOUS

## 2020-07-24 MED ORDER — PANTOPRAZOLE SODIUM 40 MG IV SOLR
40.0000 mg | Freq: Every day | INTRAVENOUS | Status: DC
  Administered 2020-07-24: 40 mg via INTRAVENOUS
  Filled 2020-07-24: qty 40

## 2020-07-24 MED ORDER — MAGNESIUM SULFATE 2 GM/50ML IV SOLN
2.0000 g | Freq: Once | INTRAVENOUS | Status: AC
  Administered 2020-07-24: 2 g via INTRAVENOUS
  Filled 2020-07-24: qty 50

## 2020-07-24 MED ORDER — CHLORHEXIDINE GLUCONATE CLOTH 2 % EX PADS
6.0000 | MEDICATED_PAD | Freq: Every day | CUTANEOUS | Status: DC
  Administered 2020-07-25 – 2020-08-01 (×7): 6 via TOPICAL

## 2020-07-24 MED ORDER — ACETAMINOPHEN 650 MG RE SUPP
650.0000 mg | Freq: Once | RECTAL | Status: AC
  Administered 2020-07-24: 650 mg via RECTAL
  Filled 2020-07-24: qty 1

## 2020-07-24 MED ORDER — PROPOFOL 1000 MG/100ML IV EMUL
0.0000 ug/kg/min | INTRAVENOUS | Status: DC
  Administered 2020-07-24: 20 ug/kg/min via INTRAVENOUS
  Administered 2020-07-25: 10 ug/kg/min via INTRAVENOUS
  Administered 2020-07-25 – 2020-07-26 (×2): 20 ug/kg/min via INTRAVENOUS
  Filled 2020-07-24 (×5): qty 100

## 2020-07-24 MED ORDER — FENTANYL CITRATE (PF) 100 MCG/2ML IJ SOLN
100.0000 ug | INTRAMUSCULAR | Status: DC | PRN
  Administered 2020-07-25: 100 ug via INTRAVENOUS
  Filled 2020-07-24: qty 2

## 2020-07-24 NOTE — ED Triage Notes (Signed)
Pt bib ems from home after family found pt unresponsive laying in bed. LKW 2300 yesterday. Given 2mg  Narcan without improvement. Arrives on NRB with oxygen saturations of 98%. HR 115, RR 26, BP 125/79. 18g LAC.

## 2020-07-24 NOTE — Progress Notes (Signed)
Pharmacy Antibiotic Note  Benjamin Burke is a 60 y.o. male admitted on 07/12/2020 with sepsis.  Pharmacy has been consulted for vancomycin/cefepime dosing. SCr 2.76 on admit (baseline ~0.8-1.1).  Plan: Cefepime 2g IV x 1; then 2g IV q24h Vancomycin 2g IV x1; then 1500mg  IV q48h. Goal AUC 400-550. Expected AUC: 491 SCr used: 2.76 Flagyl 500mg  IV x 1 per EDP - f/u if to continue Monitor clinical progress, c/s, renal function F/u de-escalation plan/LOT, vancomycin levels as indicated      No data recorded.  No results for input(s): WBC, CREATININE, LATICACIDVEN, VANCOTROUGH, VANCOPEAK, VANCORANDOM, GENTTROUGH, GENTPEAK, GENTRANDOM, TOBRATROUGH, TOBRAPEAK, TOBRARND, AMIKACINPEAK, AMIKACINTROU, AMIKACIN in the last 168 hours.  CrCl cannot be calculated (Patient's most recent lab result is older than the maximum 21 days allowed.).    Allergies  Allergen Reactions  . Tylenol [Acetaminophen] Nausea And Vomiting    Antimicrobials this admission: 2/23 vancomycin >>  2/23 cefepime >>  2/23 flagyl x 1  Dose adjustments this admission:   Microbiology results:   Arturo Morton, PharmD, BCPS Please check AMION for all [facility] contact numbers Clinical Pharmacist 07/23/2020 9:47 AM

## 2020-07-24 NOTE — Sepsis Progress Note (Signed)
Notified provider of need to order another lactic acid as the second result was higher than the first.

## 2020-07-24 NOTE — ED Provider Notes (Signed)
I provided a substantive portion of the care of this patient.  I personally performed the entirety of the medical decision making for this encounter.  EKG Interpretation  Date/Time:  Wednesday July 24 2020 09:35:16 EST Ventricular Rate:  112 PR Interval:    QRS Duration: 92 QT Interval:  325 QTC Calculation: 444 R Axis:   148 Text Interpretation: Sinus tachycardia Right axis deviation Abnormal R-wave progression, late transition Confirmed by Lacretia Leigh ([id number]) on 07/04/2020 9:19:77 AM  60 year old male who presents via EMS due to altered mental status.  Last seen normal was almost 12 hours ago.  CBG was above 100.  On exam here the patient is maintaining his saturations.  He is febrile here and code sepsis initiated.  Patient given IV fluid bolus as well as started on antibiotics and will be admitted to the hospital   Lacretia Leigh, MD 07/17/2020 1006

## 2020-07-24 NOTE — H&P (Signed)
NAME:  Benjamin Burke, MRN:  [id number], DOB:  25-Mar-1961, LOS: 0 ADMISSION DATE:  07/29/2020, CONSULTATION DATE:  07/15/2020 REFERRING MD:  edp, CHIEF COMPLAINT:  Code sepsis   Brief History:  60 year old with known history of cocaine abuse admitted with altered mental status fever.  History of Present Illness:  HPI obtained from medical chart review as patient is encephalopathic and intubated on mechanical ventilation.   60 year old male with prior history significant for but not limited to tobacco abuse, cocaine abuse, HTN, and chronic back pain, found unresponsive laying in bed by family, LKW 43 yesterday.  No response to narcan with EMS.   Arrived to the ER on NRB with acceptable saturations of 98%, febrile 100.8 rectally, normotensive but tachycardic.  Code sepsis initiated. Workup notable for K 5.3, glucose 140, BUN 28, sCr 2.7, lactic 3, WBC 15.6, normal coags, ammonia 43, pending LFTs, UA/ UDS, trop hs, CK, and covid pending.  Cultures sent and started on empiric cefepime, flagyl, and vancomycin coverage. Required intubation for airway protection in ER. CT head showing hypo and hyperdensities in the superior left occipital lobe concerning for acute to subacute left PCA territory infarct with petechial hemorrhage and questionable hyperdense left PCA; MRI recommended for further evaluation. Neurology consulted given head CT findings.  PCCM called for admit.       Past Medical History:   Past Medical History:  Diagnosis Date  . Chronic back pain   . Cocaine abuse ([place]) 08/09/2016   Patient caught using cocaine in his hospital room while an inpatient for spontaneous pneumothorax  . COPD (chronic obstructive pulmonary disease) ([place])   . Hypertension   . Poor dentition 09/27/2012  . Right spontaneous pneumothorax 08/07/2016  . Tobacco abuse 03/24/2012   Overview:  1 PPD x age 68.  Prior h/o smokeless tobacco but quit     Significant Hospital Events:  07/29/2020 minutes,  hospital   Consults:  07/14/2020 neuro  Procedures:  07/23/2021 intubated  Significant Diagnostic Tests:  07/14/2020 CT of the head questionable area of hemorrhagic stroke  Micro Data:  2/23 2023 blood culture x2 07/24/2021 sputum culture 07/24/2021 urine culture  Antimicrobials:  07/17/2020 vancomycin 07/23/2020 cefepime 07/29/2020 Flagyl  Interim History / Subjective:  Admitted with altered mental status  Objective   Blood pressure (!) 117/95, pulse (!) 101, temperature 99.9 F (37.7 C), resp. rate 16, height 5\' 7"  (1.702 m), SpO2 100 %.    Vent Mode: PRVC FiO2 (%):  [100 %] 100 % Set Rate:  [18 bmp] 18 bmp Vt Set:  [530 mL] 530 mL PEEP:  [5 cmH20] 5 cmH20 Plateau Pressure:  [12 cmH20] 12 cmH20  No intake or output data in the 24 hours ending 07/11/2020 1129 There were no vitals filed for this visit.  Examination: General: Middle-age male appears older than stated age HENT: Endotracheal tube is in place gastric tube in place with bile drain Lungs: Coarse rhonchi bilaterally Cardiovascular: Heart sounds are regular Abdomen: Positive bowel sounds Extremities: Warm without edema Neuro: Just received neuromuscular blockade GU: Foley with amber urine  Resolved Hospital Problem list     Assessment & Plan:  Acute encephalopathy with concern for stroke Presented to ED via EMS after being found unresponsive by family. LKW 2300 on 2/22. On arrival to ED, minimally responsive to painful stimuli. Ammonia 43. CT Head 2/23 demonstrating findings c/f acute vs. subacute L PCA territory infarct with petechial hemorrhage.  - F/u MRI for better characterization of CT abnormality -  Frequent neuro checks - Neuro consulted  Suspected sepsis with unclear source Febrile to 100.44F (rectal temp) with HR 115 and RR 26 on arrival to ED with AMS. WBC 15.6. Code Sepsis initiated, pending further workup. - Trend WBC, lactate, PCT - F/u culture data - Obtain echo - Empiric broad spectrum  antibiotic coverage (Cefepime, Vanc, Flagyl) - No indication for pressors at present  Acute-on-chronic respiratory failure in the setting of COPD Known COPD with history of VATS/bleb resection (01/2018). Presented via EMS on NRB, sats 90s, unable to protect airway. Intubated in the ED. - Full vent support - Wean FiO2 for sat > 90% - VAP bundle - Daily SBT/WUA when clinically appropriate - Intermittent CXR  Acute kidney injury Cr 2.7 noted on arrival to ED. Baseline 0.8-1 as of 2019. - Volume resuscitate - Trend BMP - Monitor I&Os/UOP - Avoid nephrotoxic agents  History of polysubstance abuse (cocaine, marijuana, tobacco) Per family report, patient utilized cocaine and marijuana shortly before LKW 2300 2/22. - F/u UDS  Best practice (evaluated daily)  Diet: NPO Pain/Anxiety/Delirium protocol (if indicated): Sedation is VAP protocol (if indicated): In place DVT prophylaxis: Compression hose GI prophylaxis: PPI Glucose control: Scale insulin Mobility: Bedrest Disposition: Admit to intensive care unit  Goals of Care:  Last date of multidisciplinary goals of care discussion: Family and staff present:  Summary of discussion:  Follow up goals of care discussion due:  Code Status: full  Labs   CBC: Recent Labs  Lab 07/18/2020 0937 07/25/2020 1012  WBC 15.6*  --   NEUTROABS 12.6*  --   HGB 17.0 18.4*  HCT 51.5 54.0*  MCV 92.8  --   PLT 365  --     Basic Metabolic Panel: Recent Labs  Lab 07/23/2020 1012  NA 140  K 5.3*  CL 106  GLUCOSE 140*  BUN 28*  CREATININE 2.70*   GFR: CrCl cannot be calculated (Unknown ideal weight.). Recent Labs  Lab 07/16/2020 0937 07/16/2020 0938  WBC 15.6*  --   LATICACIDVEN  --  3.0*    Liver Function Tests: No results for input(s): AST, ALT, ALKPHOS, BILITOT, PROT, ALBUMIN in the last 168 hours. No results for input(s): LIPASE, AMYLASE in the last 168 hours. Recent Labs  Lab 07/19/2020 0937  AMMONIA 43*    ABG    Component  Value Date/Time   PHART 7.371 02/28/2018 0550   PCO2ART 41.3 02/28/2018 0550   PO2ART 107 02/28/2018 0550   HCO3 23.4 02/28/2018 0550   TCO2 22 07/21/2020 1012   ACIDBASEDEF 1.1 02/28/2018 0550   O2SAT 98.1 02/28/2018 0550     Coagulation Profile: Recent Labs  Lab 07/14/2020 0937  INR 1.1    Cardiac Enzymes: No results for input(s): CKTOTAL, CKMB, CKMBINDEX, TROPONINI in the last 168 hours.  HbA1C: No results found for: HGBA1C  CBG: Recent Labs  Lab 07/16/2020 0940  GLUCAP 149*    Review of Systems:   Na   Past Medical History:  He,  has a past medical history of Chronic back pain, Cocaine abuse ([place]) (08/09/2016), COPD (chronic obstructive pulmonary disease) ([place]), Hypertension, Poor dentition (09/27/2012), Right spontaneous pneumothorax (08/07/2016), and Tobacco abuse (03/24/2012).   Surgical History:   Past Surgical History:  Procedure Laterality Date  . NO PAST SURGERIES    . VIDEO ASSISTED THORACOSCOPY (VATS)/WEDGE RESECTION Right 02/27/2018   Procedure: VIDEO ASSISTED THORACOSCOPY (VATS)/WEDGE RESECTION;  Surgeon: Melrose Nakayama, MD;  Location: [facility];  Service: Thoracic;  Laterality: Right;  Social History:   reports that he has been smoking cigarettes. He started smoking about 47 years ago. He has a 67.50 pack-year smoking history. He has never used smokeless tobacco. He reports that he does not drink alcohol.   Family History:  His family history includes Arthritis in his mother; Cancer in his father; Diabetes in his mother; Hyperlipidemia in his mother; Hypertension in his mother; Kidney disease in his mother; Stroke in his mother.   Allergies Allergies  Allergen Reactions  . Tylenol [Acetaminophen] Nausea And Vomiting     Home Medications  Prior to Admission medications   Medication Sig Start Date End Date Taking? Authorizing Provider  albuterol (PROAIR HFA) 108 (90 Base) MCG/ACT inhaler Inhale 2 puffs into the lungs every 6 (six) hours as  needed for wheezing or shortness of breath.    [provider]  amLODipine (NORVASC) 5 MG tablet Take 1 tablet (5 mg total) by mouth daily. 03/04/18   Bonnielee Haff, MD  aspirin EC 81 MG tablet Take 162 mg by mouth daily as needed (pain).    [provider]  gabapentin (NEURONTIN) 400 MG capsule Take 1 capsule (400 mg total) by mouth 3 (three) times daily. 03/29/18   Melrose Nakayama, MD  ipratropium-albuterol (DUONEB) 0.5-2.5 (3) MG/3ML SOLN Take 3 mLs by nebulization every 6 (six) hours as needed (wheezing, shortness of breath). 03/04/18   Bonnielee Haff, MD  nicotine (NICODERM CQ - DOSED IN MG/24 HOURS) 21 mg/24hr patch Place 1 patch (21 mg total) onto the skin daily. Patient taking differently: Place 21 mg onto the skin daily as needed (when in hospital stays).  08/14/16   Elgie Collard, PA-C  oxyCODONE (OXY IR/ROXICODONE) 5 MG immediate release tablet Take 1 tablet (5 mg total) by mouth every 6 (six) hours as needed for severe pain. Take 5 mg by mouth every 4-6 hours PRN severe pain 03/29/18   Melrose Nakayama, MD     Critical care time: 78 min      Richardson Landry Kimyah Frein ACNP Acute Care Nurse Practitioner [facility] Please consult Amion 07/10/2020, 11:51 AM

## 2020-07-24 NOTE — Progress Notes (Signed)
I received a call from Vicenta Aly, FNP from [facility] who was the patient's primary care many years ago, who has not seen in a while-she called back because she received a copy of the consultative note due to the EMR automatically routing the consultations to the listed PCP. 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 Given the history of malignancy, it is furthermore important to look at his brain.  Given his deranged renal function, I would hold off on the contrast for now and continue with the MRI brain without contrast and further evaluate if there are any abnormalities. I will make sure that the primary team is made aware of this as well.   -- Amie Portland, MD Neurologist [facility] Pager: [phone]

## 2020-07-24 NOTE — ED Provider Notes (Signed)
  Physical Exam  BP (!) 181/107   Pulse (!) 109   Temp 100.1 F (37.8 C)   Resp 20   SpO2 100%   Physical Exam  ED Course/Procedures   Clinical Course as of 07/27/2020 1054  Wed Jul 24, 1069  [id number] 60 year old male brought in by EMS with altered mental status as above. On exam, minimally responsive with painful stimuli,  Found to have a rectal temp of 100.8, tachycardic, code sepsis initiated with antibiotics covering unknown source/broad spectrum.  [LM]    Clinical Course User Index [LM] Roque Lias    Procedure Name: Intubation Date/Time: 07/28/2020 10:54 AM Performed by: Lacretia Leigh, MD Pre-anesthesia Checklist: Patient identified Oxygen Delivery Method: Ambu bag Preoxygenation: Pre-oxygenation with 100% oxygen Induction Type: Rapid sequence Ventilation: Mask ventilation without difficulty Laryngoscope Size: Glidescope and 1 Grade View: Grade I Tube size: 7.5 mm Number of attempts: 1 Airway Equipment and Method: Video-laryngoscopy Placement Confirmation: ETT inserted through vocal cords under direct vision,  Positive ETCO2,  CO2 detector and Breath sounds checked- equal and bilateral Secured at: 24 cm Difficulty Due To: Difficulty was unanticipated     CRITICAL CARE Performed by: Leota Jacobsen Total critical care time: 60 minutes Critical care time was exclusive of separately billable procedures and treating other patients. Critical care was necessary to treat or prevent imminent or life-threatening deterioration. Critical care was time spent personally by me on the following activities: development of treatment plan with patient and/or surrogate as well as nursing, discussions with consultants, evaluation of patient's response to treatment, examination of patient, obtaining history from patient or surrogate, ordering and performing treatments and interventions, ordering and review of laboratory studies, ordering and review of radiographic studies, pulse  oximetry and re-evaluation of patient's condition.   MDM        Lacretia Leigh, MD 07/10/2020 1055

## 2020-07-24 NOTE — Sepsis Progress Note (Signed)
eLink is monitoring this Code Sepsis. °

## 2020-07-24 NOTE — Progress Notes (Addendum)
UDS +ve for cocaine and THC  MRI brain and MRA head without contrast completed. Watershed pattern of infarctions extending front to back affecting both hemispheres at the watershed region.  3 cm acute infarction of the left parieto-occipital junction-this correlates with the visible hypodensity on the CT with mild petechial blood products in the left right occipital infarction region. No evidence of frank hematoma or mass-effect. Findings most likely secondary to a global hypoperfusion event.  Also could be secondary to vasospasm from cocaine use. Septic emboli still remain in the differentials.  MR angiography with no large or medium vessel occlusion in the head.  There is atherosclerotic irregularity of the nondominant right vertebral artery and the basilar artery but no significant stenosis.  Fetal origin left PCA from the anterior circulation.  He will need complete stroke work-up at this time Updated recommendations: -Hold aspirin for now due to the concern for septic emboli. -Telemetry -Frequent rechecks -2D echo -A1c and lipid panel -PT OT  Stroke team will follow with you   -- Amie Portland, MD Neurologist [facility] Pager: [phone]

## 2020-07-24 NOTE — ED Provider Notes (Signed)
[place] EMERGENCY DEPARTMENT Provider Note   CSN: [id number] Arrival date & time: 07/19/2020  0930     History Chief Complaint  Patient presents with  . Altered Mental Status    DERRILL BAGNELL is a 60 y.o. male.  60 yo male with history of HTN, COPD, cocaine abuse, brought in by EMS for altered mental status. EMS was called by family today who found patient on the floor at home. Family last spoke with the patient around 11PM last night. EMS gave narcan with no change in mental status, CBG 130s, O2 sat low 90s, improved with NP airway and NRB. Patient was found in vomit, pants dry/underpants were wet/wearing work boots.         Past Medical History:  Diagnosis Date  . Chronic back pain   . Cocaine abuse ([place]) 08/09/2016   Patient caught using cocaine in his hospital room while an inpatient for spontaneous pneumothorax  . COPD (chronic obstructive pulmonary disease) ([place])   . Hypertension   . Poor dentition 09/27/2012  . Right spontaneous pneumothorax 08/07/2016  . Tobacco abuse 03/24/2012   Overview:  1 PPD x age 25.  Prior h/o smokeless tobacco but quit    Patient Active Problem List   Diagnosis Date Noted  . AMS (altered mental status) 07/23/2020  . Bleb, lung ([place]) 02/27/2018  . Pneumothorax 02/26/2018  . Hypertensive urgency 02/26/2018  . Pneumothorax on right 02/26/2018  . Cocaine abuse ([place]) 08/09/2016  . Right spontaneous pneumothorax 08/07/2016  . Bronchitis 08/07/2016  . Nicotine dependence, cigarettes, uncomplicated 28/36/6294  . Radiculopathy of lumbosacral region 09/12/2013  . Chronic bilateral low back pain without sciatica 09/12/2013  . COPD (chronic obstructive pulmonary disease) ([place]) 01/27/2013  . Hyperglycemia 01/24/2013  . Essential hypertension 11/21/2012  . Hyperlipidemia with target LDL less than 100 11/21/2012  . Poor dentition 09/27/2012  . Tobacco abuse 03/24/2012    Past Surgical History:  Procedure Laterality Date  .  NO PAST SURGERIES    . VIDEO ASSISTED THORACOSCOPY (VATS)/WEDGE RESECTION Right 02/27/2018   Procedure: VIDEO ASSISTED THORACOSCOPY (VATS)/WEDGE RESECTION;  Surgeon: Melrose Nakayama, MD;  Location[facility] OR;  Service: Thoracic;  Laterality: Right;       Family History  Problem Relation Age of Onset  . Arthritis Mother   . Diabetes Mother   . Hyperlipidemia Mother   . Hypertension Mother   . Stroke Mother   . Kidney disease Mother   . Cancer Father     Social History   Tobacco Use  . Smoking status: Current Every Day Smoker    Packs/day: 1.50    Years: 45.00    Pack years: 67.50    Types: Cigarettes    Start date: 67  . Smokeless tobacco: Never Used  . Tobacco comment: smoked 1 1/2 packs for 10 years and 1 pack per day for 25 years - 40 pack year smoker   Substance Use Topics  . Alcohol use: No    Alcohol/week: 0.0 standard drinks    Home Medications Prior to Admission medications   Medication Sig Start Date End Date Taking? Authorizing Provider  albuterol (VENTOLIN HFA) 108 (90 Base) MCG/ACT inhaler Inhale 2 puffs into the lungs every 6 (six) hours as needed for wheezing or shortness of breath.   Yes [provider]  aspirin EC 81 MG tablet Take 162 mg by mouth daily as needed (pain).   Yes [provider]  atorvastatin (LIPITOR) 20 MG tablet Take 1  tablet by mouth daily. 12/31/19  Yes [provider]  oxyCODONE (OXY IR/ROXICODONE) 5 MG immediate release tablet Take 1 tablet (5 mg total) by mouth every 6 (six) hours as needed for severe pain. Take 5 mg by mouth every 4-6 hours PRN severe pain 03/29/18  Yes Melrose Nakayama, MD  ipratropium-albuterol (DUONEB) 0.5-2.5 (3) MG/3ML SOLN Take 3 mLs by nebulization every 6 (six) hours as needed (wheezing, shortness of breath). 03/04/18   Bonnielee Haff, MD    Allergies    Tylenol [acetaminophen]  Review of Systems   Review of Systems  Unable to perform ROS: Mental status change     Physical Exam Updated Vital Signs BP (!) 124/93   Pulse 81   Temp 99.9 F (37.7 C)   Resp 17   Ht 5\' 7"  (1.702 m)   Wt 77.7 kg   SpO2 100%   BMI 26.83 kg/m   Physical Exam Vitals and nursing note reviewed.  Constitutional:      Appearance: He is normal weight. He is not diaphoretic.  HENT:     Head: Normocephalic and atraumatic.     Nose:     Comments: Green emesis noted in left nare Eyes:     Pupils: Pupils are equal, round, and reactive to light.  Cardiovascular:     Rate and Rhythm: Regular rhythm. Tachycardia present.     Heart sounds: Normal heart sounds.  Abdominal:     Palpations: Abdomen is soft.     Tenderness: There is no abdominal tenderness.  Musculoskeletal:        General: No deformity.     Right lower leg: No edema.     Left lower leg: No edema.  Skin:    General: Skin is warm and dry.     Findings: No erythema or rash.  Neurological:     GCS: GCS eye subscore is 1. GCS verbal subscore is 1. GCS motor subscore is 3.     Comments: Minimally responsive to painful stimuli      ED Results / Procedures / Treatments   Labs (all labs ordered are listed, but only abnormal results are displayed) Labs Reviewed  CBC WITH DIFFERENTIAL/PLATELET - Abnormal; Notable for the following components:      Result Value   WBC 15.6 (*)    Neutro Abs 12.6 (*)    Monocytes Absolute 1.5 (*)    Abs Immature Granulocytes 0.10 (*)    All other components within normal limits  COMPREHENSIVE METABOLIC PANEL - Abnormal; Notable for the following components:   Potassium 5.4 (*)    Glucose, Bld 141 (*)    BUN 23 (*)    Creatinine, Ser 2.76 (*)    AST 50 (*)    GFR, Estimated 26 (*)    Anion gap 16 (*)    All other components within normal limits  URINALYSIS, ROUTINE W REFLEX MICROSCOPIC - Abnormal; Notable for the following components:   Color, Urine AMBER (*)    APPearance CLOUDY (*)    Hgb urine dipstick SMALL (*)    Protein, ur 30 (*)    Bacteria, UA RARE (*)     All other components within normal limits  AMMONIA - Abnormal; Notable for the following components:   Ammonia 43 (*)    All other components within normal limits  LACTIC ACID, PLASMA - Abnormal; Notable for the following components:   Lactic Acid, Venous 3.0 (*)    All other components within normal limits  RAPID  URINE DRUG SCREEN, HOSP PERFORMED - Abnormal; Notable for the following components:   Cocaine POSITIVE (*)    Tetrahydrocannabinol POSITIVE (*)    All other components within normal limits  CBG MONITORING, ED - Abnormal; Notable for the following components:   Glucose-Capillary 149 (*)    All other components within normal limits  I-STAT CHEM 8, ED - Abnormal; Notable for the following components:   Potassium 5.3 (*)    BUN 28 (*)    Creatinine, Ser 2.70 (*)    Glucose, Bld 140 (*)    Calcium, Ion 0.97 (*)    Hemoglobin 18.4 (*)    HCT 54.0 (*)    All other components within normal limits  I-STAT ARTERIAL BLOOD GAS, ED - Abnormal; Notable for the following components:   pH, Arterial 7.241 (*)    pCO2 arterial 50.0 (*)    pO2, Arterial 418 (*)    Acid-base deficit 6.0 (*)    Calcium, Ion 1.05 (*)    All other components within normal limits  TROPONIN I (HIGH SENSITIVITY) - Abnormal; Notable for the following components:   Troponin I (High Sensitivity) 1,605 (*)    All other components within normal limits  RESP PANEL BY RT-PCR (FLU A&B, COVID) ARPGX2  URINE CULTURE  CULTURE, BLOOD (ROUTINE X 2)  CULTURE, BLOOD (ROUTINE X 2)  CULTURE, BLOOD (ROUTINE X 2)  CULTURE, BLOOD (ROUTINE X 2)  URINE CULTURE  CULTURE, RESPIRATORY W GRAM STAIN  PROTIME-INR  ETHANOL  CK  LACTIC ACID, PLASMA  HIV ANTIBODY (ROUTINE TESTING W REFLEX)  COMPREHENSIVE METABOLIC PANEL  MAGNESIUM  PHOSPHORUS  PROCALCITONIN  CORTISOL  CBC  PROTIME-INR  APTT  STREP PNEUMONIAE URINARY ANTIGEN  BLOOD GAS, ARTERIAL  AMYLASE  LIPASE, BLOOD  LACTIC ACID, PLASMA  LACTIC ACID, PLASMA   TROPONIN I (HIGH SENSITIVITY)  TROPONIN I (HIGH SENSITIVITY)    EKG EKG Interpretation  Date/Time:  Wednesday July 24 2020 09:35:16 EST Ventricular Rate:  112 PR Interval:    QRS Duration: 92 QT Interval:  325 QTC Calculation: 444 R Axis:   148 Text Interpretation: Sinus tachycardia Right axis deviation Abnormal R-wave progression, late transition Confirmed by Lacretia Leigh ([id number]) on 07/22/2020 9:58:40 AM   Radiology CT Head Wo Contrast  Result Date: 07/09/2020 CLINICAL DATA:  60 year old male with altered mental status, poorly responsive. EXAM: CT HEAD WITHOUT CONTRAST TECHNIQUE: Contiguous axial images were obtained from the base of the skull through the vertex without intravenous contrast. COMPARISON:  None. FINDINGS: Brain: Patchy bilateral fairly symmetric white matter hypodensity in both hemispheres. Confluent asymmetric mixed hypo and hyperdensity in the superior left occipital pole, series 3, image 14 and coronal image 63. No similar cortical hypodensity elsewhere. Deep gray nuclei and posterior fossa gray-white differentiation remains within normal limits. No associated intracranial mass effect. No midline shift. No ventriculomegaly. No extra-axial hemorrhage identified. Vascular: Mild Calcified atherosclerosis at the skull base. Suggestion of fetal type left PCA origin. Questionable hyperdense left PCA P2 are P3 segment on series 3, image 15. No other suspicious intracranial vascular hyperdensity. Skull: Negative. Sinuses/Orbits: Right nasal airway in place. Small volume retained secretions in the nasopharynx. Mild mucosal thickening and bubbly opacity in the left ethmoid and maxillary sinuses. Tympanic cavities and mastoids are clear. Other: Visualized orbits and scalp soft tissues are within normal limits. IMPRESSION: 1. Abnormal mixed hypo- and hyperdensity in the superior left occipital lobe encompassing roughly 3 cm. [name] this is an acute or subacute Left PCA territory  infarct with  petechial hemorrhage (questionable hyperdense left PCA) - but recommend Head MRI (without contrast may suffice) to confirm. 2. No significant intracranial mass effect. Superimposed age advanced bilateral white matter changes, most commonly due to chronic small vessel disease. 3. Right nasal airway in place. Mild left paranasal sinus inflammation. Electronically Signed   By: Genevie Ann M.D.   On: 07/18/2020 10:07   DG Chest Portable 1 View  Result Date: 07/20/2020 CLINICAL DATA:  Intubation EXAM: PORTABLE CHEST 1 VIEW COMPARISON:  07/27/2020 at 0948 hours FINDINGS: Interval placement of endotracheal tube distal tip terminating approximately 5.7 cm above the carina. Interval placement of enteric tube with distal tip extending beyond the inferior margin of the film. Stable cardiomediastinal contours. Mild pulmonary vascular congestion. Hazy perihilar and bibasilar interstitial opacities, right slightly worse than left. No pleural effusion. No pneumothorax. Postsurgical changes and scarring within the right upper lobe. IMPRESSION: 1. Satisfactory endotracheal tube and enteric tube positioning. 2. Hazy perihilar and bibasilar interstitial opacities, right slightly worse than left, could reflect edema versus infection. Electronically Signed   By: Davina Poke D.O.   On: 07/23/2020 11:35   DG Chest Port 1 View  Result Date: 07/12/2020 CLINICAL DATA:  Altered level of consciousness. EXAM: PORTABLE CHEST 1 VIEW COMPARISON:  June 21, 2018. FINDINGS: The heart size and mediastinal contours are within normal limits. No pneumothorax or pleural effusion is noted. Stable postsurgical changes and scarring are noted in right upper lobe. No acute abnormality is noted. The visualized skeletal structures are unremarkable. IMPRESSION: No active disease. Electronically Signed   By: Marijo Conception M.D.   On: 07/22/2020 10:14    Procedures .Critical Care Performed by: Tacy Learn, PA-C Authorized by:  Tacy Learn, PA-C   Critical care provider statement:    Critical care time (minutes):  45   Critical care was time spent personally by me on the following activities:  Discussions with consultants, evaluation of patient's response to treatment, examination of patient, ordering and performing treatments and interventions, ordering and review of laboratory studies, ordering and review of radiographic studies, pulse oximetry, re-evaluation of patient's condition, obtaining history from patient or surrogate and review of old charts     Medications Ordered in ED Medications  0.9 %  sodium chloride infusion ( Intravenous New Bag/Given 07/11/2020 1117)  vancomycin (VANCOREADY) IVPB 2000 mg/400 mL (2,000 mg Intravenous New Bag/Given 07/21/2020 1117)  docusate sodium (COLACE) capsule 100 mg (has no administration in time range)  polyethylene glycol (MIRALAX / GLYCOLAX) packet 17 g (has no administration in time range)  pantoprazole (PROTONIX) injection 40 mg (has no administration in time range)  0.9 %  sodium chloride infusion (has no administration in time range)  fentaNYL (SUBLIMAZE) injection 100 mcg (has no administration in time range)  fentaNYL (SUBLIMAZE) injection 100 mcg (has no administration in time range)  propofol (DIPRIVAN) 1000 MG/100ML infusion (has no administration in time range)  sodium chloride 0.9 % bolus 1,000 mL (0 mLs Intravenous Stopped 07/26/2020 1130)    And  sodium chloride 0.9 % bolus 1,000 mL (0 mLs Intravenous Stopped 07/06/2020 1130)    And  sodium chloride 0.9 % bolus 500 mL (0 mLs Intravenous Stopped 07/08/2020 1117)  ceFEPIme (MAXIPIME) 2 g in sodium chloride 0.9 % 100 mL IVPB (0 g Intravenous Stopped 07/17/2020 1130)  metroNIDAZOLE (FLAGYL) IVPB 500 mg (0 mg Intravenous Stopped 07/13/2020 1130)  acetaminophen (TYLENOL) suppository 650 mg (650 mg Rectal Given 07/26/2020 1025)  etomidate (AMIDATE) injection (20 mg Intravenous  Given 07/20/2020 1043)  rocuronium (ZEMURON) injection (80  mg Intravenous Given 07/19/2020 1044)    ED Course  I have reviewed the triage vital signs and the nursing notes.  Pertinent labs & imaging results that were available during my care of the patient were reviewed by me and considered in my medical decision making (see chart for details).  Clinical Course as of 07/04/2020 1226  Wed Jul 24, 2670  [id number] 60 year old male brought in by EMS with altered mental status as above. On exam, minimally responsive with painful stimuli.  Found to have a rectal temp of 100.8, tachycardic, code sepsis initiated with antibiotics covering unknown source/broad spectrum. CT head for ALOC. [LM]  1101 CT head with acute vs subacute left PCA infarct.  Discussed with Dr. Rory Percy, neurohospitalist, recommends CTA head and neck, consider septic emboli. Due to patient's Cr 2.7 on istat, will order MRA brain for further evaluation.   Patient's condition is not improving with O2 and fluids, patient was intubated by Dr. Zenia Resides, Er attending. Consult to critical care who will consult for admission.  CXR unremarkable.  [LM]  2458 Additional information from family, patient reportedly did cocaine and marijuana last night and complained of chest pain. Troponin pending.  [LM]  0998 Labs reviewed, CBC with leukocytosis with WBC 15.6 with increased neutrophils. Lactic acid 3, sepsis orders for fluids and abx previously ordered. Will plan to trend lactic.  [LM]    Clinical Course User Index [LM] Roque Lias   MDM Rules/Calculators/A&P                          Final Clinical Impression(s) / ED Diagnoses Final diagnoses:  Sepsis, due to unspecified organism, unspecified whether acute organ dysfunction present [facility])  Cerebrovascular accident (CVA), unspecified mechanism ([place])  AKI (acute kidney injury) [facility])    Rx / DC Orders ED Discharge Orders    None       Roque Lias 07/20/2020 1226    Lacretia Leigh, MD 07/29/20 [phone]

## 2020-07-24 NOTE — Consult Note (Addendum)
Neurology Consultation  Reason for Consult: Unresponsiveness, concern for stroke on head CT Referring Physician-Dr. Zenia Resides, ED provider  CC: Unresponsiveness, concern for stroke on head CT  History is obtained from: Chart review  HPI: Benjamin Burke is a 60 y.o. male past medical history of cocaine abuse, chronic back pain, hypertension, tobacco abuse, brought into the emergency room with last known well somewhere around 11 PM on 07/23/2020 witnessed by family, and this morning then noted to be found down by family.  EMS was called-on their evaluation he was obtunded.  Given his history of polysubstance abuse, he was given 2 mg of Narcan without improvement.  He came on a nonrebreather with oxygen saturations are 98%, heart rate 115, respiratory rate 26, blood pressure 125/79. Code sepsis was initiated because he also spiked a fever. A head CT was performed that showed question of a hypodensity with admixed hyperdensity in the superior left occipital lobe concerning for a acute to subacute left PCA territory infarct with petechial hemorrhage and also a questionable hyperdense left PCA but MRI was recommended for further confirmation  Neurology was consulted for the unresponsiveness and concerns for stroke.   LKW: 11 PM, July 24, 2020 tpa given?: no, outside the window Premorbid modified Rankin scale (mRS): Unable to ascertain due to patient's mentation-attempts to reach family underway.  ROS: Unable to ascertain due to altered mental status.  Past Medical History:  Diagnosis Date  . Chronic back pain   . Cocaine abuse ([place]) 08/09/2016   Patient caught using cocaine in his hospital room while an inpatient for spontaneous pneumothorax  . COPD (chronic obstructive pulmonary disease) ([place])   . Hypertension   . Poor dentition 09/27/2012  . Right spontaneous pneumothorax 08/07/2016  . Tobacco abuse 03/24/2012   Overview:  1 PPD x age 49.  Prior h/o smokeless tobacco but quit   Family  History  Problem Relation Age of Onset  . Arthritis Mother   . Diabetes Mother   . Hyperlipidemia Mother   . Hypertension Mother   . Stroke Mother   . Kidney disease Mother   . Cancer Father    Social History:   reports that he has been smoking cigarettes. He started smoking about 47 years ago. He has a 67.50 pack-year smoking history. He has never used smokeless tobacco. He reports that he does not drink alcohol. No history on file for drug use.  Medications  Current Facility-Administered Medications:  .  0.9 %  sodium chloride infusion, , Intravenous, Continuous, Percell Miller Hewitt Shorts, PA-C .  etomidate (AMIDATE) injection, , Intravenous, PRN, Lacretia Leigh, MD, 20 mg at 07/13/2020 1043 .  metroNIDAZOLE (FLAGYL) IVPB 500 mg, 500 mg, Intravenous, Once, Tacy Learn, PA-C, Last Rate: 100 mL/hr at 07/19/2020 1025, 500 mg at 07/02/2020 1025 .  rocuronium (ZEMURON) injection, , Intravenous, PRN, Lacretia Leigh, MD, 80 mg at 07/09/2020 1044 .  vancomycin (VANCOREADY) IVPB 2000 mg/400 mL, 2,000 mg, Intravenous, Once, Tacy Learn, PA-C  Current Outpatient Medications:  .  albuterol (PROAIR HFA) 108 (90 Base) MCG/ACT inhaler, Inhale 2 puffs into the lungs every 6 (six) hours as needed for wheezing or shortness of breath., Disp: , Rfl:  .  amLODipine (NORVASC) 5 MG tablet, Take 1 tablet (5 mg total) by mouth daily., Disp: 30 tablet, Rfl: 0 .  aspirin EC 81 MG tablet, Take 162 mg by mouth daily as needed (pain)., Disp: , Rfl:  .  gabapentin (NEURONTIN) 400 MG capsule, Take 1 capsule (400 mg  total) by mouth 3 (three) times daily., Disp: 90 capsule, Rfl: 3 .  ipratropium-albuterol (DUONEB) 0.5-2.5 (3) MG/3ML SOLN, Take 3 mLs by nebulization every 6 (six) hours as needed (wheezing, shortness of breath)., Disp: 360 mL, Rfl: 0 .  nicotine (NICODERM CQ - DOSED IN MG/24 HOURS) 21 mg/24hr patch, Place 1 patch (21 mg total) onto the skin daily. (Patient taking differently: Place 21 mg onto the skin daily as  needed (when in hospital stays). ), Disp: 28 patch, Rfl: 0 .  oxyCODONE (OXY IR/ROXICODONE) 5 MG immediate release tablet, Take 1 tablet (5 mg total) by mouth every 6 (six) hours as needed for severe pain. Take 5 mg by mouth every 4-6 hours PRN severe pain, Disp: 20 tablet, Rfl: 0  Exam: Current vital signs: BP (!) 181/107   Pulse (!) 109   Temp 100.1 F (37.8 C)   Resp 20   Ht 5\' 7"  (1.702 m)   SpO2 100%   BMI 27.57 kg/m  Vital signs in last 24 hours: Temp:  [99.3 F (37.4 C)-100.8 F (38.2 C)] 100.1 F (37.8 C) (02/23 1045) Pulse Rate:  [102-109] 109 (02/23 1045) Resp:  [19-25] 20 (02/23 1045) BP: (123-181)/(90-107) 181/107 (02/23 1045) SpO2:  [97 %-100 %] 100 % (02/23 1045) General: drowsy, only opens eyes to very deep sternal rub. HEENT: Normocephalic atraumatic Lungs: Clear Cardiovascular: Regular rate rhythm Abdomen soft nondistended nontender Extremities warm well perfused with intact pulses Neurological exam Drowsy, opens eyes to very deep sternal rub. Does not follow any commands Nonverbal Cranial nerves: Pupils appear equal round reactive light, has roving eye movements, does attempt to resist eye opening and does not appear to have any asymmetric facial strength. Motor exam: No spontaneous movement or movement to noxious stimulation in bilateral upper extremities.  To noxious stimulation on his inner thighs, he has mild withdrawal in both lower extremities. Sensory exam: As above Coordination: Cannot assess NIHSS 1a Level of Conscious.: 1 1b LOC Questions: 2 1c LOC Commands:2  2 Best Gaze: 0 3 Visual: 0 4 Facial Palsy: 0 5a Motor Arm - left: 4 5b Motor Arm - Right: 4 6a Motor Leg - Left: 3 6b Motor Leg - Right: 3 7 Limb Ataxia: 0 8 Sensory: 0 9 Best Language: 3 10 Dysarthria: 2 11 Extinct. and Inatten.: 0 TOTAL: 24  Labs I have reviewed labs in epic and the results pertinent to this consultation are:  CBC    Component Value Date/Time   WBC 15.6  (H) 07/16/2020 0937   RBC 5.55 07/05/2020 0937   HGB 18.4 (H) 07/07/2020 1012   HCT 54.0 (H) 07/05/2020 1012   PLT 365 07/13/2020 0937   MCV 92.8 07/27/2020 0937   MCH 30.6 07/12/2020 0937   MCHC 33.0 07/25/2020 0937   RDW 14.9 07/05/2020 0937   LYMPHSABS 1.4 07/25/2020 0937   MONOABS 1.5 (H) 07/13/2020 0937   EOSABS 0.0 07/23/2020 0937   BASOSABS 0.0 07/20/2020 0937    CMP     Component Value Date/Time   NA 140 07/19/2020 1012   K 5.3 (H) 07/12/2020 1012   CL 106 07/04/2020 1012   CO2 29 03/03/2018 0322   GLUCOSE 140 (H) 07/26/2020 1012   BUN 28 (H) 07/12/2020 1012   CREATININE 2.70 (H) 07/02/2020 1012   CALCIUM 8.7 (L) 03/03/2018 0322   PROT 5.6 (L) 03/01/2018 0218   ALBUMIN 2.9 (L) 03/01/2018 0218   AST 15 03/01/2018 0218   ALT 13 03/01/2018 0218   ALKPHOS 61  03/01/2018 0218   BILITOT 0.6 03/01/2018 0218   GFRNONAA >60 03/03/2018 0322   GFRAA >60 03/03/2018 0322   Imaging I have reviewed the images obtained:  CT-scan of the brain-abnormal mixed hypo and hyper density in the superior left occipital lobe of about 3 cm-likely acute to subacute left PCA infarct with petechial hemorrhage with a questionable hyperdense left PCA but MRI recommended for further evaluation. No significant mass-effect.  Superimposed age-advanced bilateral white matter changes most commonly due to chronic small vessel disease. Right nasal airway in place with mild left paranasal sinus inflammation.  Assessment:  60 year old man with past medical history of cocaine abuse, chronic back pain, hypertension, tobacco abuse brought in after being found down with last known well of 11 PM on 07/23/2020, with concern for sepsis-like picture and clinical presentation-has leukocytosis, spiked a fever and was tachycardic. CT head with concern for a left occipital lobe acute to subacute PCA infarct with petechial hemorrhage and questionable dense left PCA. Stroke size does not begin after explaining the  mentation that he is exhibiting right now that she is much more encephalopathic than you would expect for anybody with any signs of a stroke. Differentials-sepsis versus posterior circulation occlusion like a basilar thrombus. Given history of polysubstance abuse, stroke on MRI, obtundation-differentials also include infective endocarditis, further sources of sepsis remain unrevealing.  Recommendations: Patient is being emergently intubated as he is unable to protect his airway. MRI brain without contrast and MRA head without contrast after intubation. Check chest x-ray, urinalysis Checks urinary toxicology screen Check blood cultures  Will follow.  -- Amie Portland, MD Neurologist [facility] Pager: [phone]   ADDENDUM My team was able to reach family-he lives with the mother, as per his mother and niece the patient went to work yesterday and came back around 7 PM in the evening, because complaining of some chest pain.  He used/snorted cocaine and smoked marijuana and went to bed around 11 PM-that is when he was found to be last well.  She went to check on him at 12 AM but he did not respond-he thought he was sleeping and did not bother him again till 8:30 AM when she went to check on him and he was Mabeline Caras is when EMS was called.   CRITICAL CARE ATTESTATION Performed by: Amie Portland, MD Total critical care time: 30 minutes Critical care time was exclusive of separately billable procedures and treating other patients and/or supervising APPs/Residents/Students Critical care was necessary to treat or prevent imminent or life-threatening deterioration due to strokelike symptoms, concern for toxic metabolic encephalopathy This patient is critically ill and at significant risk for neurological worsening and/or death and care requires constant monitoring. Critical care was time spent personally by me on the following activities: development of treatment plan with  patient and/or surrogate as well as nursing, discussions with consultants, evaluation of patient's response to treatment, examination of patient, obtaining history from patient or surrogate, ordering and performing treatments and interventions, ordering and review of laboratory studies, ordering and review of radiographic studies, pulse oximetry, re-evaluation of patient's condition, participation in multidisciplinary rounds and medical decision making of high complexity in the care of this patient.

## 2020-07-25 ENCOUNTER — Inpatient Hospital Stay (HOSPITAL_COMMUNITY): Payer: Medicare Other

## 2020-07-25 DIAGNOSIS — F17200 Nicotine dependence, unspecified, uncomplicated: Secondary | ICD-10-CM

## 2020-07-25 DIAGNOSIS — T17800S Unspecified foreign body in other parts of respiratory tract causing asphyxiation, sequela: Secondary | ICD-10-CM | POA: Diagnosis not present

## 2020-07-25 DIAGNOSIS — I6389 Other cerebral infarction: Secondary | ICD-10-CM

## 2020-07-25 DIAGNOSIS — D72829 Elevated white blood cell count, unspecified: Secondary | ICD-10-CM

## 2020-07-25 DIAGNOSIS — I6340 Cerebral infarction due to embolism of unspecified cerebral artery: Secondary | ICD-10-CM | POA: Diagnosis not present

## 2020-07-25 DIAGNOSIS — A419 Sepsis, unspecified organism: Secondary | ICD-10-CM | POA: Diagnosis not present

## 2020-07-25 DIAGNOSIS — T17800A Unspecified foreign body in other parts of respiratory tract causing asphyxiation, initial encounter: Secondary | ICD-10-CM

## 2020-07-25 DIAGNOSIS — R778 Other specified abnormalities of plasma proteins: Secondary | ICD-10-CM | POA: Diagnosis not present

## 2020-07-25 DIAGNOSIS — N179 Acute kidney failure, unspecified: Secondary | ICD-10-CM

## 2020-07-25 DIAGNOSIS — I639 Cerebral infarction, unspecified: Secondary | ICD-10-CM

## 2020-07-25 LAB — CBC
HCT: 48.6 % (ref 39.0–52.0)
Hemoglobin: 15.2 g/dL (ref 13.0–17.0)
MCH: 29.5 pg (ref 26.0–34.0)
MCHC: 31.3 g/dL (ref 30.0–36.0)
MCV: 94.2 fL (ref 80.0–100.0)
Platelets: 305 10*3/uL (ref 150–400)
RBC: 5.16 MIL/uL (ref 4.22–5.81)
RDW: 15.1 % (ref 11.5–15.5)
WBC: 16.7 10*3/uL — ABNORMAL HIGH (ref 4.0–10.5)
nRBC: 0 % (ref 0.0–0.2)

## 2020-07-25 LAB — PHOSPHORUS
Phosphorus: 2.2 mg/dL — ABNORMAL LOW (ref 2.5–4.6)
Phosphorus: 1.6 mg/dL — ABNORMAL LOW (ref 2.5–4.6)

## 2020-07-25 LAB — HEMOGLOBIN A1C
Hgb A1c MFr Bld: 6.1 % — ABNORMAL HIGH (ref 4.8–5.6)
Mean Plasma Glucose: 128.37 mg/dL

## 2020-07-25 LAB — BASIC METABOLIC PANEL
Anion gap: 9 (ref 5–15)
BUN: 24 mg/dL — ABNORMAL HIGH (ref 6–20)
CO2: 24 mmol/L (ref 22–32)
Calcium: 8.3 mg/dL — ABNORMAL LOW (ref 8.9–10.3)
Chloride: 108 mmol/L (ref 98–111)
Creatinine, Ser: 1.68 mg/dL — ABNORMAL HIGH (ref 0.61–1.24)
GFR, Estimated: 47 mL/min — ABNORMAL LOW (ref >60)
Glucose, Bld: 128 mg/dL — ABNORMAL HIGH (ref 70–99)
Potassium: 4.5 mmol/L (ref 3.5–5.1)
Sodium: 141 mmol/L (ref 135–145)

## 2020-07-25 LAB — GLUCOSE, CAPILLARY
Glucose-Capillary: 101 mg/dL — ABNORMAL HIGH (ref 70–99)
Glucose-Capillary: 102 mg/dL — ABNORMAL HIGH (ref 70–99)
Glucose-Capillary: 133 mg/dL — ABNORMAL HIGH (ref 70–99)
Glucose-Capillary: 85 mg/dL (ref 70–99)
Glucose-Capillary: 98 mg/dL (ref 70–99)

## 2020-07-25 LAB — MAGNESIUM
Magnesium: 2.7 mg/dL — ABNORMAL HIGH (ref 1.7–2.4)
Magnesium: 2.1 mg/dL (ref 1.7–2.4)

## 2020-07-25 LAB — POCT I-STAT 7, (LYTES, BLD GAS, ICA,H+H)
Acid-base deficit: 3 mmol/L — ABNORMAL HIGH (ref 0.0–2.0)
Bicarbonate: 23 mmol/L (ref 20.0–28.0)
Calcium, Ion: 1.21 mmol/L (ref 1.15–1.40)
HCT: 41 % (ref 39.0–52.0)
Hemoglobin: 13.9 g/dL (ref 13.0–17.0)
O2 Saturation: 94 %
Patient temperature: 37.2
Potassium: 4.2 mmol/L (ref 3.5–5.1)
Sodium: 140 mmol/L (ref 135–145)
TCO2: 24 mmol/L (ref 22–32)
pCO2 arterial: 42.1 mmHg (ref 32.0–48.0)
pH, Arterial: 7.346 — ABNORMAL LOW (ref 7.350–7.450)
pO2, Arterial: 76 mmHg — ABNORMAL LOW (ref 83.0–108.0)

## 2020-07-25 LAB — STREP PNEUMONIAE URINARY ANTIGEN: Strep Pneumo Urinary Antigen: NEGATIVE

## 2020-07-25 LAB — LIPID PANEL
Cholesterol: 116 mg/dL (ref 0–200)
HDL: 36 mg/dL — ABNORMAL LOW (ref >40)
LDL Cholesterol: 60 mg/dL (ref 0–99)
Total CHOL/HDL Ratio: 3.2 RATIO
Triglycerides: 99 mg/dL (ref <150)
VLDL: 20 mg/dL (ref 0–40)

## 2020-07-25 LAB — ECHOCARDIOGRAM COMPLETE
Area-P 1/2: 2.36 cm2
Height: 67 in
S' Lateral: 3.8 cm
Single Plane A4C EF: 31 %
Weight: 2807.78 oz

## 2020-07-25 LAB — TRIGLYCERIDES: Triglycerides: 96 mg/dL (ref <150)

## 2020-07-25 MED ORDER — FAMOTIDINE 40 MG/5ML PO SUSR
20.0000 mg | Freq: Every day | ORAL | Status: DC
  Administered 2020-07-26: 20 mg
  Filled 2020-07-25: qty 2.5

## 2020-07-25 MED ORDER — FENTANYL CITRATE (PF) 100 MCG/2ML IJ SOLN
50.0000 ug | INTRAMUSCULAR | Status: AC | PRN
  Administered 2020-07-26 – 2020-07-27 (×2): 50 ug via INTRAVENOUS
  Filled 2020-07-25 (×2): qty 2

## 2020-07-25 MED ORDER — DOCUSATE SODIUM 50 MG/5ML PO LIQD: 100.0000 mg | Freq: Two times a day (BID) | ORAL | Status: DC | PRN

## 2020-07-25 MED ORDER — ATORVASTATIN CALCIUM 40 MG PO TABS
80.0000 mg | ORAL_TABLET | Freq: Every day | ORAL | Status: DC
  Administered 2020-07-25: 80 mg via ORAL
  Filled 2020-07-25: qty 2

## 2020-07-25 MED ORDER — POLYETHYLENE GLYCOL 3350 17 G PO PACK: 17.0000 g | PACK | Freq: Every day | ORAL | Status: DC | PRN

## 2020-07-25 MED ORDER — ACETAMINOPHEN 160 MG/5ML PO SOLN
650.0000 mg | Freq: Four times a day (QID) | ORAL | Status: DC | PRN
  Administered 2020-07-26 – 2020-07-29 (×8): 650 mg
  Filled 2020-07-25 (×9): qty 20.3

## 2020-07-25 MED ORDER — FAMOTIDINE 20 MG PO TABS
20.0000 mg | ORAL_TABLET | Freq: Every day | ORAL | Status: DC
  Administered 2020-07-25: 20 mg via ORAL
  Filled 2020-07-25: qty 1

## 2020-07-25 MED ORDER — POTASSIUM PHOSPHATES 15 MMOLE/5ML IV SOLN
30.0000 mmol | Freq: Once | INTRAVENOUS | Status: DC
  Filled 2020-07-25: qty 10

## 2020-07-25 MED ORDER — POTASSIUM & SODIUM PHOSPHATES 280-160-250 MG PO PACK
1.0000 | PACK | Freq: Three times a day (TID) | ORAL | Status: DC
  Filled 2020-07-25: qty 1

## 2020-07-25 MED ORDER — SODIUM CHLORIDE 0.9 % IV SOLN: 2.0000 g | Freq: Two times a day (BID) | INTRAVENOUS | Status: DC

## 2020-07-25 MED ORDER — VITAL AF 1.2 CAL PO LIQD
1000.0000 mL | ORAL | Status: DC
  Administered 2020-07-25 – 2020-07-31 (×8): 1000 mL

## 2020-07-25 MED ORDER — HEPARIN SODIUM (PORCINE) 5000 UNIT/ML IJ SOLN
5000.0000 [IU] | Freq: Three times a day (TID) | INTRAMUSCULAR | Status: AC
  Administered 2020-07-25 – 2020-07-31 (×17): 5000 [IU] via SUBCUTANEOUS
  Filled 2020-07-25 (×18): qty 1

## 2020-07-25 MED ORDER — POTASSIUM & SODIUM PHOSPHATES 280-160-250 MG PO PACK
1.0000 | PACK | Freq: Three times a day (TID) | ORAL | Status: AC
  Administered 2020-07-25 – 2020-07-26 (×3): 1
  Filled 2020-07-25 (×3): qty 1

## 2020-07-25 MED ORDER — ACETAMINOPHEN 160 MG/5ML PO SOLN
650.0000 mg | Freq: Four times a day (QID) | ORAL | Status: DC | PRN
  Administered 2020-07-25: 650 mg via ORAL
  Filled 2020-07-25: qty 20.3

## 2020-07-25 MED ORDER — PERFLUTREN LIPID MICROSPHERE
1.0000 mL | INTRAVENOUS | Status: AC | PRN
  Administered 2020-07-25: 3 mL via INTRAVENOUS
  Filled 2020-07-25: qty 10

## 2020-07-25 MED ORDER — SODIUM CHLORIDE 0.9 % IV SOLN
1.0000 g | INTRAVENOUS | Status: AC
  Administered 2020-07-25 – 2020-07-28 (×4): 1 g via INTRAVENOUS
  Filled 2020-07-25: qty 1
  Filled 2020-07-25 (×3): qty 10

## 2020-07-25 MED ORDER — ATORVASTATIN CALCIUM 40 MG PO TABS: 80.0000 mg | ORAL_TABLET | Freq: Every day | ORAL | Status: DC

## 2020-07-25 NOTE — Progress Notes (Signed)
SLP Cancellation Note  Patient Details Name: Benjamin Burke MRN: [id number] DOB: [birth year]   Cancelled treatment:       Reason Eval/Treat Not Completed: Patient not medically ready. Order received for cognitive-linguistic evaluation. He's currently on the vent. Will f/u as able.   Osie Bond., M.A. [facility] Pager [phone] Office [phone]  07/25/2020, 7:23 AM

## 2020-07-25 NOTE — Progress Notes (Signed)
PT Cancellation Note  Patient Details Name: Benjamin Burke MRN: [id number] DOB: 03-Nov-1960   Cancelled Treatment:    Reason Eval/Treat Not Completed: Patient not medically ready (pt currently on vent with bedrest orders. Await increased activity order)   Benjamin Burke 07/25/2020, 6:52 AM  [name], PT Acute [facility] Services Pager: [phone] Office: [phone]

## 2020-07-25 NOTE — Progress Notes (Signed)
NAME:  Benjamin Burke, MRN:  [id number], DOB:  10-18-1960, LOS: 1 ADMISSION DATE:  07/09/2020, CONSULTATION DATE:  07/21/2020 REFERRING MD:EDP, CHIEF COMPLAINT:  Code sepsis    Brief History   60 year old with known history of cocaine abuse admitted with altered mental status fever.  Past Medical History   Past Medical History: No date: Chronic back pain 08/09/2016: Cocaine abuse ([facility])     Comment:  Patient caught using cocaine in his hospital room while               an inpatient for spontaneous pneumothorax No date: COPD (chronic obstructive pulmonary disease) ([place]) No date: Hypertension 09/27/2012: Poor dentition 08/07/2016: Right spontaneous pneumothorax 03/24/2012: Tobacco abuse     Comment:  Overview:  1 PPD x age 46.  Prior h/o smokeless tobacco               but quit  Significant Hospital Events   Arrived unresponsive on 2/23, he was subsequently intubated and OG placed  Consults:  CC[facility] Neurology  Procedures:  Intubation 2/23  Significant Diagnostic Tests:  CXR 2/23-COPD with low lung volumes and persistent bilateral interstitial prominence, pneumonitis not excluded  CT head 2/23-abnormal mixed hypo and hyper densities in the superior left occipital lobe encompassing roughly 3 cm.  [name] an acute or subacute left PCA territory infarct with petechial hemorrhage but recommend had an MRI.  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 3 cm acute infarction at the left parietooccipital junction was visible at CT mild petechial blood products in the left parietooccipital infarction region. No evidence of frank hematoma or mass effect. Findings most likely secondary to global hypoperfusion event.  Micro Data:  2/23 2023 blood culture x2 07/24/2021 sputum culture 07/24/2021 urine culture  Antimicrobials:  07/10/2020 vancomycin 07/02/2020 cefepime 07/29/2020 Flagyl  Interim history/subjective:   Overnight patient well.  He did become irritated when sedation was decreased and set up and tried to move around.  Sedation was then increased to keep him comfortable overnight.  No other acute events overnight.  Objective   Blood pressure (!) 126/91, pulse 81, temperature 98.96 F (37.2 C), resp. rate (!) 22, height 5\' 7"  (1.702 m), weight 79.6 kg, SpO2 100 %.    Vent Mode: PRVC FiO2 (%):  [40 %-100 %] 40 % Set Rate:  [18 bmp-22 bmp] 22 bmp Vt Set:  [530 mL] 530 mL PEEP:  [5 cmH20] 5 cmH20 Plateau Pressure:  [12 cmH20-15 cmH20] 15 cmH20   Intake/Output Summary (Last 24 hours) at 07/25/2020 0756 Last data filed at 07/25/2020 0756 Gross per 24 hour  Intake 1629.48 ml  Output 1090 ml  Net 539.48 ml   Filed Weights   07/19/2020 1115 07/25/20 0500  Weight: 77.7 kg 79.6 kg    Examination: General: 60 year old male, sedated, sleeping, moves lower extremities to pain HENT: ET tube in place with G-tube in place as well giving bilious drainage Lungs: Coarse rhonchi bilaterally Cardiovascular: Regular rate, no murmurs appreciated Abdomen: Positive bowel sounds Extremities: No gross abnormalities, no edema noted Neuro: Moves extremities to pain but not responsive otherwise, on reevaluation will open his eyes sometimes, pupils dilated but equal and reactive to light GU: Foley in place with dark urine  Resolved Hospital Problem list     Assessment & Plan:   [name] is a 60 y.o. with a pertinent PMH of hypertension, COPD, polysubstance abuse, who presented unresponsive and admitted for acute respiratory failure,  sepsis, acute CVA.   Acute encephalopathy likely secondary to CVA MRI showed watershed infarcts extending front to back affecting both hemispheres.  Neurology was consulted who feels most likely secondary to global hypoperfusion event.  Differential also includes vasospasm from cocaine use or septic emboli. -Neurology consulted, appreciate recommendations -Holding aspirin  at this time -Telemetry -Frequent neurochecks -2D echocardiogram has been ordered -Hemoglobin A1c shows 6.1 and lipid panel overall normal. -PT/OT eval and treat, speech consult  - Lipator 80 mg daily   Sepsis secondary to bilateral lower lobe pneumonia Febrile on arrival with a temperature of 100.8.  Tachycardic at 115, respiratory rate of 26.  WBC 15.6 on arrival.  Repeat white count this morning of 16.7.  Blood cultures and urine cultures pending at this time.  Negative MRSA PCR.  Started on Flagyl, cefepime, vancomycin in the emergency department.  -Continue to follow blood cultures and urine cultures -Currently on cefepime, Vanc, Flagy  - Narrow antibiotic to ceftriaxone   Acute on chronic respiratory failure in the setting of COPD Patient has known history of COPD.  On arrival to the emergency department patient was on a nonrebreather with O2 sats in the 90s but was unable to protect his airway so he was intubated. -Currently on full vent support -Wean FiO2 for sats greater than 90% -Vent bundle -Daily SBT/WA when clinically appropriate  NSTEMI? EKG on arrival showed sinus tachycardia.  Patient was unresponsive.  Troponin elevated at 6484 with 2-hour recheck decreased to 6476.  Most likely secondary to decreased perfusion in the setting of shock/cocaine use. -Statin 80 mg daily -Echocardiogram pending -Repeat EKG -Consider cardiology consult  AKI Baseline creatinine between one-point 8-1.  On arrival to the emergency department creatinine was 2.7.  Improved with volume resuscitation today to 1.68. -Continue to monitor with daily BMPs -Strict I's and O's -Avoid nephrotoxic agents  History of polysubstance abuse (cocaine/marijuana/tobacco) Patient's family reported prior to arrival but the patient used cocaine and marijuana shortly before he was last seen well at approximately 2300 on 2/22.  UDS consistent with this.  Best Practice:  Diet: N.p.o. Pain/Anxiety/Delirium  protocol (if indicated): Sedated at this time VAP protocol (if indicated): Ordered DVT prophylaxis: SCDs  GI prophylaxis: Pepcid  Glucose control: Sensitive sliding scale insulin Mobility: Bedrest Code Status: Full code Disposition: ICU  Labs   CBC: Recent Labs  Lab 07/09/2020 0937 07/03/2020 1012 07/09/2020 1140 07/11/2020 1247 07/25/20 0126 07/25/20 0610  WBC 15.6*  --   --  20.8* 16.7*  --   NEUTROABS 12.6*  --   --   --   --   --   HGB 17.0 18.4* 15.3 16.2 15.2 13.9  HCT 51.5 54.0* 45.0 51.8 48.6 41.0  MCV 92.8  --   --  94.9 94.2  --   PLT 365  --   --  309 305  --    Basic Metabolic Panel: Recent Labs  Lab 07/04/2020 0937 07/09/2020 1012 07/27/2020 1140 07/15/2020 1247 07/25/20 0126 07/25/20 0610  NA 140 140 143 140 141 140  K 5.4* 5.3* 4.1 5.1 4.5 4.2  CL 102 106  --  106 108  --   CO2 22  --   --  21* 24  --   GLUCOSE 141* 140*  --  148* 128*  --   BUN 23* 28*  --  23* 24*  --   CREATININE 2.76* 2.70*  --  2.48* 1.68*  --   CALCIUM 9.0  --   --  7.8* 8.3*  --   MG  --   --   --  1.7 2.7*  --   PHOS  --   --   --  5.0* 2.2*  --    GFR: Estimated Creatinine Clearance: 49.1 mL/min (A) (by C-G formula based on SCr of 1.68 mg/dL (H)). Recent Labs  Lab 07/25/2020 0937 07/17/2020 0938 07/07/2020 1247 07/10/2020 1515 07/25/20 0126  PROCALCITON  --   --  7.20  --   --   WBC 15.6*  --  20.8*  --  16.7*  LATICACIDVEN  --  3.0* 3.3* 3.2*  --     Liver Function Tests: Recent Labs  Lab 07/02/2020 0937 07/19/2020 1247  AST 50* 74*  ALT 44 55*  ALKPHOS 88 77  BILITOT 0.6 0.9  PROT 7.5 6.6  ALBUMIN 4.0 3.4*   Recent Labs  Lab 07/02/2020 1247  LIPASE 32  AMYLASE 614*   Recent Labs  Lab 07/11/2020 0937  AMMONIA 43*    ABG    Component Value Date/Time   PHART 7.346 (L) 07/25/2020 0610   PCO2ART 42.1 07/25/2020 0610   PO2ART 76 (L) 07/25/2020 0610   HCO3 23.0 07/25/2020 0610   TCO2 24 07/25/2020 0610   ACIDBASEDEF 3.0 (H) 07/25/2020 0610   O2SAT 94.0 07/25/2020 0610      Coagulation Profile: Recent Labs  Lab 07/06/2020 0937 07/02/2020 1247  INR 1.1 1.1    Cardiac Enzymes: Recent Labs  Lab 07/23/2020 0937  CKTOTAL 294    HbA1C: Hgb A1c MFr Bld  Date/Time Value Ref Range Status  07/25/2020 01:26 AM 6.1 (H) 4.8 - 5.6 % Final    Comment:    (NOTE) Pre diabetes:          5.7%-6.4%  Diabetes:              >6.4%  Glycemic control for   <7.0% adults with diabetes     CBG: Recent Labs  Lab 07/23/2020 1534 07/03/2020 1930 07/13/2020 2324 07/25/20 0327 07/25/20 0738  GLUCAP 156* 114* 89 102* 85    Past Medical History  He,  has a past medical history of Chronic back pain, Cocaine abuse ([place]) (08/09/2016), COPD (chronic obstructive pulmonary disease) ([place]), Hypertension, Poor dentition (09/27/2012), Right spontaneous pneumothorax (08/07/2016), and Tobacco abuse (03/24/2012).   Surgical History    Past Surgical History:  Procedure Laterality Date  . NO PAST SURGERIES    . VIDEO ASSISTED THORACOSCOPY (VATS)/WEDGE RESECTION Right 02/27/2018   Procedure: VIDEO ASSISTED THORACOSCOPY (VATS)/WEDGE RESECTION;  Surgeon: Melrose Nakayama, MD;  Location: [facility];  Service: Thoracic;  Laterality: Right;     Social History   reports that he has been smoking cigarettes. He started smoking about 47 years ago. He has a 67.50 pack-year smoking history. He has never used smokeless tobacco. He reports that he does not drink alcohol.   Family History   His family history includes Arthritis in his mother; Cancer in his father; Diabetes in his mother; Hyperlipidemia in his mother; Hypertension in his mother; Kidney disease in his mother; Stroke in his mother.   Allergies Allergies  Allergen Reactions  . Tylenol [Acetaminophen] Nausea And Vomiting     Home Medications  Prior to Admission medications   Medication Sig Start Date End Date Taking? Authorizing Provider  albuterol (VENTOLIN HFA) 108 (90 Base) MCG/ACT inhaler Inhale 2 puffs into the lungs every 6  (six) hours as needed for wheezing or shortness of breath.   Yes [provider]  aspirin EC 81 MG tablet Take 162 mg by mouth daily as needed (pain).   Yes [provider]  atorvastatin (LIPITOR) 20 MG tablet Take 1 tablet by mouth daily. 12/31/19  Yes [provider]  ipratropium-albuterol (DUONEB) 0.5-2.5 (3) MG/3ML SOLN Take 3 mLs by nebulization every 6 (six) hours as needed (wheezing, shortness of breath). 03/04/18  Yes Bonnielee Haff, MD  oxyCODONE (OXY IR/ROXICODONE) 5 MG immediate release tablet Take 1 tablet (5 mg total) by mouth every 6 (six) hours as needed for severe pain. Take 5 mg by mouth every 4-6 hours PRN severe pain 03/29/18  Yes Melrose Nakayama, MD   Gifford Shave, MD  PGY-2, [facility]  07/25/20  7:56 AM

## 2020-07-25 NOTE — Progress Notes (Signed)
Carotid duplex has been completed.   Preliminary results in CV Proc.   Abram Sander 07/25/2020 9:14 AM

## 2020-07-25 NOTE — Progress Notes (Signed)
  Echocardiogram 2D Echocardiogram has been performed.  Fidel Levy 07/25/2020, 1:59 PM

## 2020-07-25 NOTE — Progress Notes (Signed)
OT Cancellation Note  Patient Details Name: Benjamin Burke MRN: [id number] DOB: Sep 30, 1960   Cancelled Treatment:    Reason Eval/Treat Not Completed: Patient not medically ready;Active bedrest order. Will assess when activity orders updated and pt is medically appropriate. Thanks  [place], OT/L   Acute OT Clinical Specialist Acute Rehabilitation Services Pager [phone] Office [phone]  07/25/2020, 8:35 AM

## 2020-07-25 NOTE — Progress Notes (Signed)
Initial Nutrition Assessment  DOCUMENTATION CODES:   Not applicable  INTERVENTION:   Initiate tube feeding via OG tube: Vital AF 1.2 at 70 ml/h (1680 ml per day)  Provides 2016 kcal, 126 gm protein, 1362 ml free water daily  NUTRITION DIAGNOSIS:   Inadequate oral intake related to inability to eat as evidenced by NPO status.  GOAL:   Patient will meet greater than or equal to 90% of their needs  MONITOR:   Vent status,TF tolerance,Labs  REASON FOR ASSESSMENT:   Ventilator,Rounds    ASSESSMENT:   60 yo male admitted with AMS and fever. PMH includes cocaine abuse, COPD, HTN, tobacco abuse, poor dentition.  Discussed patient in ICU rounds and with RN today. No plans to extubate today. RD to order TF. OG tube in place.  Patient is currently intubated on ventilator support MV: 10.5 L/min Temp (24hrs), Avg:99.5 F (37.5 C), Min:98.78 F (37.1 C), Max:100.76 F (38.2 C)   Labs reviewed. Phos 2.2, mag 2.7 CBG: 102-85  Medications reviewed and include phos-nak. IVF: LR at 100 ml/h   NUTRITION - FOCUSED PHYSICAL EXAM:  Flowsheet Row Most Recent Value  Orbital Region No depletion  Upper Arm Region No depletion  Thoracic and Lumbar Region No depletion  Buccal Region Unable to assess  Temple Region No depletion  Clavicle Bone Region No depletion  Clavicle and Acromion Bone Region No depletion  Scapular Bone Region Unable to assess  Dorsal Hand Unable to assess  Patellar Region No depletion  Anterior Thigh Region No depletion  Posterior Calf Region No depletion  Edema (RD Assessment) None  Hair Reviewed  Eyes Unable to assess  Mouth Unable to assess  Skin Reviewed  Nails Unable to assess       Diet Order:   Diet Order            Diet NPO time specified  Diet effective now                 EDUCATION NEEDS:   No education needs have been identified at this time  Skin:  Skin Assessment: Reviewed RN Assessment  Last BM:  PTA  Height:    Ht Readings from Last 1 Encounters:  07/11/2020 5\' 7"  (1.702 m)    Weight:   Wt Readings from Last 1 Encounters:  07/25/20 79.6 kg    Ideal Body Weight:  67.3 kg  BMI:  Body mass index is 27.49 kg/m.  Estimated Nutritional Needs:   Kcal:  2015  Protein:  110-130 gm  Fluid:  >/=2 L    Lucas Mallow, RD, LDN, CNSC Please refer to Amion for contact information.

## 2020-07-25 NOTE — Progress Notes (Signed)
STROKE TEAM PROGRESS NOTE   INTERVAL HISTORY No family is at the bedside.  Pt still intubated, off sedation, intermittent coughing and gaging. Still not open eyes or following commands, slight withdraw b/l LEs. Still has fever and leukocytosis. Blood culture no growth for one day. MRI showed mostly watershed infarcts, MRA negative. TTE no vegetation but EF 30-35%. CUS neg.    OBJECTIVE Vitals:   07/25/20 0400 07/25/20 0404 07/25/20 0430 07/25/20 0500  BP: (!) 131/94  (!) 122/96 (!) 145/106  Pulse: 81   82  Resp: (!) 22  (!) 22 16  Temp: 98.96 F (37.Burke C)  98.78 F (37.1 C) 98.78 F (37.1 C)  TempSrc: Bladder     SpO2: 99% 100%  (!) 76%  Weight:    79.6 kg  Height:        CBC:  Recent Labs  Lab 07/15/2020 0937 07/05/2020 1012 07/15/2020 1247 07/25/20 0126  WBC 15.6*  --  20.8* 16.7*  NEUTROABS 12.6*  --   --   --   HGB 17.0   < > 16.Burke 15.Burke  HCT 51.5   < > 51.8 48.6  MCV 92.8  --  94.9 94.Burke  PLT 365  --  309 305   < > = values in this interval not displayed.    Basic Metabolic Panel:  Recent Labs  Lab 07/21/2020 1247 07/25/20 0126  NA 140 141  K 5.1 4.5  CL 106 108  CO2 21* 24  GLUCOSE 148* 128*  BUN 23* 24*  CREATININE Burke.48* 1.68*  CALCIUM 7.8* 8.3*  MG 1.7 Burke.7*  PHOS 5.0* Burke.Burke*    Lipid Panel:     Component Value Date/Time   CHOL 116 07/25/2020 0126   TRIG 96 07/25/2020 0126   TRIG 99 07/25/2020 0126   HDL 36 (L) 07/25/2020 0126   CHOLHDL 3.Burke 07/25/2020 0126   VLDL 20 07/25/2020 0126   LDLCALC 60 07/25/2020 0126   HgbA1c:  Lab Results  Component Value Date   HGBA1C 6.1 (H) 07/25/2020   Urine Drug Screen:     Component Value Date/Time   LABOPIA NONE DETECTED 07/22/2020 1025   COCAINSCRNUR POSITIVE (A) 07/15/2020 1025   LABBENZ NONE DETECTED 07/07/2020 1025   AMPHETMU NONE DETECTED 07/29/2020 1025   THCU POSITIVE (A) 07/25/2020 1025   LABBARB NONE DETECTED 07/29/2020 1025    Alcohol Level     Component Value Date/Time   ETH <10 07/08/2020 0939     IMAGING   DG Chest 1 View  Result Date: Burke/24/2022 CLINICAL DATA:  Aspiration. EXAM: CHEST  1 VIEW COMPARISON:  07/12/2020.  CT 12/03/2019. FINDINGS: Endotracheal tube and NG tube in stable position. Heart size normal. Postsurgical changes right upper lung. COPD. Low lung volumes. Persistent bilateral interstitial prominence, pneumonitis cannot be excluded. No pleural effusion or pneumothorax. IMPRESSION: 1. Lines and tubes in stable position. Burke. COPD. Low lung volumes. Persistent bilateral interstitial prominence, pneumonitis cannot be excluded. Electronically Signed   By: Marcello Moores  Register   On: 07/25/2020 05:09   CT Head Wo Contrast  Result Date: 07/23/2020 CLINICAL DATA:  60 year old male with altered mental status, poorly responsive. EXAM: CT HEAD WITHOUT CONTRAST TECHNIQUE: Contiguous axial images were obtained from the base of the skull through the vertex without intravenous contrast. COMPARISON:  None. FINDINGS: Brain: Patchy bilateral fairly symmetric white matter hypodensity in both hemispheres. Confluent asymmetric mixed hypo and hyperdensity in the superior left occipital pole, series 3, image 14 and coronal image 63. No similar  cortical hypodensity elsewhere. Deep gray nuclei and posterior fossa gray-white differentiation remains within normal limits. No associated intracranial mass effect. No midline shift. No ventriculomegaly. No extra-axial hemorrhage identified. Vascular: Mild Calcified atherosclerosis at the skull base. Suggestion of fetal type left PCA origin. Questionable hyperdense left PCA P2 are P3 segment on series 3, image 15. No other suspicious intracranial vascular hyperdensity. Skull: Negative. Sinuses/Orbits: Right nasal airway in place. Small volume retained secretions in the nasopharynx. Mild mucosal thickening and bubbly opacity in the left ethmoid and maxillary sinuses. Tympanic cavities and mastoids are clear. Other: Visualized orbits and scalp soft tissues are within  normal limits. IMPRESSION: 1. Abnormal mixed hypo- and hyperdensity in the superior left occipital lobe encompassing roughly 3 cm. [name] this is an acute or subacute Left PCA territory infarct with petechial hemorrhage (questionable hyperdense left PCA) - but recommend Head MRI (without contrast may suffice) to confirm. Burke. No significant intracranial mass effect. Superimposed age advanced bilateral white matter changes, most commonly due to chronic small vessel disease. 3. Right nasal airway in place. Mild left paranasal sinus inflammation. Electronically Signed   By: Genevie Ann M.D.   On: 07/11/2020 10:07   MR ANGIO HEAD WO CONTRAST  Result Date: 07/04/2020 CLINICAL DATA:  Acute stroke presentation. Altered mental status. Left PCA territory infarction by head CT. EXAM: MRI HEAD WITHOUT CONTRAST MRA HEAD WITHOUT CONTRAST TECHNIQUE: Multiplanar, multiecho pulse sequences of the brain and surrounding structures were obtained without intravenous contrast. Angiographic images of the head were obtained using MRA technique without contrast. COMPARISON:  Head CT earlier same day. FINDINGS: MRI HEAD FINDINGS Brain: No acute infarction affects the brainstem or cerebellum. 3 cm acute infarction at the left parietooccipital junction on the left. Watershed pattern infarctions extending front to back affecting both hemispheres at the watershed regions. Cortical and white matter involvement as is typical. Mild chronic small-vessel ischemic change of the cerebral hemispheric white matter otherwise is suspected. No evidence of mass, frank hematoma, hydrocephalus or extra-axial collection. Minor petechial blood products present in the left parietooccipital junction region infarction. Vascular: Major vessels at the base of the brain show flow. Skull and upper cervical spine: Negative Sinuses/Orbits: Mucosal inflammatory changes of the paranasal sinuses. Orbits are negative. Other: None MRA HEAD FINDINGS Both internal carotid  arteries are widely patent through the skull base and siphon regions. The anterior and middle cerebral vessels are patent. 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 Fetal origin of the left PCA from the anterior circulation. Dominant left vertebral artery widely patent to the basilar. Non dominant right vertebral artery shows atherosclerotic narrowing and irregularity with does give flow to the basilar. The basilar artery shows atherosclerotic irregularity but no stenosis. Superior cerebellar and posterior cerebral arteries show flow. As noted above, left PCA arises from the anterior circulation. IMPRESSION: 1. Watershed pattern infarctions extending front to back affecting both hemispheres at the watershed regions. 3 cm acute infarction at the left parietooccipital junction was visible at CT mild petechial blood products in the left parietooccipital infarction region. No evidence of frank hematoma or mass effect. Findings most likely secondary to global hypoperfusion event. Burke. Background pattern of mild chronic small-vessel ischemic change of the cerebral hemispheric white matter elsewhere. 3. MR angiography does not show any large or medium vessel occlusion. There is atherosclerotic irregularity of the non dominant right vertebral artery and the basilar artery but no significant stenosis. 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 Fetal origin of the left PCA from  the anterior circulation. Electronically Signed   By: Nelson Chimes M.D.   On: 07/26/2020 18:00   MR BRAIN WO CONTRAST  Result Date: 07/21/2020 CLINICAL DATA:  Acute stroke presentation. Altered mental status. Left PCA territory infarction by head CT. EXAM: MRI HEAD WITHOUT CONTRAST MRA HEAD WITHOUT CONTRAST TECHNIQUE: Multiplanar, multiecho pulse sequences of the brain and surrounding structures were obtained without intravenous contrast. Angiographic images of the head were obtained using MRA technique without contrast.  COMPARISON:  Head CT earlier same day. FINDINGS: MRI HEAD FINDINGS Brain: No acute infarction affects the brainstem or cerebellum. 3 cm acute infarction at the left parietooccipital junction on the left. Watershed pattern infarctions extending front to back affecting both hemispheres at the watershed regions. Cortical and white matter involvement as is typical. Mild chronic small-vessel ischemic change of the cerebral hemispheric white matter otherwise is suspected. No evidence of mass, frank hematoma, hydrocephalus or extra-axial collection. Minor petechial blood products present in the left parietooccipital junction region infarction. Vascular: Major vessels at the base of the brain show flow. Skull and upper cervical spine: Negative Sinuses/Orbits: Mucosal inflammatory changes of the paranasal sinuses. Orbits are negative. Other: None MRA HEAD FINDINGS Both internal carotid arteries are widely patent through the skull base and siphon regions. The anterior and middle cerebral vessels are patent. 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 Fetal origin of the left PCA from the anterior circulation. Dominant left vertebral artery widely patent to the basilar. Non dominant right vertebral artery shows atherosclerotic narrowing and irregularity with does give flow to the basilar. The basilar artery shows atherosclerotic irregularity but no stenosis. Superior cerebellar and posterior cerebral arteries show flow. As noted above, left PCA arises from the anterior circulation. IMPRESSION: 1. Watershed pattern infarctions extending front to back affecting both hemispheres at the watershed regions. 3 cm acute infarction at the left parietooccipital junction was visible at CT mild petechial blood products in the left parietooccipital infarction region. No evidence of frank hematoma or mass effect. Findings most likely secondary to global hypoperfusion event. Burke. Background pattern of mild chronic small-vessel ischemic  change of the cerebral hemispheric white matter elsewhere. 3. MR angiography does not show any large or medium vessel occlusion. There is atherosclerotic irregularity of the non dominant right vertebral artery and the basilar artery but no significant stenosis. 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 Fetal origin of the left PCA from the anterior circulation. Electronically Signed   By: Nelson Chimes M.D.   On: 07/18/2020 18:00   DG Chest Portable 1 View  Result Date: 07/26/2020 CLINICAL DATA:  Intubation EXAM: PORTABLE CHEST 1 VIEW COMPARISON:  07/23/2020 at 0948 hours FINDINGS: Interval placement of endotracheal tube distal tip terminating approximately 5.7 cm above the carina. Interval placement of enteric tube with distal tip extending beyond the inferior margin of the film. Stable cardiomediastinal contours. Mild pulmonary vascular congestion. Hazy perihilar and bibasilar interstitial opacities, right slightly worse than left. No pleural effusion. No pneumothorax. Postsurgical changes and scarring within the right upper lobe. IMPRESSION: 1. Satisfactory endotracheal tube and enteric tube positioning. Burke. Hazy perihilar and bibasilar interstitial opacities, right slightly worse than left, could reflect edema versus infection. Electronically Signed   By: Davina Poke D.O.   On: 07/13/2020 11:35   DG Chest Port 1 View  Result Date: 07/29/2020 CLINICAL DATA:  Altered level of consciousness. EXAM: PORTABLE CHEST 1 VIEW COMPARISON:  June 21, 2018. FINDINGS: The heart size and mediastinal contours are within normal  limits. No pneumothorax or pleural effusion is noted. Stable postsurgical changes and scarring are noted in right upper lobe. No acute abnormality is noted. The visualized skeletal structures are unremarkable. IMPRESSION: No active disease. Electronically Signed   By: Marijo Conception M.D.   On: 07/16/2020 10:14   Transthoracic Echocardiogram  1. Left ventricular  ejection fraction, by estimation, is 30 to 35%. The  left ventricle has moderately decreased function. The left ventricle  demonstrates global hypokinesis. Left ventricular diastolic parameters are  consistent with Grade I diastolic  dysfunction (impaired relaxation).  Burke. Right ventricular systolic function is normal. The right ventricular  size is normal.  3. The mitral valve is normal in structure. No evidence of mitral valve  regurgitation. No evidence of mitral stenosis.  4. The aortic valve is normal in structure. Aortic valve regurgitation is  not visualized. No aortic stenosis is present.  5. The inferior vena cava is normal in size with greater than 50%  respiratory variability, suggesting right atrial pressure of 3 mmHg.   Bilateral Carotid Dopplers  Right Carotid: Velocities in the right ICA are consistent with a 1-39%  stenosis. The ECA appears occluded.  Left Carotid: Velocities in the left ICA are consistent with a 1-39%  stenosis.  Vertebrals: Bilateral vertebral arteries demonstrate antegrade flow.   ECG - ST rate 112 BPM. (See cardiology reading for complete details)   PHYSICAL EXAM  Temp:  [98.78 F (37.1 C)-100.76 F (38.Burke C)] 100.4 F (38 C) (02/24 1600) Pulse Rate:  [77-131] 92 (02/24 1600) Resp:  [14-30] 21 (02/24 1600) BP: (96-200)/(74-110) 131/79 (02/24 1600) SpO2:  [76 %-100 %] 98 % (02/24 1600) FiO2 (%):  [40 %] 40 % (02/24 1538) Weight:  [79.6 kg] 79.6 kg (02/24 0500)  General - Well nourished, well developed, intubated off sedation.  Ophthalmologic - fundi not visualized due to noncooperation.  Cardiovascular - Regular rhythm with tachycardia.  Neuro - intubated off sedation, eyes closed, briefly open with voice, not following commands. With forced eye opening, eyes in mid position, not blinking to visual threat, doll's eyes present, not tracking, PERRL. Corneal reflex weakly present bilaterally, gag and cough present. Breathing over the  vent.  Facial symmetry not able to test due to ET tube.  Tongue protrusion not cooperative. On pain stimulation, no movement of BUEs, slight withdraw BLEs, R>L. DTR diminished and no babinski. Sensation, coordination and gait not tested. Nuchal rigidity however have gag and cough at the same time.    ASSESSMENT/PLAN Benjamin Burke is a 60 y.o. male with history of cocaine abuse, chronic back pain, hypertension, COPD and tobacco abuse, brought into the emergency room after he was  found down by family.  He did not receive IV t-PA due to late presentation (>4.5 hours from time of onset).  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 However, cardioembolic source can not be ruled out, need TEE to rule out LV thrombus or endocarditis  CT head - Abnormal mixed hypo- and hyperdensity in the superior left occipital lobe encompassing roughly 3 cm. [name] this is an acute or subacute Left PCA territory infarct with petechial hemorrhage (questionable hyperdense left PCA)   MRI head - Watershed pattern infarctions extending front to back affecting both hemispheres at the watershed regions. 3 cm acute infarction at the left parietooccipital junction was visible at CT mild petechial blood products in the left parietooccipital infarction region.  MRA head - unremarkable, nondominant right VA with distal occlusion  Carotid Doppler - unremarkable  2D Echo - EF 30-35% but no vegetation  Recommend TEE to rule out LV thrombus or endocarditis  Benjamin Burke - negative  LDL - 60  HgbA1c - 6.1  UDS - Cocaine and THCU  VTE prophylaxis - heparin IV  aspirin 81 mg daily prior to admission, now on No antithrombotic given concerns of endocarditis  Ongoing aggressive stroke risk factor management  Therapy recommendations:  pending  Disposition:  Pending  Sepsis Fever  Leukocytosis  Tmax  100.8  Leukocytosis - WBC's - 15.6->20.8->16.7  Blood cultures pending  UA neg  CXR Persistent bilateral interstitial prominence, pneumonitis cannot be excluded.  On Vancomycin + Cefepime -> Rocephin  Recommend LP to rule out meningitis/encephalitis  troponinemia    Trop 1605->6484->6476  EF 30-35%, no vegetation  CCM on board  Recommend TEE to rule out LV thrombus or endocarditis  Respiratory failure  Intubated on vent  CCM on board  Not extubate candidate now given mental status  AKI  creatinine - Burke.70->Burke.48->1.68   Improving  BMP monitoring  On IVF  Hypertension  Home BP meds: none   Current BP meds: none   Stable . Avoid low BP . Long-term BP goal normotensive  Hyperlipidemia  Home Lipid lowering medication: Lipitor 20 mg daily  LDL 60, goal < 70  Hold off statin due to elevated LFTs  AST/ALT 74/55  Continue statin at discharge  Cocaine abuse   Hx of cocaine use  UDS positive for cocaine  Cessation education will be provided  Tobacco abuse  Current smoker  Smoking cessation counseling will be provided  Other Stroke Risk Factors  Family hx stroke (mother)   Substance abuse - THC positive, cessation education will be provided  Other Active Problems, Findings, Recommendations and/or Plan  Code status - Full code   Elevate LFTs  Hospital day # 1  This patient is critically ill due to stroke, sepsis, troponemia, AKI, cocaine use, fever and at significant risk of neurological worsening, death form septic shock, endocarditis, recurrent stroke, heart failure, seizure, meningitis. This patient's care requires constant monitoring of vital signs, hemodynamics, respiratory and cardiac monitoring, review of multiple databases, neurological assessment, discussion with family, other specialists and medical decision making of high complexity. I spent 40 minutes of neurocritical care time in the care of this patient. I have discussed with  Dr. Londell Moh, MD PhD Stroke Neurology Burke/24/2022 5:33 PM     To contact Stroke Continuity provider, please refer to [email or web address] After hours, contact General Neurology

## 2020-07-25 NOTE — TOC Initial Note (Signed)
Transition of Care [facility]) - Initial/Assessment Note    Patient Details  Name: ISAIH BULGER MRN: [id number] Date of Birth: 1961-01-14  Transition of Care [facility]) CM/SW Contact:    Ninfa Meeker, RN Phone Number: 07/25/2020, 9:10 AM  Clinical Narrative:  Case Manager acknowledges order for CM consult. Patient from home.  currently on Vent, sedated.TOC Team will continue to follow for needs.         Patient Goals and CMS Choice        Expected Discharge Plan and Services                                                Prior Living Arrangements/Services                       Activities of Daily Living      Permission Sought/Granted                  Emotional Assessment              Admission diagnosis:  AKI (acute kidney injury) ([place]) [N17.9] Cerebrovascular accident (CVA), unspecified mechanism ([place]) [I63.9] AMS (altered mental status) [R41.82] Sepsis, due to unspecified organism, unspecified whether acute organ dysfunction present [facility]) [A41.9] Patient Active Problem List   Diagnosis Date Noted   Cerebral embolism with cerebral infarction 07/25/2020   AMS (altered mental status) 07/23/2020   Bleb, lung ([place]) 02/27/2018   Pneumothorax 02/26/2018   Hypertensive urgency 02/26/2018   Pneumothorax on right 02/26/2018   Cocaine abuse ([place]) 08/09/2016   Right spontaneous pneumothorax 08/07/2016   Bronchitis 08/07/2016   Nicotine dependence, cigarettes, uncomplicated 87/68/1157   Radiculopathy of lumbosacral region 09/12/2013   Chronic bilateral low back pain without sciatica 09/12/2013   COPD (chronic obstructive pulmonary disease) ([place]) 01/27/2013   Hyperglycemia 01/24/2013   Essential hypertension 11/21/2012   Hyperlipidemia with target LDL less than 100 11/21/2012   Poor dentition 09/27/2012   Tobacco abuse 03/24/2012   PCP:  Vicenta Aly, FNP Pharmacy:   [id number] - [place], [place] - [address] [place] [address] Phone: [phone] Fax: [phone]     Social Determinants of Health (SDOH) Interventions    Readmission Risk Interventions No flowsheet data found.

## 2020-07-26 ENCOUNTER — Inpatient Hospital Stay (HOSPITAL_COMMUNITY): Payer: Medicare Other

## 2020-07-26 DIAGNOSIS — I5021 Acute systolic (congestive) heart failure: Secondary | ICD-10-CM | POA: Diagnosis not present

## 2020-07-26 DIAGNOSIS — F1410 Cocaine abuse, uncomplicated: Secondary | ICD-10-CM

## 2020-07-26 DIAGNOSIS — R401 Stupor: Secondary | ICD-10-CM | POA: Diagnosis not present

## 2020-07-26 DIAGNOSIS — N179 Acute kidney failure, unspecified: Secondary | ICD-10-CM | POA: Diagnosis not present

## 2020-07-26 DIAGNOSIS — T17800S Unspecified foreign body in other parts of respiratory tract causing asphyxiation, sequela: Secondary | ICD-10-CM | POA: Diagnosis not present

## 2020-07-26 DIAGNOSIS — R7989 Other specified abnormal findings of blood chemistry: Secondary | ICD-10-CM

## 2020-07-26 DIAGNOSIS — R778 Other specified abnormalities of plasma proteins: Secondary | ICD-10-CM

## 2020-07-26 DIAGNOSIS — I639 Cerebral infarction, unspecified: Secondary | ICD-10-CM | POA: Diagnosis not present

## 2020-07-26 LAB — CBC
HCT: 36.8 % — ABNORMAL LOW (ref 39.0–52.0)
Hemoglobin: 12.1 g/dL — ABNORMAL LOW (ref 13.0–17.0)
MCH: 30.6 pg (ref 26.0–34.0)
MCHC: 32.9 g/dL (ref 30.0–36.0)
MCV: 93.2 fL (ref 80.0–100.0)
Platelets: 263 10*3/uL (ref 150–400)
RBC: 3.95 MIL/uL — ABNORMAL LOW (ref 4.22–5.81)
RDW: 15.5 % (ref 11.5–15.5)
WBC: 14.9 10*3/uL — ABNORMAL HIGH (ref 4.0–10.5)
nRBC: 0 % (ref 0.0–0.2)

## 2020-07-26 LAB — CSF CELL COUNT WITH DIFFERENTIAL
RBC Count, CSF: 7 /mm3 — ABNORMAL HIGH
Tube #: 3
WBC, CSF: 2 /mm3 (ref 0–5)

## 2020-07-26 LAB — GLUCOSE, CAPILLARY
Glucose-Capillary: 104 mg/dL — ABNORMAL HIGH (ref 70–99)
Glucose-Capillary: 110 mg/dL — ABNORMAL HIGH (ref 70–99)
Glucose-Capillary: 116 mg/dL — ABNORMAL HIGH (ref 70–99)
Glucose-Capillary: 124 mg/dL — ABNORMAL HIGH (ref 70–99)
Glucose-Capillary: 93 mg/dL (ref 70–99)
Glucose-Capillary: 97 mg/dL (ref 70–99)

## 2020-07-26 LAB — MAGNESIUM
Magnesium: 2.2 mg/dL (ref 1.7–2.4)
Magnesium: 2 mg/dL (ref 1.7–2.4)

## 2020-07-26 LAB — BASIC METABOLIC PANEL
Anion gap: 9 (ref 5–15)
BUN: 20 mg/dL (ref 6–20)
CO2: 26 mmol/L (ref 22–32)
Calcium: 8.2 mg/dL — ABNORMAL LOW (ref 8.9–10.3)
Chloride: 104 mmol/L (ref 98–111)
Creatinine, Ser: 1.03 mg/dL (ref 0.61–1.24)
GFR, Estimated: >60 mL/min (ref >60)
Glucose, Bld: 117 mg/dL — ABNORMAL HIGH (ref 70–99)
Potassium: 3.7 mmol/L (ref 3.5–5.1)
Sodium: 139 mmol/L (ref 135–145)

## 2020-07-26 LAB — PHOSPHORUS
Phosphorus: 1.6 mg/dL — ABNORMAL LOW (ref 2.5–4.6)
Phosphorus: 2.9 mg/dL (ref 2.5–4.6)
Phosphorus: 3.3 mg/dL (ref 2.5–4.6)

## 2020-07-26 LAB — PROTEIN, CSF: Total  Protein, CSF: 31 mg/dL (ref 15–45)

## 2020-07-26 LAB — GLUCOSE, CSF: Glucose, CSF: 82 mg/dL — ABNORMAL HIGH (ref 40–70)

## 2020-07-26 MED ORDER — ATORVASTATIN CALCIUM 40 MG PO TABS
80.0000 mg | ORAL_TABLET | Freq: Every day | ORAL | Status: DC
  Administered 2020-07-26: 80 mg via ORAL
  Filled 2020-07-26 (×2): qty 2

## 2020-07-26 MED ORDER — FAMOTIDINE 40 MG/5ML PO SUSR
20.0000 mg | Freq: Two times a day (BID) | ORAL | Status: DC
  Administered 2020-07-26 – 2020-08-01 (×12): 20 mg
  Filled 2020-07-26 (×12): qty 2.5

## 2020-07-26 MED ORDER — ATORVASTATIN CALCIUM 40 MG PO TABS
80.0000 mg | ORAL_TABLET | Freq: Every day | ORAL | Status: DC
  Administered 2020-07-27 – 2020-08-01 (×6): 80 mg
  Filled 2020-07-26 (×6): qty 2

## 2020-07-26 MED ORDER — FENTANYL 2500MCG IN NS 250ML (10MCG/ML) PREMIX INFUSION
0.0000 ug/h | INTRAVENOUS | Status: DC
  Administered 2020-07-26 – 2020-07-28 (×3): 100 ug/h via INTRAVENOUS
  Administered 2020-07-29: 175 ug/h via INTRAVENOUS
  Administered 2020-07-30: 150 ug/h via INTRAVENOUS
  Administered 2020-07-30 – 2020-08-01 (×3): 200 ug/h via INTRAVENOUS
  Administered 2020-08-01: 300 ug/h via INTRAVENOUS
  Filled 2020-07-26 (×10): qty 250

## 2020-07-26 MED ORDER — DOCUSATE SODIUM 50 MG/5ML PO LIQD
100.0000 mg | Freq: Two times a day (BID) | ORAL | Status: DC
  Administered 2020-07-26 – 2020-07-29 (×8): 100 mg
  Filled 2020-07-26 (×8): qty 10

## 2020-07-26 MED ORDER — POLYETHYLENE GLYCOL 3350 17 G PO PACK
17.0000 g | PACK | Freq: Every day | ORAL | Status: DC
  Administered 2020-07-26 – 2020-07-29 (×4): 17 g
  Filled 2020-07-26 (×4): qty 1

## 2020-07-26 MED ORDER — LIDOCAINE HCL (PF) 1 % IJ SOLN
5.0000 mL | Freq: Once | INTRAMUSCULAR | Status: AC
  Administered 2020-07-26: 5 mL via INTRADERMAL

## 2020-07-26 MED ORDER — DEXMEDETOMIDINE HCL IN NACL 400 MCG/100ML IV SOLN
0.0000 ug/kg/h | INTRAVENOUS | Status: AC
  Administered 2020-07-26 – 2020-07-27 (×2): 0.4 ug/kg/h via INTRAVENOUS
  Administered 2020-07-27: 0.5 ug/kg/h via INTRAVENOUS
  Administered 2020-07-28: 0.8 ug/kg/h via INTRAVENOUS
  Administered 2020-07-28: 0.6 ug/kg/h via INTRAVENOUS
  Administered 2020-07-28: 0.8 ug/kg/h via INTRAVENOUS
  Administered 2020-07-28: 0.9 ug/kg/h via INTRAVENOUS
  Administered 2020-07-29: 0.7 ug/kg/h via INTRAVENOUS
  Administered 2020-07-29: 1 ug/kg/h via INTRAVENOUS
  Filled 2020-07-26 (×3): qty 100
  Filled 2020-07-26: qty 200
  Filled 2020-07-26 (×4): qty 100

## 2020-07-26 MED ORDER — LOSARTAN POTASSIUM 25 MG PO TABS: 25.0000 mg | ORAL_TABLET | Freq: Every day | ORAL | Status: DC

## 2020-07-26 MED ORDER — POTASSIUM PHOSPHATES 15 MMOLE/5ML IV SOLN
45.0000 mmol | Freq: Once | INTRAVENOUS | Status: AC
  Administered 2020-07-26: 45 mmol via INTRAVENOUS
  Filled 2020-07-26: qty 15

## 2020-07-26 MED ORDER — LOSARTAN POTASSIUM 25 MG PO TABS
25.0000 mg | ORAL_TABLET | Freq: Every day | ORAL | Status: DC
  Administered 2020-07-27 – 2020-08-01 (×6): 25 mg
  Filled 2020-07-26 (×6): qty 1

## 2020-07-26 NOTE — Procedures (Signed)
CLINICAL DATA: [Altered mental status.  Watershed infarction on MRI.  Intubated patient.  EXAM: DIAGNOSTIC LUMBAR PUNCTURE UNDER FLUOROSCOPIC GUIDANCE FLUOROSCOPY TIME: Fluoroscopy Time: [48 seconds] Radiation Exposure Index (if provided by the fluoroscopic device): [12.9 mGy] Number of Acquired Spot Images: [1] PROCEDURE: Informed consent was obtained from the patient prior to the procedure, including potential complications of headache, allergy, and pain. With the patient prone, the lower back was prepped with Betadine. 1% Lidocaine was used for local anesthesia. Lumbar puncture was performed at the [L3-L4] level using a [20] gauge needle with return of [clear] CSF with an opening pressure of [38] cm water.  Closing pressure equal approximately 15 cm water.  [Nine] ml of CSF were obtained for laboratory studies. The patient tolerated the procedure well and there were no apparent complications.  IMPRESSION: [ 1. Successful lumbar puncture.  2. Opening pressure equal 38 cm water.

## 2020-07-26 NOTE — Procedures (Signed)
Cortrak  Person Inserting Tube:  Esaw Dace, RD Tube Type:  Cortrak - 43 inches Tube Location:  Left nare Initial Placement:  Stomach Secured by: Bridle Technique Used to Measure Tube Placement:  Documented cm marking at nare/ corner of mouth Cortrak Secured At:  65 cm Procedure Comments:  Cortrak Tube Team Note:  Consult received to place a Cortrak feeding tube.   No x-ray is required. RN may begin using tube.   If the tube becomes dislodged please keep the tube and contact the Cortrak team at [email or web address] ([email or web address]) for replacement.  If after hours and replacement cannot be delayed, place a NG tube and confirm placement with an abdominal x-ray.    Kerman Passey MS, RDN, LDN, CNSC Registered Dietitian III Clinical Nutrition RD Pager and On-Call Pager Number Located in [place]

## 2020-07-26 NOTE — Consult Note (Signed)
Cardiology Consultation:   Patient ID: Benjamin Burke MRN: [id number]; DOB: November 27, 1960  Admit date: 07/23/2020 Date of Consult: 07/26/2020  PCP:  Vicenta Aly, [place]  Cardiologist:  New to [place]; Dr. Acie Fredrickson Advanced Practice Provider:  No care team member to display Electrophysiologist:  None   Patient Profile:   Benjamin Burke is a 60 y.o. male with a PMH of HTN, HLD, COPD, cocaine abuse, tobacco abuse, chronic back pain, and squamous cell carcinoma of the scrotum, who was found unresponsive by family 07/26/20  who is being seen today for the evaluation of elevated HsTrop and CHF at the request of Dr. Lake Bells.  History of Present Illness:   Benjamin Burke was in her usual state of health until 07/27/2020 when he was found unresponsive by his family at home in his bed. He was last normal at 11pm the night before, then found unresponsive 07/23/2020. Per chart review he was noted to be using illicit drugs on the evening of 07/23/20. EMS was activated and he was given narcan without response. Upon arrival to the ED a code sepsis was activated. HSTrop [id number]. EKG showed sinus tachycardia with rate 112 bpm, poor R wave progression, no STE/D. He was started on broad spectrum IV antibiotics. He required intubation for airway protection in th ER. CT head showed hypo/hyperdensities in the superior L occipital lobe concerning for acute/subacute left PCA territory infarct with petechial hemorrhage and questionable hyperdense left PCA. MRI showed watershed infarctions bilaterally and a cerebellar infarction. Neurology consulted and suspect global hypoperfusion in the setting of sepsis and AKI, though cannot exclude cardioembolic source. An echo was obtained revealing EF 30-35%, global hypokinesis, G1DD, and no significant valvular abnormalities. He was recommended to undergo a TEE. Carotid dopplers with occluded right ECA on prelim read. He underwent a fluoroscopy  guided LP today to further evaluate etiology of his sepsis. He remains intubated and sedated. IV antibiotics narrowed to CTX. Cardiology asked to evaluate for elevated HsTrop, acute combined CHF, and possible TEE.  He has no prior cardiac history and has not seen a cardiologist in the past. He has had no prior cardiac work-up but underwent a CT Chest in 2017 which showed aortic atherosclerosis and 3-vessel coronary artery calcifications.   Sister, Benjamin Burke, was at bedside at the time of my evaluation. She tells me the day prior to admission, Benjamin Burke was helping paint a house. She was told that he had some complaints of chest discomfort earlier in the day and several episodes of vomiting. She is not aware of any prior complaints of chest pain. She reports he has chronic DOE which has been unchanged. She reports he has no prior cardiac history. She notes 2 brothers with MI's in their 72s. She is aware of his cocaine abuse but did not know he was still using recently and is unsure of the frequency of his use.      Past Medical History:  Diagnosis Date  . Chronic back pain   . Cocaine abuse ([place]) 08/09/2016   Patient caught using cocaine in his hospital room while an inpatient for spontaneous pneumothorax  . COPD (chronic obstructive pulmonary disease) ([place])   . Hypertension   . Poor dentition 09/27/2012  . Right spontaneous pneumothorax 08/07/2016  . Tobacco abuse 03/24/2012   Overview:  1 PPD x age 55.  Prior h/o smokeless tobacco but quit    Past Surgical History:  Procedure Laterality Date  . NO PAST  SURGERIES    . VIDEO ASSISTED THORACOSCOPY (VATS)/WEDGE RESECTION Right 02/27/2018   Procedure: VIDEO ASSISTED THORACOSCOPY (VATS)/WEDGE RESECTION;  Surgeon: Melrose Nakayama, MD;  Location: [facility] OR;  Service: Thoracic;  Laterality: Right;     Home Medications:  Prior to Admission medications   Medication Sig Start Date End Date Taking? Authorizing Provider  albuterol (VENTOLIN HFA) 108 (90  Base) MCG/ACT inhaler Inhale 2 puffs into the lungs every 6 (six) hours as needed for wheezing or shortness of breath.   Yes [provider]  aspirin EC 81 MG tablet Take 162 mg by mouth daily as needed (pain).   Yes [provider]  atorvastatin (LIPITOR) 20 MG tablet Take 1 tablet by mouth daily. 12/31/19  Yes [provider]  ipratropium-albuterol (DUONEB) 0.5-2.5 (3) MG/3ML SOLN Take 3 mLs by nebulization every 6 (six) hours as needed (wheezing, shortness of breath). 03/04/18  Yes Bonnielee Haff, MD  oxyCODONE (OXY IR/ROXICODONE) 5 MG immediate release tablet Take 1 tablet (5 mg total) by mouth every 6 (six) hours as needed for severe pain. Take 5 mg by mouth every 4-6 hours PRN severe pain 03/29/18  Yes Melrose Nakayama, MD    Inpatient Medications: Scheduled Meds: . [START ON 07/27/2020] atorvastatin  80 mg Per Tube Daily  . chlorhexidine gluconate (MEDLINE KIT)  15 mL Mouth Rinse BID  . Chlorhexidine Gluconate Cloth  6 each Topical Daily  . docusate  100 mg Per Tube BID  . famotidine  20 mg Per Tube BID  . heparin injection (subcutaneous)  5,000 Units Subcutaneous Q8H  . mouth rinse  15 mL Mouth Rinse 10 times per day  . polyethylene glycol  17 g Per Tube Daily   Continuous Infusions: . cefTRIAXone (ROCEPHIN)  IV 1 g (07/26/20 1012)  . dexmedetomidine (PRECEDEX) IV infusion 0.4 mcg/kg/hr (07/26/20 1447)  . feeding supplement (VITAL AF 1.2 CAL) Stopped (07/26/20 0554)  . fentaNYL infusion INTRAVENOUS 100 mcg/hr (07/26/20 0816)  . potassium PHOSPHATE IVPB (in mmol) 45 mmol (07/26/20 1317)   PRN Meds: acetaminophen (TYLENOL) oral liquid 160 mg/5 mL, fentaNYL (SUBLIMAZE) injection, fentaNYL (SUBLIMAZE) injection  Allergies:    Allergies  Allergen Reactions  . Tylenol [Acetaminophen] Nausea And Vomiting    Social History:   Social History   Socioeconomic History  . Marital status: Married    Spouse name: Not on file  . Number of children: Not on  file  . Years of education: Not on file  . Highest education level: Not on file  Occupational History  . Not on file  Tobacco Use  . Smoking status: Current Every Day Smoker    Packs/day: 1.50    Years: 45.00    Pack years: 67.50    Types: Cigarettes    Start date: 27  . Smokeless tobacco: Never Used  . Tobacco comment: smoked 1 1/2 packs for 10 years and 1 pack per day for 25 years - 40 pack year smoker   Substance and Sexual Activity  . Alcohol use: No    Alcohol/week: 0.0 standard drinks  . Drug use: Not on file  . Sexual activity: Not on file  Other Topics Concern  . Not on file  Social History Narrative  . Not on file   Social Determinants of Health   Financial Resource Strain: Not on file  Food Insecurity: Not on file  Transportation Needs: Not on file  Physical Activity: Not on file  Stress: Not on file  Social Connections: Not on  file  Intimate Partner Violence: Not on file    Family History:    Family History  Problem Relation Age of Onset  . Arthritis Mother   . Diabetes Mother   . Hyperlipidemia Mother   . Hypertension Mother   . Stroke Mother   . Kidney disease Mother   . Cancer Father      ROS:  Please see the history of present illness.   All other ROS reviewed and negative.     Physical Exam/Data:   Vitals:   07/26/20 1430 07/26/20 1500 07/26/20 1530 07/26/20 1555  BP: (!) 165/97 (!) 149/92 119/79   Pulse: 95 97 88 95  Resp: 19 12 (!) 22 (!) 23  Temp: 99.7 F (37.6 C) 100.2 F (37.9 C) 100.2 F (37.9 C) 100.04 F (37.8 C)  TempSrc:      SpO2: 99% 99% 98% 95%  Weight:      Height:        Intake/Output Summary (Last 24 hours) at 07/26/2020 1602 Last data filed at 07/26/2020 1414 Gross per 24 hour  Intake 2531.61 ml  Output 1105 ml  Net 1426.61 ml   Last 3 Weights 07/26/2020 07/25/2020 07/19/2020  Weight (lbs) 178 lb 12.7 oz 175 lb 7.8 oz 171 lb 4.8 oz  Weight (kg) 81.1 kg 79.6 kg 77.7 kg     Body mass index is 28 kg/m.   General:  Critically ill appearing man, intubated and sedated.  HEENT: sclera anicteric Neck: no JVD Vascular: No carotid bruits; distal pulses 2+ bilaterally  Cardiac:  normal S1, S2; RRR; no murmurs, rubs, or gallops Lungs:  clear to auscultation bilaterally, no wheezing, rhonchi or rales  Abd: soft, nontender, no hepatomegaly  Ext: no edema Musculoskeletal:  No deformities, BUE and BLE strength normal and equal Skin: warm and dry  Neuro:  Unable to assess Psych:  Unable to assess  EKG:  The EKG was personally reviewed and demonstrates:  sinus tachycardia with rate 112 bpm, poor R wave progression, no STE/D. Telemetry:  Telemetry was personally reviewed and demonstrates:  Sinus rhythm with a brief episode of NSVT and SVT  Relevant CV Studies: Echocardiogram 07/25/20: 1. Left ventricular ejection fraction, by estimation, is 30 to 35%. The  left ventricle has moderately decreased function. The left ventricle  demonstrates global hypokinesis. Left ventricular diastolic parameters are  consistent with Grade I diastolic  dysfunction (impaired relaxation).  2. Right ventricular systolic function is normal. The right ventricular  size is normal.  3. The mitral valve is normal in structure. No evidence of mitral valve  regurgitation. No evidence of mitral stenosis.  4. The aortic valve is normal in structure. Aortic valve regurgitation is  not visualized. No aortic stenosis is present.  5. The inferior vena cava is normal in size with greater than 50%  respiratory variability, suggesting right atrial pressure of 3 mmHg.   Laboratory Data:  High Sensitivity Troponin:   Recent Labs  Lab 07/21/2020 0937 07/09/2020 1247 07/22/2020 1515  TROPONINIHS 1,605* 6,484* 6,476*     Chemistry Recent Labs  Lab 07/17/2020 1247 07/25/20 0126 07/25/20 0610 07/26/20 0626  NA 140 141 140 139  K 5.1 4.5 4.2 3.7  CL 106 108  --  104  CO2 21* 24  --  26  GLUCOSE 148* 128*  --  117*  BUN 23* 24*   --  20  CREATININE 2.48* 1.68*  --  1.03  CALCIUM 7.8* 8.3*  --  8.2*  GFRNONAA 30*  51*  --  >60  ANIONGAP 13 9  --  9    Recent Labs  Lab 07/11/2020 0937 07/16/2020 1247  PROT 7.5 6.6  ALBUMIN 4.0 3.4*  AST 50* 74*  ALT 44 55*  ALKPHOS 88 77  BILITOT 0.6 0.9   Hematology Recent Labs  Lab 07/05/2020 1247 07/25/20 0126 07/25/20 0610 07/26/20 0626  WBC 20.8* 16.7*  --  14.9*  RBC 5.46 5.16  --  3.95*  HGB 16.2 15.2 13.9 12.1*  HCT 51.8 48.6 41.0 36.8*  MCV 94.9 94.2  --  93.2  MCH 29.7 29.5  --  30.6  MCHC 31.3 31.3  --  32.9  RDW 14.9 15.1  --  15.5  PLT 309 305  --  263   BNPNo results for input(s): BNP, PROBNP in the last 168 hours.  DDimer No results for input(s): DDIMER in the last 168 hours.   Radiology/Studies:  DG Chest 1 View  Result Date: 07/25/2020 CLINICAL DATA:  Aspiration. EXAM: CHEST  1 VIEW COMPARISON:  07/09/2020.  CT 12/03/2019. FINDINGS: Endotracheal tube and NG tube in stable position. Heart size normal. Postsurgical changes right upper lung. COPD. Low lung volumes. Persistent bilateral interstitial prominence, pneumonitis cannot be excluded. No pleural effusion or pneumothorax. IMPRESSION: 1. Lines and tubes in stable position. 2. COPD. Low lung volumes. Persistent bilateral interstitial prominence, pneumonitis cannot be excluded. Electronically Signed   By: Marcello Moores  Register   On: 07/25/2020 05:09   CT Head Wo Contrast  Result Date: 07/10/2020 CLINICAL DATA:  60 year old male with altered mental status, poorly responsive. EXAM: CT HEAD WITHOUT CONTRAST TECHNIQUE: Contiguous axial images were obtained from the base of the skull through the vertex without intravenous contrast. COMPARISON:  None. FINDINGS: Brain: Patchy bilateral fairly symmetric white matter hypodensity in both hemispheres. Confluent asymmetric mixed hypo and hyperdensity in the superior left occipital pole, series 3, image 14 and coronal image 63. No similar cortical hypodensity elsewhere.  Deep gray nuclei and posterior fossa gray-white differentiation remains within normal limits. No associated intracranial mass effect. No midline shift. No ventriculomegaly. No extra-axial hemorrhage identified. Vascular: Mild Calcified atherosclerosis at the skull base. Suggestion of fetal type left PCA origin. Questionable hyperdense left PCA P2 are P3 segment on series 3, image 15. No other suspicious intracranial vascular hyperdensity. Skull: Negative. Sinuses/Orbits: Right nasal airway in place. Small volume retained secretions in the nasopharynx. Mild mucosal thickening and bubbly opacity in the left ethmoid and maxillary sinuses. Tympanic cavities and mastoids are clear. Other: Visualized orbits and scalp soft tissues are within normal limits. IMPRESSION: 1. Abnormal mixed hypo- and hyperdensity in the superior left occipital lobe encompassing roughly 3 cm. [name] this is an acute or subacute Left PCA territory infarct with petechial hemorrhage (questionable hyperdense left PCA) - but recommend Head MRI (without contrast may suffice) to confirm. 2. No significant intracranial mass effect. Superimposed age advanced bilateral white matter changes, most commonly due to chronic small vessel disease. 3. Right nasal airway in place. Mild left paranasal sinus inflammation. Electronically Signed   By: Genevie Ann M.D.   On: 07/06/2020 10:07   MR ANGIO HEAD WO CONTRAST  Result Date: 07/12/2020 CLINICAL DATA:  Acute stroke presentation. Altered mental status. Left PCA territory infarction by head CT. EXAM: MRI HEAD WITHOUT CONTRAST MRA HEAD WITHOUT CONTRAST TECHNIQUE: Multiplanar, multiecho pulse sequences of the brain and surrounding structures were obtained without intravenous contrast. Angiographic images of the head were obtained using MRA technique without contrast. COMPARISON:  Head  CT earlier same day. FINDINGS: MRI HEAD FINDINGS Brain: No acute infarction affects the brainstem or cerebellum. 3 cm acute infarction  at the left parietooccipital junction on the left. Watershed pattern infarctions extending front to back affecting both hemispheres at the watershed regions. Cortical and white matter involvement as is typical. Mild chronic small-vessel ischemic change of the cerebral hemispheric white matter otherwise is suspected. No evidence of mass, frank hematoma, hydrocephalus or extra-axial collection. Minor petechial blood products present in the left parietooccipital junction region infarction. Vascular: Major vessels at the base of the brain show flow. Skull and upper cervical spine: Negative Sinuses/Orbits: Mucosal inflammatory changes of the paranasal sinuses. Orbits are negative. Other: None MRA HEAD FINDINGS Both internal carotid arteries are widely patent through the skull base and siphon regions. The anterior and middle cerebral vessels are patent. 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 Fetal origin of the left PCA from the anterior circulation. Dominant left vertebral artery widely patent to the basilar. Non dominant right vertebral artery shows atherosclerotic narrowing and irregularity with does give flow to the basilar. The basilar artery shows atherosclerotic irregularity but no stenosis. Superior cerebellar and posterior cerebral arteries show flow. As noted above, left PCA arises from the anterior circulation. IMPRESSION: 1. Watershed pattern infarctions extending front to back affecting both hemispheres at the watershed regions. 3 cm acute infarction at the left parietooccipital junction was visible at CT mild petechial blood products in the left parietooccipital infarction region. No evidence of frank hematoma or mass effect. Findings most likely secondary to global hypoperfusion event. 2. Background pattern of mild chronic small-vessel ischemic change of the cerebral hemispheric white matter elsewhere. 3. MR angiography does not show any large or medium vessel occlusion. There is atherosclerotic  irregularity of the non dominant right vertebral artery and the basilar artery but no significant stenosis. 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 Fetal origin of the left PCA from the anterior circulation. Electronically Signed   By: Nelson Chimes M.D.   On: 07/04/2020 18:00   MR BRAIN WO CONTRAST  Result Date: 07/26/2020 CLINICAL DATA:  Acute stroke presentation. Altered mental status. Left PCA territory infarction by head CT. EXAM: MRI HEAD WITHOUT CONTRAST MRA HEAD WITHOUT CONTRAST TECHNIQUE: Multiplanar, multiecho pulse sequences of the brain and surrounding structures were obtained without intravenous contrast. Angiographic images of the head were obtained using MRA technique without contrast. COMPARISON:  Head CT earlier same day. FINDINGS: MRI HEAD FINDINGS Brain: No acute infarction affects the brainstem or cerebellum. 3 cm acute infarction at the left parietooccipital junction on the left. Watershed pattern infarctions extending front to back affecting both hemispheres at the watershed regions. Cortical and white matter involvement as is typical. Mild chronic small-vessel ischemic change of the cerebral hemispheric white matter otherwise is suspected. No evidence of mass, frank hematoma, hydrocephalus or extra-axial collection. Minor petechial blood products present in the left parietooccipital junction region infarction. Vascular: Major vessels at the base of the brain show flow. Skull and upper cervical spine: Negative Sinuses/Orbits: Mucosal inflammatory changes of the paranasal sinuses. Orbits are negative. Other: None MRA HEAD FINDINGS Both internal carotid arteries are widely patent through the skull base and siphon regions. The anterior and middle cerebral vessels are patent. 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 Fetal origin of the left PCA from the anterior circulation. Dominant left vertebral artery widely patent to the basilar. Non dominant right  vertebral artery shows atherosclerotic narrowing and irregularity with does give flow to the  basilar. The basilar artery shows atherosclerotic irregularity but no stenosis. Superior cerebellar and posterior cerebral arteries show flow. As noted above, left PCA arises from the anterior circulation. IMPRESSION: 1. Watershed pattern infarctions extending front to back affecting both hemispheres at the watershed regions. 3 cm acute infarction at the left parietooccipital junction was visible at CT mild petechial blood products in the left parietooccipital infarction region. No evidence of frank hematoma or mass effect. Findings most likely secondary to global hypoperfusion event. 2. Background pattern of mild chronic small-vessel ischemic change of the cerebral hemispheric white matter elsewhere. 3. MR angiography does not show any large or medium vessel occlusion. There is atherosclerotic irregularity of the non dominant right vertebral artery and the basilar artery but no significant stenosis. 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 Fetal origin of the left PCA from the anterior circulation. Electronically Signed   By: Nelson Chimes M.D.   On: 07/19/2020 18:00   DG Chest Portable 1 View  Result Date: 07/23/2020 CLINICAL DATA:  Intubation EXAM: PORTABLE CHEST 1 VIEW COMPARISON:  07/23/2020 at 0948 hours FINDINGS: Interval placement of endotracheal tube distal tip terminating approximately 5.7 cm above the carina. Interval placement of enteric tube with distal tip extending beyond the inferior margin of the film. Stable cardiomediastinal contours. Mild pulmonary vascular congestion. Hazy perihilar and bibasilar interstitial opacities, right slightly worse than left. No pleural effusion. No pneumothorax. Postsurgical changes and scarring within the right upper lobe. IMPRESSION: 1. Satisfactory endotracheal tube and enteric tube positioning. 2. Hazy perihilar and bibasilar interstitial  opacities, right slightly worse than left, could reflect edema versus infection. Electronically Signed   By: Davina Poke D.O.   On: 07/26/2020 11:35   DG Chest Port 1 View  Result Date: 07/16/2020 CLINICAL DATA:  Altered level of consciousness. EXAM: PORTABLE CHEST 1 VIEW COMPARISON:  June 21, 2018. FINDINGS: The heart size and mediastinal contours are within normal limits. No pneumothorax or pleural effusion is noted. Stable postsurgical changes and scarring are noted in right upper lobe. No acute abnormality is noted. The visualized skeletal structures are unremarkable. IMPRESSION: No active disease. Electronically Signed   By: Marijo Conception M.D.   On: 07/08/2020 10:14   ECHOCARDIOGRAM COMPLETE  Result Date: 07/25/2020    ECHOCARDIOGRAM REPORT   Patient Name:   BIRDIE BEVERIDGE [facility] Date of Exam: 07/25/2020 Medical Rec #:  [id number]       Height:       67.0 in Accession #:    [id number]      Weight:       175.5 lb Date of Birth:  1960/07/07        BSA:          1.913 m Patient Age:    28 years        BP:           126/91 mmHg Patient Gender: M               HR:           89 bpm. Exam Location:  Inpatient Procedure: 2D Echo, Cardiac Doppler, Color Doppler and Intracardiac            Opacification Agent Indications:    NSTEMI  History:        Patient has no prior history of Echocardiogram examinations.                 COPD; Risk Factors:Hypertension.  Sonographer:  Bernadene Person RDCS Referring Phys: [id number] Candee Furbish  Sonographer Comments: Suboptimal apical window. Image acquisition challenging due to respiratory motion and Image acquisition challenging due to COPD. IMPRESSIONS  1. Left ventricular ejection fraction, by estimation, is 30 to 35%. The left ventricle has moderately decreased function. The left ventricle demonstrates global hypokinesis. Left ventricular diastolic parameters are consistent with Grade I diastolic dysfunction (impaired relaxation).  2. Right ventricular systolic function  is normal. The right ventricular size is normal.  3. The mitral valve is normal in structure. No evidence of mitral valve regurgitation. No evidence of mitral stenosis.  4. The aortic valve is normal in structure. Aortic valve regurgitation is not visualized. No aortic stenosis is present.  5. The inferior vena cava is normal in size with greater than 50% respiratory variability, suggesting right atrial pressure of 3 mmHg. FINDINGS  Left Ventricle: Left ventricular ejection fraction, by estimation, is 30 to 35%. The left ventricle has moderately decreased function. The left ventricle demonstrates global hypokinesis. Definity contrast agent was given IV to delineate the left ventricular endocardial borders. The left ventricular internal cavity size was normal in size. There is no left ventricular hypertrophy. Left ventricular diastolic parameters are consistent with Grade I diastolic dysfunction (impaired relaxation). Right Ventricle: The right ventricular size is normal. No increase in right ventricular wall thickness. Right ventricular systolic function is normal. Left Atrium: Left atrial size was normal in size. Right Atrium: Right atrial size was normal in size. Pericardium: There is no evidence of pericardial effusion. Mitral Valve: The mitral valve is normal in structure. No evidence of mitral valve regurgitation. No evidence of mitral valve stenosis. Tricuspid Valve: The tricuspid valve is normal in structure. Tricuspid valve regurgitation is not demonstrated. No evidence of tricuspid stenosis. Aortic Valve: The aortic valve is normal in structure. Aortic valve regurgitation is not visualized. No aortic stenosis is present. Pulmonic Valve: The pulmonic valve was normal in structure. Pulmonic valve regurgitation is not visualized. No evidence of pulmonic stenosis. Aorta: The aortic root is normal in size and structure. Venous: The inferior vena cava is normal in size with greater than 50% respiratory  variability, suggesting right atrial pressure of 3 mmHg. IAS/Shunts: No atrial level shunt detected by color flow Doppler.  LEFT VENTRICLE PLAX 2D LVIDd:         4.80 cm      Diastology LVIDs:         3.80 cm      LV e' medial:    5.19 cm/s LV PW:         0.60 cm      LV E/e' medial:  11.8 LV IVS:        0.70 cm      LV e' lateral:   5.34 cm/s LVOT diam:     1.90 cm      LV E/e' lateral: 11.5 LV SV:         48 LV SV Index:   25 LVOT Area:     2.84 cm  LV Volumes (MOD) LV vol d, MOD A4C: 135.0 ml LV vol s, MOD A4C: 93.1 ml LV SV MOD A4C:     135.0 ml RIGHT VENTRICLE RV S prime:     10.40 cm/s TAPSE (M-mode): 1.9 cm LEFT ATRIUM             Index       RIGHT ATRIUM           Index LA diam:  3.10 cm 1.62 cm/m  RA Area:     15.10 cm LA Vol (A2C):   26.4 ml 13.80 ml/m RA Volume:   41.50 ml  21.69 ml/m LA Vol (A4C):   25.2 ml 13.17 ml/m LA Biplane Vol: 27.5 ml 14.38 ml/m  AORTIC VALVE LVOT Vmax:   116.00 cm/s LVOT Vmean:  85.600 cm/s LVOT VTI:    0.169 m  AORTA Ao Root diam: 2.90 cm Ao Asc diam:  3.00 cm MITRAL VALVE MV Area (PHT): 2.36 cm    SHUNTS MV Decel Time: 322 msec    Systemic VTI:  0.17 m MV E velocity: 61.30 cm/s  Systemic Diam: 1.90 cm MV A velocity: 80.60 cm/s MV E/A ratio:  0.76 Candee Furbish MD Electronically signed by Candee Furbish MD Signature Date/Time: 07/25/2020/2:06:12 PM    Final    VAS US CAROTID (at [facility] and [facility] only)  Result Date: 07/25/2020 Carotid Arterial Duplex Study Indications:       CVA. Risk Factors:      Hypertension. Comparison Study:  no prior Performing Technologist: Abram Sander RVS  Examination Guidelines: A complete evaluation includes B-mode imaging, spectral Doppler, color Doppler, and power Doppler as needed of all accessible portions of each vessel. Bilateral testing is considered an integral part of a complete examination. Limited examinations for reoccurring indications may be performed as noted.  Right Carotid Findings:  +----------+-------+-------+--------+---------------------------------+--------+           PSV    EDV    StenosisPlaque Description               Comments           cm/s   cm/s                                                     +----------+-------+-------+--------+---------------------------------+--------+ CCA Prox  49     19             heterogenous                              +----------+-------+-------+--------+---------------------------------+--------+ CCA Distal32     13             heterogenous, calcific and                                                irregular                                 +----------+-------+-------+--------+---------------------------------+--------+ ICA Prox  92     35     1-39%   heterogenous, irregular and                                               calcific                                  +----------+-------+-------+--------+---------------------------------+--------+ ICA Distal91  37                                                       +----------+-------+-------+--------+---------------------------------+--------+ ECA                     Occluded                                          +----------+-------+-------+--------+---------------------------------+--------+ +----------+--------+-------+--------+-------------------+           PSV cm/sEDV cmsDescribeArm Pressure (mmHG) +----------+--------+-------+--------+-------------------+ [id number]                                         +----------+--------+-------+--------+-------------------+ +---------+--------+--+--------+--+---------+ VertebralPSV cm/s69EDV cm/s16Antegrade +---------+--------+--+--------+--+---------+  Left Carotid Findings: +----------+--------+--------+--------+-------------------------+---------+           PSV cm/sEDV cm/sStenosisPlaque Description       Comments   +----------+--------+--------+--------+-------------------------+---------+ CCA Prox  78      22              heterogenous                       +----------+--------+--------+--------+-------------------------+---------+ CCA Distal57      17              heterogenous                       +----------+--------+--------+--------+-------------------------+---------+ ICA Prox  80      30      1-39%   heterogenous and calcificShadowing +----------+--------+--------+--------+-------------------------+---------+ ICA Distal101     48                                                 +----------+--------+--------+--------+-------------------------+---------+ ECA       91      23                                                 +----------+--------+--------+--------+-------------------------+---------+ +----------+--------+--------+--------+-------------------+           PSV cm/sEDV cm/sDescribeArm Pressure (mmHG) +----------+--------+--------+--------+-------------------+ [id number]                                          +----------+--------+--------+--------+-------------------+ +---------+--------+--+--------+--+---------+ VertebralPSV cm/s42EDV cm/s15Antegrade +---------+--------+--+--------+--+---------+   Summary: Right Carotid: Velocities in the right ICA are consistent with a 1-39% stenosis.                The ECA appears occluded. Left Carotid: Velocities in the left ICA are consistent with a 1-39% stenosis. Vertebrals: Bilateral vertebral arteries demonstrate antegrade flow. *See table(s) above for measurements and observations.     Preliminary    DG FL GUIDED LUMBAR PUNCTURE  Result Date: 07/26/2020 CLINICAL DATA:  Altered mental status. Watershed infarction on MRI.  Intubated patient. EXAM: DIAGNOSTIC LUMBAR PUNCTURE UNDER FLUOROSCOPIC GUIDANCE FLUOROSCOPY TIME:  Fluoroscopy Time:  48 seconds Radiation Exposure Index (if provided by the fluoroscopic  device): 12.9 mGy Number of Acquired Spot Images: 1 PROCEDURE: Informed consent was obtained from the patient prior to the procedure, including potential complications of headache, allergy, and pain. With the patient prone, the lower back was prepped with Betadine. 1% Lidocaine was used for local anesthesia. Lumbar puncture was performed at the L3-L4 level using a 20 gauge needle with return of clear CSF with an opening pressure of 38 cm water. Closing pressure equal approximately 15 cm water. Nine ml of CSF were obtained for laboratory studies. The patient tolerated the procedure well and there were no apparent complications. IMPRESSION: 1. Successful lumbar puncture. 2. Opening pressure equal 38 cm water. Electronically Signed   By: Suzy Bouchard M.D.   On: 07/26/2020 11:28     Assessment and Plan:   1. Acute combined CHF: patient presented after being found unresponsive the morning of 07/27/2020 with reported cocaine use the night before. He has a longstanding history of polysubstance abuse. He has not followed with a cardiologist in the past. Echo this admission showed EF 30-35%, global hypokinesis, G1DD, and no significant valvular abnormalities. Does not appear to have had a prior ischemic evaluation, however a CT Chest in 2021 showed aortic atherosclerosis with 3-vessel coronary artery calcifications. HsTrops were 2440>1027>2536. Cannot exclude ischemic etiology, though differential also includes cocaine induced cardiomyopathy.  - Will check a TSH tomorrow - Will avoid BBlocker for now given recent cocaine abuse - Will start low dose losartan now. Can consider transition to entresto if BP/Cr remain stable. - Can consider addition of spironolactone if Cr/BP remain stable  - Continue to monitor strict I&Os and daily weights - he is up 3L /7lbs this admission - Continue to monitor electrolytes and replete as needed to maintain K >4, Mg >2  2. Elevated troponins in patient with coronary artery  calcifications: no prior cardiac history of ischemic evaluations. LDL 60 and A1C 6.1 on labs this admission. Risk factors for obstructive CAD include 3-vessel coronary artery calcifications on CT Chest in 2021, HTN, HLD, and tobacco abuse. Possible rise in trop was related to coronary artery vasospasm in the setting of recent cocaine abuse.  - Favor medication management at this time.  - Continue statin - Would start aspirin if cleared to do so by neurology - If patient makes a meaningful recovery and proves compliance, would be reasonable to conduct an ischemic evaluation down the road.   3. CVA: patient presented after being found unresponsive. MRI with watershed infarct though to be 2/2 sepsis and AKI, though cannot exclude cardioembolic source. TTE negative for thrombus. Recommended to undergo TEE. No evidence of atrial fibrillation on echocardiogram.  - Will determine timing of TEE pending extubation - if he remains intubated on Monday, would consider bedside TEE. -  After careful review of history and examination, the risks and benefits of transesophageal echocardiogram have been explained including risks of esophageal damage, perforation (1:10,000 risk), bleeding, pharyngeal hematoma as well as other potential complications associated with conscious sedation including aspiration, arrhythmia, respiratory failure and death. Alternatives to treatment were discussed with Benjamin Burke, questions were answered. Benjamin Burke, [place], is willing to proceed.    4. AKI: Cr up to 2.76 on admission, now back down to baseline 1.03 after IVF resuscitation.  - Continue to monitor   5. Cocaine abuse: family reported use the night of 07/23/20. Utox positive  for cocaine and marijuana. Ongoing use will not only contribute to worsening of his cardiomyopathy, but all will limit management - Continue to encourage cessation.  6. Tobacco abuse:  - Continue to encourage cessation   Risk Assessment/Risk Scores:   For  questions or updates, please contact [place] Please consult [email or web address] for contact info under    Signed, Abigail Butts, PA-C  07/26/2020 4:02 PM

## 2020-07-26 NOTE — Progress Notes (Signed)
STROKE TEAM PROGRESS NOTE   INTERVAL HISTORY No family is at the bedside.  Pt still intubated, on sedation with propofol and Precedex. Still has fever but improved leukocytosis.  Had an LP today, CSF not consistent with CNS infection.  Continue Rocephin.  Pending TEE next week.   OBJECTIVE Vitals:   07/26/20 1800 07/26/20 1830 07/26/20 1900 07/26/20 1930  BP: 123/84 133/83 100/75 102/75  Pulse: 79 88 84 79  Resp: 16 19 (!) 22 (!) 22  Temp: 100.22 F (37.9 C) (!) 100.58 F (38.1 C) (!) 101.12 F (38.4 C) (!) 101.3 F (38.5 C)  TempSrc:    Bladder  SpO2: 100% 98% 100% 100%  Weight:      Height:        CBC:  Recent Labs  Lab 07/26/2020 0937 07/23/2020 1012 07/25/20 0126 07/25/20 0610 07/26/20 0626  WBC 15.6*   < > 16.7*  --  14.9*  NEUTROABS 12.6*  --   --   --   --   HGB 17.0   < > 15.2 13.9 12.1*  HCT 51.5   < > 48.6 41.0 36.8*  MCV 92.8   < > 94.2  --  93.2  PLT 365   < > 305  --  263   < > = values in this interval not displayed.    Basic Metabolic Panel:  Recent Labs  Lab 07/25/20 0126 07/25/20 0610 07/25/20 1629 07/26/20 0509 07/26/20 0626 07/26/20 1621  NA 141 140  --   --  139  --   K 4.5 4.2  --   --  3.7  --   CL 108  --   --   --  104  --   CO2 24  --   --   --  26  --   GLUCOSE 128*  --   --   --  117*  --   BUN 24*  --   --   --  20  --   CREATININE 1.68*  --   --   --  1.03  --   CALCIUM 8.3*  --   --   --  8.2*  --   MG 2.7*  --    < > 2.2  --  2.0  PHOS 2.2*  --    < > 1.6*  --  2.9   < > = values in this interval not displayed.    Lipid Panel:     Component Value Date/Time   CHOL 116 07/25/2020 0126   TRIG 96 07/25/2020 0126   TRIG 99 07/25/2020 0126   HDL 36 (L) 07/25/2020 0126   CHOLHDL 3.2 07/25/2020 0126   VLDL 20 07/25/2020 0126   LDLCALC 60 07/25/2020 0126   HgbA1c:  Lab Results  Component Value Date   HGBA1C 6.1 (H) 07/25/2020   Urine Drug Screen:     Component Value Date/Time   LABOPIA NONE DETECTED 07/06/2020 1025    COCAINSCRNUR POSITIVE (A) 07/23/2020 1025   LABBENZ NONE DETECTED 07/08/2020 1025   AMPHETMU NONE DETECTED 07/11/2020 1025   THCU POSITIVE (A) 07/14/2020 1025   LABBARB NONE DETECTED 07/12/2020 1025    Alcohol Level     Component Value Date/Time   ETH <10 07/13/2020 0939    IMAGING   DG Chest 1 View  Result Date: 07/25/2020 CLINICAL DATA:  Aspiration. EXAM: CHEST  1 VIEW COMPARISON:  07/06/2020.  CT 12/03/2019. FINDINGS: Endotracheal tube and NG tube in stable position. Heart  size normal. Postsurgical changes right upper lung. COPD. Low lung volumes. Persistent bilateral interstitial prominence, pneumonitis cannot be excluded. No pleural effusion or pneumothorax. IMPRESSION: 1. Lines and tubes in stable position. 2. COPD. Low lung volumes. Persistent bilateral interstitial prominence, pneumonitis cannot be excluded. Electronically Signed   By: Marcello Moores  Register   On: 07/25/2020 05:09   ECHOCARDIOGRAM COMPLETE  Result Date: 07/25/2020    ECHOCARDIOGRAM REPORT   Patient Name:   Benjamin Burke [facility] Date of Exam: 07/25/2020 Medical Rec #:  [id number]       Height:       67.0 in Accession #:    [id number]      Weight:       175.5 lb Date of Birth:  [birth year]        BSA:          1.913 m Patient Age:    60 years        BP:           126/91 mmHg Patient Gender: M               HR:           89 bpm. Exam Location:  Inpatient Procedure: 2D Echo, Cardiac Doppler, Color Doppler and Intracardiac            Opacification Agent Indications:    NSTEMI  History:        Patient has no prior history of Echocardiogram examinations.                 COPD; Risk Factors:Hypertension.  Sonographer:    Bernadene Person RDCS Referring Phys: [id number] Candee Furbish  Sonographer Comments: Suboptimal apical window. Image acquisition challenging due to respiratory motion and Image acquisition challenging due to COPD. IMPRESSIONS  1. Left ventricular ejection fraction, by estimation, is 30 to 35%. The left ventricle has moderately  decreased function. The left ventricle demonstrates global hypokinesis. Left ventricular diastolic parameters are consistent with Grade I diastolic dysfunction (impaired relaxation).  2. Right ventricular systolic function is normal. The right ventricular size is normal.  3. The mitral valve is normal in structure. No evidence of mitral valve regurgitation. No evidence of mitral stenosis.  4. The aortic valve is normal in structure. Aortic valve regurgitation is not visualized. No aortic stenosis is present.  5. The inferior vena cava is normal in size with greater than 50% respiratory variability, suggesting right atrial pressure of 3 mmHg. FINDINGS  Left Ventricle: Left ventricular ejection fraction, by estimation, is 30 to 35%. The left ventricle has moderately decreased function. The left ventricle demonstrates global hypokinesis. Definity contrast agent was given IV to delineate the left ventricular endocardial borders. The left ventricular internal cavity size was normal in size. There is no left ventricular hypertrophy. Left ventricular diastolic parameters are consistent with Grade I diastolic dysfunction (impaired relaxation). Right Ventricle: The right ventricular size is normal. No increase in right ventricular wall thickness. Right ventricular systolic function is normal. Left Atrium: Left atrial size was normal in size. Right Atrium: Right atrial size was normal in size. Pericardium: There is no evidence of pericardial effusion. Mitral Valve: The mitral valve is normal in structure. No evidence of mitral valve regurgitation. No evidence of mitral valve stenosis. Tricuspid Valve: The tricuspid valve is normal in structure. Tricuspid valve regurgitation is not demonstrated. No evidence of tricuspid stenosis. Aortic Valve: The aortic valve is normal in structure. Aortic valve regurgitation is not visualized. No aortic stenosis is present.  Pulmonic Valve: The pulmonic valve was normal in structure. Pulmonic  valve regurgitation is not visualized. No evidence of pulmonic stenosis. Aorta: The aortic root is normal in size and structure. Venous: The inferior vena cava is normal in size with greater than 50% respiratory variability, suggesting right atrial pressure of 3 mmHg. IAS/Shunts: No atrial level shunt detected by color flow Doppler.  LEFT VENTRICLE PLAX 2D LVIDd:         4.80 cm      Diastology LVIDs:         3.80 cm      LV e' medial:    5.19 cm/s LV PW:         0.60 cm      LV E/e' medial:  11.8 LV IVS:        0.70 cm      LV e' lateral:   5.34 cm/s LVOT diam:     1.90 cm      LV E/e' lateral: 11.5 LV SV:         48 LV SV Index:   25 LVOT Area:     2.84 cm  LV Volumes (MOD) LV vol d, MOD A4C: 135.0 ml LV vol s, MOD A4C: 93.1 ml LV SV MOD A4C:     135.0 ml RIGHT VENTRICLE RV S prime:     10.40 cm/s TAPSE (M-mode): 1.9 cm LEFT ATRIUM             Index       RIGHT ATRIUM           Index LA diam:        3.10 cm 1.62 cm/m  RA Area:     15.10 cm LA Vol (A2C):   26.4 ml 13.80 ml/m RA Volume:   41.50 ml  21.69 ml/m LA Vol (A4C):   25.2 ml 13.17 ml/m LA Biplane Vol: 27.5 ml 14.38 ml/m  AORTIC VALVE LVOT Vmax:   116.00 cm/s LVOT Vmean:  85.600 cm/s LVOT VTI:    0.169 m  AORTA Ao Root diam: 2.90 cm Ao Asc diam:  3.00 cm MITRAL VALVE MV Area (PHT): 2.36 cm    SHUNTS MV Decel Time: 322 msec    Systemic VTI:  0.17 m MV E velocity: 61.30 cm/s  Systemic Diam: 1.90 cm MV A velocity: 80.60 cm/s MV E/A ratio:  0.76 Candee Furbish MD Electronically signed by Candee Furbish MD Signature Date/Time: 07/25/2020/2:06:12 PM    Final    VAS US CAROTID (at [facility] and [facility] only)  Result Date: 07/25/2020 Carotid Arterial Duplex Study Indications:       CVA. Risk Factors:      Hypertension. Comparison Study:  no prior Performing Technologist: Abram Sander RVS  Examination Guidelines: A complete evaluation includes B-mode imaging, spectral Doppler, color Doppler, and power Doppler as needed of all accessible portions of each vessel. Bilateral  testing is considered an integral part of a complete examination. Limited examinations for reoccurring indications may be performed as noted.  Right Carotid Findings: +----------+-------+-------+--------+---------------------------------+--------+           PSV    EDV    StenosisPlaque Description               Comments           cm/s   cm/s                                                     +----------+-------+-------+--------+---------------------------------+--------+  CCA Prox  49     19             heterogenous                              +----------+-------+-------+--------+---------------------------------+--------+ CCA Distal32     13             heterogenous, calcific and                                                irregular                                 +----------+-------+-------+--------+---------------------------------+--------+ ICA Prox  92     35     1-39%   heterogenous, irregular and                                               calcific                                  +----------+-------+-------+--------+---------------------------------+--------+ ICA Distal91     37                                                       +----------+-------+-------+--------+---------------------------------+--------+ ECA                     Occluded                                          +----------+-------+-------+--------+---------------------------------+--------+ +----------+--------+-------+--------+-------------------+           PSV cm/sEDV cmsDescribeArm Pressure (mmHG) +----------+--------+-------+--------+-------------------+ [id number]                                         +----------+--------+-------+--------+-------------------+ +---------+--------+--+--------+--+---------+ VertebralPSV cm/s69EDV cm/s16Antegrade +---------+--------+--+--------+--+---------+  Left Carotid Findings:  +----------+--------+--------+--------+-------------------------+---------+           PSV cm/sEDV cm/sStenosisPlaque Description       Comments  +----------+--------+--------+--------+-------------------------+---------+ CCA Prox  78      22              heterogenous                       +----------+--------+--------+--------+-------------------------+---------+ CCA Distal57      17              heterogenous                       +----------+--------+--------+--------+-------------------------+---------+ ICA Prox  80      30      1-39%   heterogenous and calcificShadowing +----------+--------+--------+--------+-------------------------+---------+ ICA Distal101     48                                                 +----------+--------+--------+--------+-------------------------+---------+  ECA       91      23                                                 +----------+--------+--------+--------+-------------------------+---------+ +----------+--------+--------+--------+-------------------+           PSV cm/sEDV cm/sDescribeArm Pressure (mmHG) +----------+--------+--------+--------+-------------------+ [id number]                                          +----------+--------+--------+--------+-------------------+ +---------+--------+--+--------+--+---------+ VertebralPSV cm/s42EDV cm/s15Antegrade +---------+--------+--+--------+--+---------+   Summary: Right Carotid: Velocities in the right ICA are consistent with a 1-39% stenosis.                The ECA appears occluded. Left Carotid: Velocities in the left ICA are consistent with a 1-39% stenosis. Vertebrals: Bilateral vertebral arteries demonstrate antegrade flow. *See table(s) above for measurements and observations.     Preliminary    DG FL GUIDED LUMBAR PUNCTURE  Result Date: 07/26/2020 CLINICAL DATA:  Altered mental status. Watershed infarction on MRI. Intubated patient. EXAM: DIAGNOSTIC LUMBAR  PUNCTURE UNDER FLUOROSCOPIC GUIDANCE FLUOROSCOPY TIME:  Fluoroscopy Time:  48 seconds Radiation Exposure Index (if provided by the fluoroscopic device): 12.9 mGy Number of Acquired Spot Images: 1 PROCEDURE: Informed consent was obtained from the patient prior to the procedure, including potential complications of headache, allergy, and pain. With the patient prone, the lower back was prepped with Betadine. 1% Lidocaine was used for local anesthesia. Lumbar puncture was performed at the L3-L4 level using a 20 gauge needle with return of clear CSF with an opening pressure of 38 cm water. Closing pressure equal approximately 15 cm water. Nine ml of CSF were obtained for laboratory studies. The patient tolerated the procedure well and there were no apparent complications. IMPRESSION: 1. Successful lumbar puncture. 2. Opening pressure equal 38 cm water. Electronically Signed   By: Suzy Bouchard M.D.   On: 07/26/2020 11:28   Transthoracic Echocardiogram  1. Left ventricular ejection fraction, by estimation, is 30 to 35%. The  left ventricle has moderately decreased function. The left ventricle  demonstrates global hypokinesis. Left ventricular diastolic parameters are  consistent with Grade I diastolic  dysfunction (impaired relaxation).  2. Right ventricular systolic function is normal. The right ventricular  size is normal.  3. The mitral valve is normal in structure. No evidence of mitral valve  regurgitation. No evidence of mitral stenosis.  4. The aortic valve is normal in structure. Aortic valve regurgitation is  not visualized. No aortic stenosis is present.  5. The inferior vena cava is normal in size with greater than 50%  respiratory variability, suggesting right atrial pressure of 3 mmHg.   Bilateral Carotid Dopplers  Right Carotid: Velocities in the right ICA are consistent with a 1-39%  stenosis. The ECA appears occluded.  Left Carotid: Velocities in the left ICA are consistent  with a 1-39%  stenosis.  Vertebrals: Bilateral vertebral arteries demonstrate antegrade flow.   ECG - ST rate 112 BPM. (See cardiology reading for complete details)   PHYSICAL EXAM  Temp:  [98.42 F (36.9 C)-101.3 F (38.5 C)] 101.3 F (38.5 C) (02/25 1930) Pulse Rate:  [74-97] 79 (02/25 1930) Resp:  [12-26] 22 (02/25 1930) BP: (100-165)/(68-97) 102/75 (02/25 1930) SpO2:  [95 %-  100 %] 100 % (02/25 1930) FiO2 (%):  [40 %] 40 % (02/25 1555) Weight:  [81.1 kg] 81.1 kg (02/25 0500)  General - Well nourished, well developed, intubated on sedation.  Ophthalmologic - fundi not visualized due to noncooperation.  Cardiovascular - Regular rhythm with tachycardia.  Neuro - intubated on fentanyl and Precedex, eyes closed, not open with voice, not following commands. With forced eye opening, eyes in mid position, not blinking to visual threat, doll's eyes present, not tracking, PERRL. Corneal reflex weakly present bilaterally, gag and cough present. Breathing over the vent.  Facial symmetry not able to test due to ET tube.  Tongue protrusion not cooperative. On pain stimulation, no movement in all extremities. DTR diminished and no babinski. Sensation, coordination and gait not tested.    ASSESSMENT/PLAN [name] is a 60 y.o. male with history of cocaine abuse, chronic back pain, hypertension, COPD and tobacco abuse, brought into the emergency room after he was  found down by family.  He did not receive IV t-PA due to late presentation (>4.5 hours from time of onset).  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 However, cardioembolic source can not be ruled out, need TEE to rule out LV thrombus or endocarditis  CT head - Abnormal mixed hypo- and hyperdensity in the superior left occipital lobe encompassing roughly 3 cm. [name] this is an acute or subacute Left PCA territory  infarct with petechial hemorrhage (questionable hyperdense left PCA)   MRI head - Watershed pattern infarctions extending front to back affecting both hemispheres at the watershed regions. 3 cm acute infarction at the left parietooccipital junction was visible at CT mild petechial blood products in the left parietooccipital infarction region.   MRA head - unremarkable, nondominant right VA with distal occlusion  Carotid Doppler - unremarkable  2D Echo - EF 30-35% but no vegetation  Recommend TEE to rule out LV thrombus or endocarditis  Lacey Jensen Virus 2 - negative  LDL - 60  HgbA1c - 6.1  UDS - Cocaine and THCU  VTE prophylaxis - heparin IV  aspirin 81 mg daily prior to admission, now on No antithrombotic given concerns of endocarditis  Ongoing aggressive stroke risk factor management  Therapy recommendations:  pending  Disposition:  Pending  Sepsis Fever  Leukocytosis  Tmax 100.8->101.3  Leukocytosis - WBC's - 15.6->20.8->16.7->14.9  Blood cultures NGTD  UA neg  CXR Persistent bilateral interstitial prominence, pneumonitis cannot be excluded.  CSF WBC 2, RBC 7, protein 31, glucose 82 - no CNS infection  On Vancomycin + Cefepime -> Rocephin  troponinemia    Trop 1605->6484->6476  EF 30-35%, no vegetation  CCM on board  Cardiology on board concerning for demand ischemia  Recommend TEE to rule out LV thrombus or endocarditis  Respiratory failure  Intubated on vent  CCM on board  Not extubate candidate now given mental status  AKI  creatinine - 2.70->2.48->1.68->1.03  Normalized  BMP monitoring  On IVF  Hypertension  Home BP meds: none   Current BP meds: none   Stable . Avoid low BP . Long-term BP goal normotensive  Hyperlipidemia  Home Lipid lowering medication: Lipitor 20 mg daily  LDL 60, goal < 70  Hold off statin due to elevated LFTs  AST/ALT 74/55 -> pending  Consider statin at discharge if LFT  normalized  Cocaine abuse   Hx of cocaine use  UDS positive for  cocaine  Cessation education will be provided  Tobacco abuse  Current smoker  Smoking cessation counseling will be provided  Other Stroke Risk Factors  Family hx stroke (mother)   Substance abuse - THC positive, cessation education will be provided  Other Active Problems, Findings, Recommendations and/or Plan  Code status - Full code   Elevate LFTs  Hospital day # 2  This patient is critically ill due to sepsis, septic shock, stroke, elevated troponin, AKI, fever and at significant risk of neurological worsening, death form septic shock, endocarditis, recurrent stroke, heart failure, seizure. This patient's care requires constant monitoring of vital signs, hemodynamics, respiratory and cardiac monitoring, review of multiple databases, neurological assessment, discussion with family, other specialists and medical decision making of high complexity. I spent 35 minutes of neurocritical care time in the care of this patient.  I have discussed with CCM resident.  Rosalin Hawking, MD PhD Stroke Neurology 07/26/2020 7:51 PM     To contact Stroke Continuity provider, please refer to [email or web address] After hours, contact General Neurology

## 2020-07-26 NOTE — Progress Notes (Signed)
Pt transported to fluoro and back to 37M 13. No complications noted.

## 2020-07-26 NOTE — Progress Notes (Signed)
PT Cancellation Note  Patient Details Name: Benjamin Burke MRN: [id number] DOB: 01/15/1961   Cancelled Treatment:    Reason Eval/Treat Not Completed: Active bedrest order   [name] 07/26/2020, 8:23 AM  [name], PT Acute Rehabilitation Services Pager: [phone] Office: [phone]

## 2020-07-26 NOTE — Progress Notes (Signed)
SLP Cancellation Note  Patient Details Name: Benjamin Burke MRN: [id number] DOB: 1961-02-19   Cancelled treatment:        Attempted to see pt for cognitive linguistic evaluation.  Pt remains intubated at this time and is unable to participate.  SLP will follow for medical readiness for evaluation.  Consider placing orders for swallow evaluation after extubation when pt is medically appropriate for PO trials.    Celedonio Savage, [place], [place] Office: [phone]  07/26/2020, 9:19 AM

## 2020-07-26 NOTE — Progress Notes (Signed)
OT Cancellation Note  Patient Details Name: SONIA STICKELS MRN: [id number] DOB: 1961-02-09   Cancelled Treatment:    Reason Eval/Treat Not Completed: Active bedrest order. Spoke with RN and will continue to hold for today.  Golden Circle, OTR/L Acute Rehab Services Pager [phone] Office [phone]     Almon Register 07/26/2020, 8:44 AM

## 2020-07-26 NOTE — Progress Notes (Signed)
[facility] Progress Note Patient Name: Benjamin Burke DOB: Jun 09, 1960 MRN: [id number]   Date of Service  07/26/2020  HPI/Events of Note  Patient with sub-optimal sedation and frequent coughing on the ventilator despite PRN Fentanyl boluses.  eICU Interventions  Fentanyl infusion ordered.        Kerry Kass Audrionna Lampton 07/26/2020, 6:05 AM

## 2020-07-26 NOTE — Progress Notes (Signed)
NAME:  Benjamin Burke, MRN:  [id number], DOB:  [birth year], LOS: 2 ADMISSION DATE:  07/09/2020, CONSULTATION DATE:  07/03/2020 REFERRING MD:EDP, CHIEF COMPLAINT:  Code sepsis    Brief History   60 year old with known history of cocaine abuse admitted with altered mental status fever.  Past Medical History   Past Medical History: No date: Chronic back pain 08/09/2016: Cocaine abuse (HCC)     Comment:  Patient caught using cocaine in his hospital room while               an inpatient for spontaneous pneumothorax No date: COPD (chronic obstructive pulmonary disease) ([place]) No date: Hypertension 09/27/2012: Poor dentition 08/07/2016: Right spontaneous pneumothorax 03/24/2012: Tobacco abuse     Comment:  Overview:  1 PPD x age 75.  Prior h/o smokeless tobacco               but quit  Significant Hospital Events   Arrived unresponsive on 2/23, he was subsequently intubated and OG placed  Consults:  [facility] Neurology  Procedures:  Intubation 2/23  Significant Diagnostic Tests:  CXR 2/23-COPD with low lung volumes and persistent bilateral interstitial prominence, pneumonitis not excluded  CT head 2/23-abnormal mixed hypo and hyper densities in the superior left occipital lobe encompassing roughly 3 cm.  [name] an acute or subacute left PCA territory infarct with petechial hemorrhage but recommend had an MRI.  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 3 cm acute infarction at the left parietooccipital junction was visible at CT mild petechial blood products in the left parietooccipital infarction region. No evidence of frank hematoma or mass effect. Findings most likely secondary to global hypoperfusion event.  Micro Data:  2/23 2023 blood culture x2>> no growth 1 day 07/24/2021 sputum culture 07/24/2021 urine culture  Antimicrobials:  07/22/2020 vancomycin 07/02/2020 cefepime 07/23/2020 Flagyl  Interim  history/subjective:  Patient did well yesterday. He did become agitated and was choking on ET tube requiring increased sedation. We started the propofol drip. Attempted lumbar puncture yesterday per neurology's recommendations but was unable to obtain a sample. Scheduled for fluoroscopy guided LP today. Overnight he continued to choke on the ET tube so sedation was further increased to the addition of fentanyl. This was never actually initiated but this morning he continued to have episodes of choking on the tube so low-dose fentanyl initiated. Objective   Blood pressure 113/75, pulse 86, temperature 99.86 F (37.7 C), resp. rate (!) 23, height 5' 7.01" (1.702 m), weight 81.1 kg, SpO2 100 %.    Vent Mode: PRVC FiO2 (%):  [40 %] 40 % Set Rate:  [22 bmp] 22 bmp Vt Set:  [530 mL] 530 mL PEEP:  [5 cmH20] 5 cmH20 Pressure Support:  [10 cmH20] 10 cmH20 Plateau Pressure:  [13 cmH20-17 cmH20] 13 cmH20   Intake/Output Summary (Last 24 hours) at 07/26/2020 5993 Last data filed at 07/26/2020 0600 Gross per 24 hour  Intake 3217.46 ml  Output 1080 ml  Net 2137.46 ml   Filed Weights   07/03/2020 1115 07/25/20 0500 07/26/20 0500  Weight: 77.7 kg 79.6 kg 81.1 kg    Examination: General: 60 year old male, sedated, sleeping but intermittently gagging on ET tube. Moving lower extremities bilaterally but not upper HENT: ET tube in place with G-tube in place as well giving bilious drainage Lungs: Coarse rhonchi bilaterally, ventilated breath sounds Cardiovascular: Regular rate, no murmurs appreciated Abdomen: Positive bowel sounds Extremities: No gross abnormalities, no edema noted Neuro:  Pupils dilated, reactive to light, moves lower extremities bilaterally. I have not seen him move his upper extremities since admission GU: Foley remains in place with dark urine  Resolved Hospital Problem list     Assessment & Plan:   Benjamin Burke is a 60 y.o. with a pertinent PMH of hypertension, COPD,  polysubstance abuse, who presented unresponsive and admitted for acute respiratory failure, sepsis, acute CVA.   Acute encephalopathy likely secondary to CVA MRI showed watershed infarcts extending front to back affecting both hemispheres.  Neurology was consulted who feels most likely secondary to global hypoperfusion event.  Differential also includes vasospasm from cocaine use or septic emboli. Echocardiogram showed no source of emboli. -Neurology consulted, appreciate recommendations -Holding aspirin at this time -Telemetry -Neurology has recommended TEE to rule out LV thrombus or endocarditis -Frequent neurochecks -LP under fluoroscopy ordered for today -Hemoglobin A1c shows 6.1 and lipid panel overall normal. -PT/OT eval and treat, speech consult  - Lipator 80 mg daily   Sepsis secondary to bilateral lower lobe pneumonia Febrile on arrival with a temperature of 100.8.  Tachycardic at 115, respiratory rate of 26.  WBC 15.6 on arrival.  Repeat white count this morning of 16.7.  Blood cultures and urine cultures pending at this time.  Negative MRSA PCR.  Started on Flagyl, cefepime, vancomycin in the emergency department. Transitioned to ceftriaxone 2/24. WBCs improved to 14.9 on 2/25 -Continue to follow blood cultures and urine cultures -Antibiotics narrowed to ceftriaxone on 2/24 -Continue to monitor  Acute on chronic respiratory failure in the setting of COPD Patient has known history of COPD.  On arrival to the emergency department patient was on a nonrebreather with O2 sats in the 90s but was unable to protect his airway so he was intubated. -Currently on full vent support -Wean FiO2 for sats greater than 90% -Vent bundle -Daily SBT/WA when clinically appropriate  NSTEMI? Heart failure EKG on arrival showed sinus tachycardia.  Patient was unresponsive.  Troponin elevated at 6484 with 2-hour recheck decreased to 6476.  Most likely secondary to decreased perfusion in the setting of  shock/cocaine use. Echocardiogram showed LVEF of 30-35% with moderately decreased LV function. Also noted grade 1 diastolic dysfunction. -Statin 80 mg daily -Repeat EKG -Consider cardiology consult  AKI Baseline creatinine between one-point 8-1.  On arrival to the emergency department creatinine was 2.7. Creatinine improved to 1.03 today close to baseline. -Continue to monitor with daily BMPs -Strict I's and O's -Avoid nephrotoxic agents  History of polysubstance abuse (cocaine/marijuana/tobacco) Patient's family reported prior to arrival but the patient used cocaine and marijuana shortly before he was last seen well at approximately 2300 on 2/22.  UDS consistent with this.  Best Practice:  Diet: N.p.o. Pain/Anxiety/Delirium protocol (if indicated): Sedated at this time VAP protocol (if indicated): Ordered DVT prophylaxis: SCDs  GI prophylaxis: Pepcid  Glucose control: Sensitive sliding scale insulin Mobility: Bedrest Code Status: Full code Disposition: ICU  Labs   CBC: Recent Labs  Lab 07/17/2020 0937 07/29/2020 1012 07/23/2020 1140 07/17/2020 1247 07/25/20 0126 07/25/20 0610 07/26/20 0626  WBC 15.6*  --   --  20.8* 16.7*  --  14.9*  NEUTROABS 12.6*  --   --   --   --   --   --   HGB 17.0   < > 15.3 16.2 15.2 13.9 12.1*  HCT 51.5   < > 45.0 51.8 48.6 41.0 36.8*  MCV 92.8  --   --  94.9 94.2  --  93.2  PLT 365  --   --  309 305  --  263   < > = values in this interval not displayed.   Basic Metabolic Panel: Recent Labs  Lab 07/06/2020 0937 07/17/2020 1012 07/13/2020 1140 07/23/2020 1247 07/25/20 0126 07/25/20 0610 07/25/20 1629 07/26/20 0509 07/26/20 0626  NA 140 140 143 140 141 140  --   --  139  K 5.4* 5.3* 4.1 5.1 4.5 4.2  --   --  3.7  CL 102 106  --  106 108  --   --   --  104  CO2 22  --   --  21* 24  --   --   --  26  GLUCOSE 141* 140*  --  148* 128*  --   --   --  117*  BUN 23* 28*  --  23* 24*  --   --   --  20  CREATININE 2.76* 2.70*  --  2.48* 1.68*  --   --    --  1.03  CALCIUM 9.0  --   --  7.8* 8.3*  --   --   --  8.2*  MG  --   --   --  1.7 2.7*  --  2.1 2.2  --   PHOS  --   --   --  5.0* 2.2*  --  1.6* 1.6*  --    GFR: Estimated Creatinine Clearance: 80.7 mL/min (by C-G formula based on SCr of 1.03 mg/dL). Recent Labs  Lab 07/22/2020 0937 07/09/2020 0938 07/19/2020 1247 07/12/2020 1515 07/25/20 0126 07/26/20 0626  PROCALCITON  --   --  7.20  --   --   --   WBC 15.6*  --  20.8*  --  16.7* 14.9*  LATICACIDVEN  --  3.0* 3.3* 3.2*  --   --     Liver Function Tests: Recent Labs  Lab 07/13/2020 0937 07/04/2020 1247  AST 50* 74*  ALT 44 55*  ALKPHOS 88 77  BILITOT 0.6 0.9  PROT 7.5 6.6  ALBUMIN 4.0 3.4*   Recent Labs  Lab 07/27/2020 1247  LIPASE 32  AMYLASE 614*   Recent Labs  Lab 07/08/2020 0937  AMMONIA 43*    ABG    Component Value Date/Time   PHART 7.346 (L) 07/25/2020 0610   PCO2ART 42.1 07/25/2020 0610   PO2ART 76 (L) 07/25/2020 0610   HCO3 23.0 07/25/2020 0610   TCO2 24 07/25/2020 0610   ACIDBASEDEF 3.0 (H) 07/25/2020 0610   O2SAT 94.0 07/25/2020 0610     Coagulation Profile: Recent Labs  Lab 07/26/2020 0937 07/08/2020 1247  INR 1.1 1.1    Cardiac Enzymes: Recent Labs  Lab 07/05/2020 0937  CKTOTAL 294    HbA1C: Hgb A1c MFr Bld  Date/Time Value Ref Range Status  07/25/2020 01:26 AM 6.1 (H) 4.8 - 5.6 % Final    Comment:    (NOTE) Pre diabetes:          5.7%-6.4%  Diabetes:              >6.4%  Glycemic control for   <7.0% adults with diabetes     CBG: Recent Labs  Lab 07/25/20 1659 07/25/20 1950 07/25/20 2331 07/26/20 0334 07/26/20 0746  GLUCAP 98 101* 133* 93 116*    Past Medical History  He,  has a past medical history of Chronic back pain, Cocaine abuse ([place]) (08/09/2016), COPD (chronic obstructive pulmonary disease) ([place]), Hypertension, Poor dentition (  09/27/2012), Right spontaneous pneumothorax (08/07/2016), and Tobacco abuse (03/24/2012).   Surgical History    Past Surgical History:   Procedure Laterality Date  . NO PAST SURGERIES    . VIDEO ASSISTED THORACOSCOPY (VATS)/WEDGE RESECTION Right 02/27/2018   Procedure: VIDEO ASSISTED THORACOSCOPY (VATS)/WEDGE RESECTION;  Surgeon: Melrose Nakayama, MD;  Location: [facility];  Service: Thoracic;  Laterality: Right;     Social History   reports that he has been smoking cigarettes. He started smoking about 47 years ago. He has a 67.50 pack-year smoking history. He has never used smokeless tobacco. He reports that he does not drink alcohol.   Family History   His family history includes Arthritis in his mother; Cancer in his father; Diabetes in his mother; Hyperlipidemia in his mother; Hypertension in his mother; Kidney disease in his mother; Stroke in his mother.   Allergies Allergies  Allergen Reactions  . Tylenol [Acetaminophen] Nausea And Vomiting     Home Medications  Prior to Admission medications   Medication Sig Start Date End Date Taking? Authorizing Provider  albuterol (VENTOLIN HFA) 108 (90 Base) MCG/ACT inhaler Inhale 2 puffs into the lungs every 6 (six) hours as needed for wheezing or shortness of breath.   Yes [provider]  aspirin EC 81 MG tablet Take 162 mg by mouth daily as needed (pain).   Yes [provider]  atorvastatin (LIPITOR) 20 MG tablet Take 1 tablet by mouth daily. 12/31/19  Yes [provider]  ipratropium-albuterol (DUONEB) 0.5-2.5 (3) MG/3ML SOLN Take 3 mLs by nebulization every 6 (six) hours as needed (wheezing, shortness of breath). 03/04/18  Yes Bonnielee Haff, MD  oxyCODONE (OXY IR/ROXICODONE) 5 MG immediate release tablet Take 1 tablet (5 mg total) by mouth every 6 (six) hours as needed for severe pain. Take 5 mg by mouth every 4-6 hours PRN severe pain 03/29/18  Yes Melrose Nakayama, MD   Gifford Shave, MD  PGY-2, [facility]  07/26/20  8:52 AM

## 2020-07-26 NOTE — Progress Notes (Signed)
Had a discussion with the patient's sister at bedside regarding patient status today. We had a long discussion regarding what his quality of life was before this event. He was very active in the community and took care of his 58 year old son. She reported that he would want to stay as active as possible for as long as possible. When discussing and she thought the patient would like to stay on a ventilator or any kind of life support long-term she reported she feels he would want to stay on if there was any chance that he would recover. Of note, we also discussed the patient's drug use. The patient sister and POA is the only family member who knows about the drug use and she requests that no one else be notified of this. We will continue to monitor and wean sedation when possible. I will follow up with the patient's sister tomorrow to have further discussions about goals.

## 2020-07-27 ENCOUNTER — Inpatient Hospital Stay (HOSPITAL_COMMUNITY): Payer: Medicare Other

## 2020-07-27 DIAGNOSIS — A419 Sepsis, unspecified organism: Secondary | ICD-10-CM | POA: Diagnosis not present

## 2020-07-27 DIAGNOSIS — N179 Acute kidney failure, unspecified: Secondary | ICD-10-CM | POA: Diagnosis not present

## 2020-07-27 DIAGNOSIS — R778 Other specified abnormalities of plasma proteins: Secondary | ICD-10-CM | POA: Diagnosis not present

## 2020-07-27 DIAGNOSIS — I639 Cerebral infarction, unspecified: Secondary | ICD-10-CM | POA: Diagnosis not present

## 2020-07-27 DIAGNOSIS — R4182 Altered mental status, unspecified: Secondary | ICD-10-CM

## 2020-07-27 DIAGNOSIS — F1010 Alcohol abuse, uncomplicated: Secondary | ICD-10-CM

## 2020-07-27 DIAGNOSIS — I5021 Acute systolic (congestive) heart failure: Secondary | ICD-10-CM | POA: Diagnosis not present

## 2020-07-27 DIAGNOSIS — T17800S Unspecified foreign body in other parts of respiratory tract causing asphyxiation, sequela: Secondary | ICD-10-CM | POA: Diagnosis not present

## 2020-07-27 DIAGNOSIS — Z978 Presence of other specified devices: Secondary | ICD-10-CM

## 2020-07-27 DIAGNOSIS — R401 Stupor: Secondary | ICD-10-CM | POA: Diagnosis not present

## 2020-07-27 LAB — CBC
HCT: 36.4 % — ABNORMAL LOW (ref 39.0–52.0)
Hemoglobin: 12 g/dL — ABNORMAL LOW (ref 13.0–17.0)
MCH: 30.2 pg (ref 26.0–34.0)
MCHC: 33 g/dL (ref 30.0–36.0)
MCV: 91.5 fL (ref 80.0–100.0)
Platelets: 296 10*3/uL (ref 150–400)
RBC: 3.98 MIL/uL — ABNORMAL LOW (ref 4.22–5.81)
RDW: 15.4 % (ref 11.5–15.5)
WBC: 11.7 10*3/uL — ABNORMAL HIGH (ref 4.0–10.5)
nRBC: 0 % (ref 0.0–0.2)

## 2020-07-27 LAB — HEPATIC FUNCTION PANEL
ALT: 60 U/L — ABNORMAL HIGH (ref 0–44)
AST: 45 U/L — ABNORMAL HIGH (ref 15–41)
Albumin: 2.6 g/dL — ABNORMAL LOW (ref 3.5–5.0)
Alkaline Phosphatase: 90 U/L (ref 38–126)
Bilirubin, Direct: 0.2 mg/dL (ref 0.0–0.2)
Indirect Bilirubin: 0.4 mg/dL (ref 0.3–0.9)
Total Bilirubin: 0.6 mg/dL (ref 0.3–1.2)
Total Protein: 5.9 g/dL — ABNORMAL LOW (ref 6.5–8.1)

## 2020-07-27 LAB — GLUCOSE, CAPILLARY
Glucose-Capillary: 106 mg/dL — ABNORMAL HIGH (ref 70–99)
Glucose-Capillary: 109 mg/dL — ABNORMAL HIGH (ref 70–99)
Glucose-Capillary: 110 mg/dL — ABNORMAL HIGH (ref 70–99)
Glucose-Capillary: 116 mg/dL — ABNORMAL HIGH (ref 70–99)
Glucose-Capillary: 123 mg/dL — ABNORMAL HIGH (ref 70–99)
Glucose-Capillary: 124 mg/dL — ABNORMAL HIGH (ref 70–99)

## 2020-07-27 LAB — BASIC METABOLIC PANEL
Anion gap: 8 (ref 5–15)
BUN: 17 mg/dL (ref 6–20)
CO2: 24 mmol/L (ref 22–32)
Calcium: 8.3 mg/dL — ABNORMAL LOW (ref 8.9–10.3)
Chloride: 106 mmol/L (ref 98–111)
Creatinine, Ser: 0.87 mg/dL (ref 0.61–1.24)
GFR, Estimated: >60 mL/min (ref >60)
Glucose, Bld: 128 mg/dL — ABNORMAL HIGH (ref 70–99)
Potassium: 4 mmol/L (ref 3.5–5.1)
Sodium: 138 mmol/L (ref 135–145)

## 2020-07-27 LAB — TSH: TSH: 4.358 u[IU]/mL (ref 0.350–4.500)

## 2020-07-27 MED ORDER — CARVEDILOL 12.5 MG PO TABS
6.2500 mg | ORAL_TABLET | Freq: Two times a day (BID) | ORAL | Status: DC
  Administered 2020-07-27 – 2020-07-28 (×2): 6.25 mg via ORAL
  Filled 2020-07-27 (×3): qty 1

## 2020-07-27 NOTE — Progress Notes (Signed)
Cardiology Progress Note  Patient ID: Benjamin Burke MRN: [id number] DOB: [birth year] Date of Encounter: 07/27/2020  Primary Cardiologist: No primary care provider on file.  Subjective   Chief Complaint: None.  Intubated sedated on the vent.  HPI: Admitted after being found unresponsive in the setting of possible drug overdose.  Bilateral watershed infarcts.  Neurological prognosis is guarded.  Cardiology consulted for congestive heart failure and need for transesophageal echocardiogram.  ROS:  All other ROS reviewed and negative. Pertinent positives noted in the HPI.     Inpatient Medications  Scheduled Meds: . atorvastatin  80 mg Per Tube Daily  . chlorhexidine gluconate (MEDLINE KIT)  15 mL Mouth Rinse BID  . Chlorhexidine Gluconate Cloth  6 each Topical Daily  . docusate  100 mg Per Tube BID  . famotidine  20 mg Per Tube BID  . heparin injection (subcutaneous)  5,000 Units Subcutaneous Q8H  . losartan  25 mg Per Tube Daily  . mouth rinse  15 mL Mouth Rinse 10 times per day  . polyethylene glycol  17 g Per Tube Daily   Continuous Infusions: . cefTRIAXone (ROCEPHIN)  IV Stopped (07/26/20 1042)  . dexmedetomidine (PRECEDEX) IV infusion 0.2 mcg/kg/hr (07/27/20 0600)  . feeding supplement (VITAL AF 1.2 CAL) Stopped (07/26/20 0554)  . fentaNYL infusion INTRAVENOUS 50 mcg/hr (07/27/20 0600)   PRN Meds: acetaminophen (TYLENOL) oral liquid 160 mg/5 mL, fentaNYL (SUBLIMAZE) injection   Vital Signs   Vitals:   07/27/20 0530 07/27/20 0600 07/27/20 0630 07/27/20 0734  BP: 104/73 107/74 129/77   Pulse: 71 68 76   Resp: (!) 25 (!) 23 14 (!) 24  Temp: 99.68 F (37.6 C) 99.68 F (37.6 C) 99.5 F (37.5 C)   TempSrc:      SpO2: 100% 100% 100%   Weight:      Height:        Intake/Output Summary (Last 24 hours) at 07/27/2020 0736 Last data filed at 07/27/2020 0600 Gross per 24 hour  Intake 2162.7 ml  Output 700 ml  Net 1462.7 ml   Last 3 Weights 07/27/2020 07/26/2020 07/25/2020   Weight (lbs) 181 lb 7 oz 178 lb 12.7 oz 175 lb 7.8 oz  Weight (kg) 82.3 kg 81.1 kg 79.6 kg      Telemetry  Overnight telemetry shows sinus rhythm heart rate 80s, which I personally reviewed.   ECG  The most recent ECG shows sinus tachycardia heart rate 112, old anteroseptal infarct, which I personally reviewed.   Physical Exam   Vitals:   07/27/20 0530 07/27/20 0600 07/27/20 0630 07/27/20 0734  BP: 104/73 107/74 129/77   Pulse: 71 68 76   Resp: (!) 25 (!) 23 14 (!) 24  Temp: 99.68 F (37.6 C) 99.68 F (37.6 C) 99.5 F (37.5 C)   TempSrc:      SpO2: 100% 100% 100%   Weight:      Height:         Intake/Output Summary (Last 24 hours) at 07/27/2020 0736 Last data filed at 07/27/2020 0600 Gross per 24 hour  Intake 2162.7 ml  Output 700 ml  Net 1462.7 ml    Last 3 Weights 07/27/2020 07/26/2020 07/25/2020  Weight (lbs) 181 lb 7 oz 178 lb 12.7 oz 175 lb 7.8 oz  Weight (kg) 82.3 kg 81.1 kg 79.6 kg    Body mass index is 28.41 kg/m.   General: Intubated on vent Head: Atraumatic, normal size  Eyes: PEERLA, EOMI  Neck: Supple, no JVD Endocrine:  No thryomegaly Cardiac: Normal S1, S2; RRR; no murmurs, rubs, or gallops Lungs: Diminished breath sounds bilaterally Abd: Soft, nontender, no hepatomegaly  Ext: No edema, pulses 2+ Musculoskeletal: No deformities, BUE and BLE strength normal and equal Skin: Warm and dry, no rashes   Neuro: Alert and oriented to person, place, time, and situation, CNII-XII grossly intact, no focal deficits  Psych: Normal mood and affect   Labs  High Sensitivity Troponin:   Recent Labs  Lab 07/29/2020 0937 07/18/2020 1247 07/26/2020 1515  TROPONINIHS 1,605* 6,484* 6,476*     Cardiac EnzymesNo results for input(s): TROPONINI in the last 168 hours. No results for input(s): TROPIPOC in the last 168 hours.  Chemistry Recent Labs  Lab 07/25/2020 [phone] 07/11/2020 1012 07/08/2020 1247 07/25/20 0126 07/25/20 0610 07/26/20 0626 07/27/20 0119  NA 140   < > 140  141 140 139 138  K 5.4*   < > 5.1 4.5 4.2 3.7 4.0  CL 102   < > 106 108  --  104 106  CO2 22  --  21* 24  --  26 24  GLUCOSE 141*   < > 148* 128*  --  117* 128*  BUN 23*   < > 23* 24*  --  20 17  CREATININE 2.76*   < > 2.48* 1.68*  --  1.03 0.87  CALCIUM 9.0  --  7.8* 8.3*  --  8.2* 8.3*  PROT 7.5  --  6.6  --   --   --  5.9*  ALBUMIN 4.0  --  3.4*  --   --   --  2.6*  AST 50*  --  74*  --   --   --  45*  ALT 44  --  55*  --   --   --  60*  ALKPHOS 88  --  77  --   --   --  90  BILITOT 0.6  --  0.9  --   --   --  0.6  GFRNONAA 26*  --  30* 47*  --  >60 >60  ANIONGAP 16*  --  13 9  --  9 8   < > = values in this interval not displayed.    Hematology Recent Labs  Lab 07/25/20 0126 07/25/20 0610 07/26/20 0626 07/27/20 0119  WBC 16.7*  --  14.9* 11.7*  RBC 5.16  --  3.95* 3.98*  HGB 15.2 13.9 12.1* 12.0*  HCT 48.6 41.0 36.8* 36.4*  MCV 94.2  --  93.2 91.5  MCH 29.5  --  30.6 30.2  MCHC 31.3  --  32.9 33.0  RDW 15.1  --  15.5 15.4  PLT 305  --  263 296   BNPNo results for input(s): BNP, PROBNP in the last 168 hours.  DDimer No results for input(s): DDIMER in the last 168 hours.   Radiology  ECHOCARDIOGRAM COMPLETE  Result Date: 07/25/2020    ECHOCARDIOGRAM REPORT   Patient Name:   Benjamin Burke Date of Exam: 07/25/2020 Medical Rec #:  [id number]       Height:       67.0 in Accession #:    [id number]      Weight:       175.5 lb Date of Birth:  28-Jun-1960        BSA:          1.913 m Patient Age:    60 years        BP:  126/91 mmHg Patient Gender: M               HR:           89 bpm. Exam Location:  Inpatient Procedure: 2D Echo, Cardiac Doppler, Color Doppler and Intracardiac            Opacification Agent Indications:    NSTEMI  History:        Patient has no prior history of Echocardiogram examinations.                 COPD; Risk Factors:Hypertension.  Sonographer:    Bernadene Person RDCS Referring Phys: [id number] Candee Furbish  Sonographer Comments: Suboptimal apical  window. Image acquisition challenging due to respiratory motion and Image acquisition challenging due to COPD. IMPRESSIONS  1. Left ventricular ejection fraction, by estimation, is 30 to 35%. The left ventricle has moderately decreased function. The left ventricle demonstrates global hypokinesis. Left ventricular diastolic parameters are consistent with Grade I diastolic dysfunction (impaired relaxation).  2. Right ventricular systolic function is normal. The right ventricular size is normal.  3. The mitral valve is normal in structure. No evidence of mitral valve regurgitation. No evidence of mitral stenosis.  4. The aortic valve is normal in structure. Aortic valve regurgitation is not visualized. No aortic stenosis is present.  5. The inferior vena cava is normal in size with greater than 50% respiratory variability, suggesting right atrial pressure of 3 mmHg. FINDINGS  Left Ventricle: Left ventricular ejection fraction, by estimation, is 30 to 35%. The left ventricle has moderately decreased function. The left ventricle demonstrates global hypokinesis. Definity contrast agent was given IV to delineate the left ventricular endocardial borders. The left ventricular internal cavity size was normal in size. There is no left ventricular hypertrophy. Left ventricular diastolic parameters are consistent with Grade I diastolic dysfunction (impaired relaxation). Right Ventricle: The right ventricular size is normal. No increase in right ventricular wall thickness. Right ventricular systolic function is normal. Left Atrium: Left atrial size was normal in size. Right Atrium: Right atrial size was normal in size. Pericardium: There is no evidence of pericardial effusion. Mitral Valve: The mitral valve is normal in structure. No evidence of mitral valve regurgitation. No evidence of mitral valve stenosis. Tricuspid Valve: The tricuspid valve is normal in structure. Tricuspid valve regurgitation is not demonstrated. No  evidence of tricuspid stenosis. Aortic Valve: The aortic valve is normal in structure. Aortic valve regurgitation is not visualized. No aortic stenosis is present. Pulmonic Valve: The pulmonic valve was normal in structure. Pulmonic valve regurgitation is not visualized. No evidence of pulmonic stenosis. Aorta: The aortic root is normal in size and structure. Venous: The inferior vena cava is normal in size with greater than 50% respiratory variability, suggesting right atrial pressure of 3 mmHg. IAS/Shunts: No atrial level shunt detected by color flow Doppler.  LEFT VENTRICLE PLAX 2D LVIDd:         4.80 cm      Diastology LVIDs:         3.80 cm      LV e' medial:    5.19 cm/s LV PW:         0.60 cm      LV E/e' medial:  11.8 LV IVS:        0.70 cm      LV e' lateral:   5.34 cm/s LVOT diam:     1.90 cm      LV E/e' lateral: 11.5 LV SV:  48 LV SV Index:   25 LVOT Area:     2.84 cm  LV Volumes (MOD) LV vol d, MOD A4C: 135.0 ml LV vol s, MOD A4C: 93.1 ml LV SV MOD A4C:     135.0 ml RIGHT VENTRICLE RV S prime:     10.40 cm/s TAPSE (M-mode): 1.9 cm LEFT ATRIUM             Index       RIGHT ATRIUM           Index LA diam:        3.10 cm 1.62 cm/m  RA Area:     15.10 cm LA Vol (A2C):   26.4 ml 13.80 ml/m RA Volume:   41.50 ml  21.69 ml/m LA Vol (A4C):   25.2 ml 13.17 ml/m LA Biplane Vol: 27.5 ml 14.38 ml/m  AORTIC VALVE LVOT Vmax:   116.00 cm/s LVOT Vmean:  85.600 cm/s LVOT VTI:    0.169 m  AORTA Ao Root diam: 2.90 cm Ao Asc diam:  3.00 cm MITRAL VALVE MV Area (PHT): 2.36 cm    SHUNTS MV Decel Time: 322 msec    Systemic VTI:  0.17 m MV E velocity: 61.30 cm/s  Systemic Diam: 1.90 cm MV A velocity: 80.60 cm/s MV E/A ratio:  0.76 Candee Furbish MD Electronically signed by Candee Furbish MD Signature Date/Time: 07/25/2020/2:06:12 PM    Final    VAS US CAROTID (at [facility] and [facility] only)  Result Date: 07/25/2020 Carotid Arterial Duplex Burke Indications:       CVA. Risk Factors:      Hypertension. Comparison Burke:  no  prior Performing Technologist: Abram Sander RVS  Examination Guidelines: A complete evaluation includes B-mode imaging, spectral Doppler, color Doppler, and power Doppler as needed of all accessible portions of each vessel. Bilateral testing is considered an integral part of a complete examination. Limited examinations for reoccurring indications may be performed as noted.  Right Carotid Findings: +----------+-------+-------+--------+---------------------------------+--------+           PSV    EDV    StenosisPlaque Description               Comments           cm/s   cm/s                                                     +----------+-------+-------+--------+---------------------------------+--------+ CCA Prox  49     19             heterogenous                              +----------+-------+-------+--------+---------------------------------+--------+ CCA Distal32     13             heterogenous, calcific and                                                irregular                                 +----------+-------+-------+--------+---------------------------------+--------+ ICA Prox  92     35     1-39%   heterogenous, irregular and                                               calcific                                  +----------+-------+-------+--------+---------------------------------+--------+ ICA Distal91     37                                                       +----------+-------+-------+--------+---------------------------------+--------+ ECA                     Occluded                                          +----------+-------+-------+--------+---------------------------------+--------+ +----------+--------+-------+--------+-------------------+           PSV cm/sEDV cmsDescribeArm Pressure (mmHG) +----------+--------+-------+--------+-------------------+ [id number]                                          +----------+--------+-------+--------+-------------------+ +---------+--------+--+--------+--+---------+ VertebralPSV cm/s69EDV cm/s16Antegrade +---------+--------+--+--------+--+---------+  Left Carotid Findings: +----------+--------+--------+--------+-------------------------+---------+           PSV cm/sEDV cm/sStenosisPlaque Description       Comments  +----------+--------+--------+--------+-------------------------+---------+ CCA Prox  78      22              heterogenous                       +----------+--------+--------+--------+-------------------------+---------+ CCA Distal57      17              heterogenous                       +----------+--------+--------+--------+-------------------------+---------+ ICA Prox  80      30      1-39%   heterogenous and calcificShadowing +----------+--------+--------+--------+-------------------------+---------+ ICA Distal101     48                                                 +----------+--------+--------+--------+-------------------------+---------+ ECA       91      23                                                 +----------+--------+--------+--------+-------------------------+---------+ +----------+--------+--------+--------+-------------------+           PSV cm/sEDV cm/sDescribeArm Pressure (mmHG) +----------+--------+--------+--------+-------------------+ [id number]                                          +----------+--------+--------+--------+-------------------+ +---------+--------+--+--------+--+---------+  VertebralPSV cm/s42EDV cm/s15Antegrade +---------+--------+--+--------+--+---------+   Summary: Right Carotid: Velocities in the right ICA are consistent with a 1-39% stenosis.                The ECA appears occluded. Left Carotid: Velocities in the left ICA are consistent with a 1-39% stenosis. Vertebrals: Bilateral vertebral arteries demonstrate antegrade flow. *See table(s) above  for measurements and observations.     Preliminary    DG FL GUIDED LUMBAR PUNCTURE  Result Date: 07/26/2020 CLINICAL DATA:  Altered mental status. Watershed infarction on MRI. Intubated patient. EXAM: DIAGNOSTIC LUMBAR PUNCTURE UNDER FLUOROSCOPIC GUIDANCE FLUOROSCOPY TIME:  Fluoroscopy Time:  48 seconds Radiation Exposure Index (if provided by the fluoroscopic device): 12.9 mGy Number of Acquired Spot Images: 1 PROCEDURE: Informed consent was obtained from the patient prior to the procedure, including potential complications of headache, allergy, and pain. With the patient prone, the lower back was prepped with Betadine. 1% Lidocaine was used for local anesthesia. Lumbar puncture was performed at the L3-L4 level using a 20 gauge needle with return of clear CSF with an opening pressure of 38 cm water. Closing pressure equal approximately 15 cm water. Nine ml of CSF were obtained for laboratory studies. The patient tolerated the procedure well and there were no apparent complications. IMPRESSION: 1. Successful lumbar puncture. 2. Opening pressure equal 38 cm water. Electronically Signed   By: Suzy Bouchard M.D.   On: 07/26/2020 11:28    Cardiac Studies  TTE 07/25/2020 1. Left ventricular ejection fraction, by estimation, is 30 to 35%. The  left ventricle has moderately decreased function. The left ventricle  demonstrates global hypokinesis. Left ventricular diastolic parameters are  consistent with Grade I diastolic  dysfunction (impaired relaxation).  2. Right ventricular systolic function is normal. The right ventricular  size is normal.  3. The mitral valve is normal in structure. No evidence of mitral valve  regurgitation. No evidence of mitral stenosis.  4. The aortic valve is normal in structure. Aortic valve regurgitation is  not visualized. No aortic stenosis is present.  5. The inferior vena cava is normal in size with greater than 50%  respiratory variability, suggesting right  atrial pressure of 3 mmHg.   Patient Profile  Benjamin Burke is a 60 y.o. male with tobacco abuse, hypertension, COPD, cocaine abuse who was admitted on 07/05/2020 after being found unresponsive.  He was brought to the hospital for sepsis and intubated for airway protection.  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  Cardiology consulted for new onset cardiomyopathy and elevated troponin.  Assessment & Plan   1.  Acute stroke -Neurology has recommended TEE.  He remains intubated and is not responding to commands.  No esophageal issues or cirrhosis reported.   -His stroke appears to be related to global hypoperfusion.  I suspect this is in the setting of drug overdose.  I would ask neurology to consider how strongly they feel about a transesophageal echocardiogram.  This does not appear to be an embolic event.   -If they feel strongly we will proceed on Monday for a TEE at the bedside.  2.  New onset cardiomyopathy, EF 30-35% -Admitted with possible drug overdose.  LV shows global hypokinesis.  Troponin elevation is minimal for this level of LV dysfunction.  Overall, I suspect this is either a cardiomyopathy related to stress versus drug use. -BP seem to be stable. -Coreg is okay even in setting of cocaine use.  We will start that today. -Continue losartan. -Really  no strong indication to be aggressive about treating his cardiomyopathy.  Especially in the setting of unclear neurological prognosis.  We will follow along for now.  If his prognosis changes clearly I recommendation will change. -Appears euvolemic.  3.  Elevated troponin/demand ischemia versus non-STEMI -Admitted with critical illness in the setting of drug overdose and likely sepsis. -EKG without ST elevation.  EF now 30-35%.  No clear ischemic changes but likely old anteroseptal infarct on EKG. -I suspect overall this is just demand.  He has no regional wall motion abnormalities to suggest an acute infarct.   Could be related to cocaine use. -Would start aspirin 81 if okay per neurology. -Continue statin. -Starting beta-blocker. -Would hold heparin given watershed infarcts. -He will need an ischemia evaluation pending his neurological prognosis.  Will follow along for this.  For questions or updates, please contact [place] Please consult [email or web address] for contact info under   Time Spent with Patient: I have spent a total of 25 minutes with patient reviewing hospital notes, telemetry, EKGs, labs and examining the patient as well as establishing an assessment and plan that was discussed with the patient.  > 50% of time was spent in direct patient care.    Signed, Addison Naegeli. Audie Box, [place]  07/27/2020 7:36 AM

## 2020-07-27 NOTE — Progress Notes (Signed)
[facility] Progress Note Patient Name: Benjamin Burke DOB: 03/29/1961 MRN: [id number]   Date of Service  07/27/2020  HPI/Events of Note  Patient with history of known watershed infarcts. has periods of agiation/gagging on ett/coughing fits. Nurse called to say he has new upward gaze though.Patient is followed by Neurology.   eICU Interventions  Plan: 1. Request that nursing contact Neurology for evaluation and further w/u if indicated.      Intervention Category Major Interventions: Change in mental status - evaluation and management  Marco Adelson [place] 07/27/2020, 12:23 AM

## 2020-07-27 NOTE — Procedures (Addendum)
Patient Name: Benjamin Burke  MRN: [id number]  Epilepsy Attending: Lora Havens  Referring Physician/Provider: Dr Lesleigh Noe Date: 07/27/2020  Duration: 24.22 mins  Patient history: 60yo M with ams. EEG to evaluate for seizure.  Level of alertness: comatose  AEDs during EEG study: None  Technical aspects: This EEG study was done with scalp electrodes positioned according to the 10-20 International system of electrode placement. Electrical activity was acquired at a sampling rate of 500Hz  and reviewed with a high frequency filter of 70Hz  and a low frequency filter of 1Hz . EEG data were recorded continuously and digitally stored.   Description: EEG showed continuous generalized 3 to 6 Hz theta-delta slowing. Hyperventilation and photic stimulation were not performed.     ABNORMALITY -Continuous slow, generalized  IMPRESSION: This study is suggestive of moderate diffuse encephalopathy, nonspecific etiology. No seizures or epileptiform discharges were seen throughout the recording.  Benjamin Burke

## 2020-07-27 NOTE — Progress Notes (Signed)
NAME:  AAKASH HOLLOMON, MRN:  [id number], DOB:  08/10/1960, LOS: 3 ADMISSION DATE:  07/17/2020, CONSULTATION DATE:  07/08/2020 REFERRING MD:EDP, CHIEF COMPLAINT:  Code sepsis    Brief History   60 year old with known history of cocaine abuse admitted with altered mental status fever.  Past Medical History   Past Medical History: No date: Chronic back pain 08/09/2016: Cocaine abuse (HCC)     Comment:  Patient caught using cocaine in his hospital room while               an inpatient for spontaneous pneumothorax No date: COPD (chronic obstructive pulmonary disease) ([place]) No date: Hypertension 09/27/2012: Poor dentition 08/07/2016: Right spontaneous pneumothorax 03/24/2012: Tobacco abuse     Comment:  Overview:  1 PPD x age 67.  Prior h/o smokeless tobacco               but quit  Significant Hospital Events   Arrived unresponsive on 2/23, he was subsequently intubated and OG placed  Consults:  [facility] Neurology  Procedures:  Intubation 2/23  Significant Diagnostic Tests:  CXR 2/23-COPD with low lung volumes and persistent bilateral interstitial prominence, pneumonitis not excluded  CT head 2/23-abnormal mixed hypo and hyper densities in the superior left occipital lobe encompassing roughly 3 cm.  [name] an acute or subacute left PCA territory infarct with petechial hemorrhage but recommend had an MRI.  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 3 cm acute infarction at the left parietooccipital junction was visible at CT mild petechial blood products in the left parietooccipital infarction region. No evidence of frank hematoma or mass effect. Findings most likely secondary to global hypoperfusion event.  Micro Data:  2/23 2023 blood culture x2>> no growth 1 day 07/24/2021 sputum culture 07/24/2021 urine culture  Antimicrobials:  07/17/2020 vancomycin 07/15/2020 cefepime 07/09/2020 Flagyl  Interim  history/subjective:  Sedation was attempted to wean yesterday but patient continued to gag on the ET tube.  Propofol was discontinued and patient was switched to Precedex and fentanyl.  The wean was continued throughout the day and overnight.  Overnight patient had a new upward gaze and neurology was called. Objective   Blood pressure 129/77, pulse 76, temperature 99.5 F (37.5 C), resp. rate (!) 24, height 5' 7.01" (1.702 m), weight 82.3 kg, SpO2 100 %.    Vent Mode: PRVC FiO2 (%):  [40 %] 40 % Set Rate:  [22 bmp] 22 bmp Vt Set:  [530 mL] 530 mL PEEP:  [5 cmH20] 5 cmH20 Plateau Pressure:  [13 cmH20-25 cmH20] 16 cmH20   Intake/Output Summary (Last 24 hours) at 07/27/2020 0736 Last data filed at 07/27/2020 0600 Gross per 24 hour  Intake 2162.7 ml  Output 700 ml  Net 1462.7 ml   Filed Weights   07/25/20 0500 07/26/20 0500 07/27/20 0418  Weight: 79.6 kg 81.1 kg 82.3 kg    Examination: General: 60 year old male, sedated on a ventilator tube.  Continues to move lower extremities intermittently but not upper extremities. HENT: ET tube in place with bite-block in place Lungs: Coarse rhonchi bilaterally, ventilated breath sounds Cardiovascular: Regular rate, no murmurs appreciated Abdomen: Positive bowel sounds Extremities: No gross abnormalities, no edema noted Neuro: Pupils dilated, reactive to light, moves lower extremities bilaterally.  Patient continues to move lower extremities but not upper extremities GU: Foley remains in place with dark urine  Resolved Hospital Problem list     Assessment & Plan:   Noel Journey  is a 60 y.o. with a pertinent PMH of hypertension, COPD, polysubstance abuse, who presented unresponsive and admitted for acute respiratory failure, sepsis, acute CVA.   Acute encephalopathy likely secondary to CVA MRI showed watershed infarcts extending front to back affecting both hemispheres.  Neurology was consulted who feels most likely secondary to global  hypoperfusion event.  Differential also includes vasospasm from cocaine use or septic emboli. Echocardiogram showed no source of emboli. -Neurology consulted, appreciate recommendations -Holding aspirin at this time -Telemetry -TEE to rule out LV thrombus or endocarditis on Monday 2/28 -Frequent neurochecks -LP culture results pending at this time but cell count reassuring that there is likely no infection -Hemoglobin A1c shows 6.1 and lipid panel overall normal. -PT/OT eval and treat, speech consult  - Lipator 80 mg daily   Sepsis secondary to bilateral lower lobe pneumonia Febrile on arrival with a temperature of 100.8.  Tachycardic at 115, respiratory rate of 26.  WBC 15.6 on arrival.  Repeat white count this morning of 16.7.  Blood cultures and urine cultures pending at this time.  Negative MRSA PCR.  Started on Flagyl, cefepime, vancomycin in the emergency department. Transitioned to ceftriaxone 2/24.  Patient was febrile overnight up to 101.3.  White count continues to downtrend to 11.7 from 14. -Continue to follow blood cultures and urine cultures -Antibiotics narrowed to ceftriaxone on 2/24  -Continue to monitor  Acute on chronic respiratory failure in the setting of COPD Patient has known history of COPD.  On arrival to the emergency department patient was on a nonrebreather with O2 sats in the 90s but was unable to protect his airway so he was intubated. -Currently on full vent support -Wean FiO2 for sats greater than 90% -Vent bundle -Daily SBT/WA when clinically appropriate  NSTEMI? Heart failure EKG on arrival showed sinus tachycardia.  Patient was unresponsive.  Troponin elevated at 6484 with 2-hour recheck decreased to 6476.  Most likely secondary to decreased perfusion in the setting of shock/cocaine use. Echocardiogram showed LVEF of 30-35% with moderately decreased LV function. Also noted grade 1 diastolic dysfunction.  Cardiology will plan ischemic evaluation if  meaningful neurologic recovery.  They are also planning on a TEE at bedside on Monday. -Statin 80 mg daily -Repeat EKG -Cardiology consulted, appreciate recommendations  AKI Baseline creatinine between one-point 8-1.  On arrival to the emergency department creatinine was 2.7. Creatinine improved to 0.87 this morning.  Back at baseline. -Continue to monitor with daily BMPs -Strict I's and O's -Avoid nephrotoxic agents  History of polysubstance abuse (cocaine/marijuana/tobacco) Patient's family reported prior to arrival but the patient used cocaine and marijuana shortly before he was last seen well at approximately 2300 on 2/22.  UDS consistent with this.  Best Practice:  Diet: N.p.o. Pain/Anxiety/Delirium protocol (if indicated): Precedex and fentanyl at this time VAP protocol (if indicated): Ordered DVT prophylaxis: SCDs  GI prophylaxis: Pepcid  Glucose control: Sensitive sliding scale insulin Mobility: Bedrest Code Status: Full code Disposition: ICU  Labs   CBC: Recent Labs  Lab 07/23/2020 0937 07/21/2020 1012 07/03/2020 1247 07/25/20 0126 07/25/20 0610 07/26/20 0626 07/27/20 0119  WBC 15.6*  --  20.8* 16.7*  --  14.9* 11.7*  NEUTROABS 12.6*  --   --   --   --   --   --   HGB 17.0   < > 16.2 15.2 13.9 12.1* 12.0*  HCT 51.5   < > 51.8 48.6 41.0 36.8* 36.4*  MCV 92.8  --  94.9 94.2  --  93.2 91.5  PLT 365  --  309 305  --  263 296   < > = values in this interval not displayed.   Basic Metabolic Panel: Recent Labs  Lab 07/22/2020 0937 07/26/2020 1012 07/12/2020 1140 07/19/2020 1247 07/25/20 0126 07/25/20 0610 07/25/20 1629 07/26/20 0509 07/26/20 0626 07/26/20 1621 07/26/20 1940 07/27/20 0119  NA 140 140   < > 140 141 140  --   --  139  --   --  138  K 5.4* 5.3*   < > 5.1 4.5 4.2  --   --  3.7  --   --  4.0  CL 102 106  --  106 108  --   --   --  104  --   --  106  CO2 22  --   --  21* 24  --   --   --  26  --   --  24  GLUCOSE 141* 140*  --  148* 128*  --   --   --   117*  --   --  128*  BUN 23* 28*  --  23* 24*  --   --   --  20  --   --  17  CREATININE 2.76* 2.70*  --  2.48* 1.68*  --   --   --  1.03  --   --  0.87  CALCIUM 9.0  --   --  7.8* 8.3*  --   --   --  8.2*  --   --  8.3*  MG  --   --   --  1.7 2.7*  --  2.1 2.2  --  2.0  --   --   PHOS  --   --    < > 5.0* 2.2*  --  1.6* 1.6*  --  2.9 3.3  --    < > = values in this interval not displayed.   GFR: Estimated Creatinine Clearance: 96.2 mL/min (by C-G formula based on SCr of 0.87 mg/dL). Recent Labs  Lab 07/20/2020 0938 07/13/2020 1247 07/25/2020 1515 07/25/20 0126 07/26/20 0626 07/27/20 0119  PROCALCITON  --  7.20  --   --   --   --   WBC  --  20.8*  --  16.7* 14.9* 11.7*  LATICACIDVEN 3.0* 3.3* 3.2*  --   --   --     Liver Function Tests: Recent Labs  Lab 07/26/2020 0937 07/28/2020 1247 07/27/20 0119  AST 50* 74* 45*  ALT 44 55* 60*  ALKPHOS 88 77 90  BILITOT 0.6 0.9 0.6  PROT 7.5 6.6 5.9*  ALBUMIN 4.0 3.4* 2.6*   Recent Labs  Lab 07/09/2020 1247  LIPASE 32  AMYLASE 614*   Recent Labs  Lab 07/18/2020 0937  AMMONIA 43*    ABG    Component Value Date/Time   PHART 7.346 (L) 07/25/2020 0610   PCO2ART 42.1 07/25/2020 0610   PO2ART 76 (L) 07/25/2020 0610   HCO3 23.0 07/25/2020 0610   TCO2 24 07/25/2020 0610   ACIDBASEDEF 3.0 (H) 07/25/2020 0610   O2SAT 94.0 07/25/2020 0610     Coagulation Profile: Recent Labs  Lab 07/29/2020 0937 07/02/2020 1247  INR 1.1 1.1    Cardiac Enzymes: Recent Labs  Lab 07/12/2020 0937  CKTOTAL 294    HbA1C: Hgb A1c MFr Bld  Date/Time Value Ref Range Status  07/25/2020 01:26 AM 6.1 (H) 4.8 - 5.6 % Final    Comment:    (  NOTE) Pre diabetes:          5.7%-6.4%  Diabetes:              >6.4%  Glycemic control for   <7.0% adults with diabetes     CBG: Recent Labs  Lab 07/26/20 1119 07/26/20 1524 07/26/20 1959 07/26/20 2340 07/27/20 0328  GLUCAP 104* 124* 97 110* 110*    Past Medical History  He,  has a past medical history  of Chronic back pain, Cocaine abuse ([place]) (08/09/2016), COPD (chronic obstructive pulmonary disease) ([place]), Hypertension, Poor dentition (09/27/2012), Right spontaneous pneumothorax (08/07/2016), and Tobacco abuse (03/24/2012).   Surgical History    Past Surgical History:  Procedure Laterality Date   NO PAST SURGERIES     VIDEO ASSISTED THORACOSCOPY (VATS)/WEDGE RESECTION Right 02/27/2018   Procedure: VIDEO ASSISTED THORACOSCOPY (VATS)/WEDGE RESECTION;  Surgeon: Melrose Nakayama, MD;  Location: [facility];  Service: Thoracic;  Laterality: Right;     Social History   reports that he has been smoking cigarettes. He started smoking about 47 years ago. He has a 67.50 pack-year smoking history. He has never used smokeless tobacco. He reports that he does not drink alcohol.   Family History   His family history includes Arthritis in his mother; Cancer in his father; Diabetes in his mother; Hyperlipidemia in his mother; Hypertension in his mother; Kidney disease in his mother; Stroke in his mother.   Allergies Allergies  Allergen Reactions   Tylenol [Acetaminophen] Nausea And Vomiting     Home Medications  Prior to Admission medications   Medication Sig Start Date End Date Taking? Authorizing Provider  albuterol (VENTOLIN HFA) 108 (90 Base) MCG/ACT inhaler Inhale 2 puffs into the lungs every 6 (six) hours as needed for wheezing or shortness of breath.   Yes [provider]  aspirin EC 81 MG tablet Take 162 mg by mouth daily as needed (pain).   Yes [provider]  atorvastatin (LIPITOR) 20 MG tablet Take 1 tablet by mouth daily. 12/31/19  Yes [provider]  ipratropium-albuterol (DUONEB) 0.5-2.5 (3) MG/3ML SOLN Take 3 mLs by nebulization every 6 (six) hours as needed (wheezing, shortness of breath). 03/04/18  Yes Bonnielee Haff, MD  oxyCODONE (OXY IR/ROXICODONE) 5 MG immediate release tablet Take 1 tablet (5 mg total) by mouth every 6 (six) hours as needed for severe  pain. Take 5 mg by mouth every 4-6 hours PRN severe pain 03/29/18  Yes Melrose Nakayama, MD   Gifford Shave, MD  PGY-2, [facility]  07/27/20  7:36 AM

## 2020-07-27 NOTE — Progress Notes (Signed)
EEG complete - results pending 

## 2020-07-27 NOTE — Progress Notes (Signed)
Spoke with Benjamin Burke, and she would like a family meeting with doctor on Monday, February 28th at [place].  Will let MD aware.

## 2020-07-27 NOTE — Progress Notes (Signed)
OT Cancellation Note  Patient Details Name: Benjamin Burke MRN: [id number] DOB: Nov 27, 1960   Cancelled Treatment:    Reason Eval/Treat Not Completed: Active bedrest order. OT to continue to follow for OT eval.  Jefferey Pica, OTR/L Acute Rehabilitation Services Pager: [phone] Office: [place] 07/27/2020, 9:13 AM

## 2020-07-27 NOTE — Progress Notes (Signed)
PT Cancellation Note  Patient Details Name: LENWARD ABLE MRN: [id number] DOB: May 22, 1961   Cancelled Treatment:    Reason Eval/Treat Not Completed: Patient not medically ready and with bedrest orders.  Will check back tomorrow.   Leighton Tonkinson, [place]  Pager [phone] Office [place] 07/27/2020, 10:42 AM

## 2020-07-27 NOTE — Progress Notes (Addendum)
STROKE TEAM PROGRESS NOTE   INTERVAL HISTORY No family is at the bedside.  Pt still intubated, on sedation improved leukocytosis.  LP not consistent with CNS infection.  Continue Rocephin.  Pending TEE next week. Had up gaze overnight and EEG ordered.  OBJECTIVE Vitals:   07/27/20 1230 07/27/20 1300 07/27/20 1330 07/27/20 1400  BP: 99/65 108/85 90/61 (!) 86/59  Pulse: 64 65 64 64  Resp: (!) 22 (!) 25 (!) 22 (!) 23  Temp: 99.68 F (37.6 C) 99.68 F (37.6 C) 99.68 F (37.6 C) 99.68 F (37.6 C)  TempSrc:    Bladder  SpO2: 100% 100% 100% 99%  Weight:      Height:        CBC:  Recent Labs  Lab 07/15/2020 0937 07/16/2020 1012 07/26/20 0626 07/27/20 0119  WBC 15.6*   < > 14.9* 11.7*  NEUTROABS 12.6*  --   --   --   HGB 17.0   < > 12.1* 12.0*  HCT 51.5   < > 36.8* 36.4*  MCV 92.8   < > 93.2 91.5  PLT 365   < > 263 296   < > = values in this interval not displayed.    Basic Metabolic Panel:  Recent Labs  Lab 07/26/20 0509 07/26/20 0626 07/26/20 1621 07/26/20 1940 07/27/20 0119  NA  --  139  --   --  138  K  --  3.7  --   --  4.0  CL  --  104  --   --  106  CO2  --  26  --   --  24  GLUCOSE  --  117*  --   --  128*  BUN  --  20  --   --  17  CREATININE  --  1.03  --   --  0.87  CALCIUM  --  8.2*  --   --  8.3*  MG 2.2  --  2.0  --   --   PHOS 1.6*  --  2.9 3.3  --     Lipid Panel:     Component Value Date/Time   CHOL 116 07/25/2020 0126   TRIG 96 07/25/2020 0126   TRIG 99 07/25/2020 0126   HDL 36 (L) 07/25/2020 0126   CHOLHDL 3.2 07/25/2020 0126   VLDL 20 07/25/2020 0126   LDLCALC 60 07/25/2020 0126   HgbA1c:  Lab Results  Component Value Date   HGBA1C 6.1 (H) 07/25/2020   Urine Drug Screen:     Component Value Date/Time   LABOPIA NONE DETECTED 07/07/2020 1025   COCAINSCRNUR POSITIVE (A) 07/09/2020 1025   LABBENZ NONE DETECTED 07/07/2020 1025   AMPHETMU NONE DETECTED 07/08/2020 1025   THCU POSITIVE (A) 07/29/2020 1025   LABBARB NONE DETECTED  07/09/2020 1025    Alcohol Level     Component Value Date/Time   ETH <10 07/23/2020 0939    IMAGING CT HEAD WITHOUT CONTRAST 07/29/2020 TECHNIQUE: Contiguous axial images were obtained from the base of the skull through the vertex without intravenous contrast. COMPARISON:  None. FINDINGS: Brain: Patchy bilateral fairly symmetric white matter hypodensity in both hemispheres. Confluent asymmetric mixed hypo and hyperdensity in the superior left occipital pole, series 3, image 14 and coronal image 63. No similar cortical hypodensity elsewhere. Deep gray nuclei and posterior fossa gray-white differentiation remains within normal limits. No associated intracranial mass effect. No midline shift. No ventriculomegaly. No extra-axial hemorrhage identified. Vascular: Mild Calcified atherosclerosis at the skull  base. Suggestion of fetal type left PCA origin. Questionable hyperdense left PCA P2 are P3 segment on series 3, image 15. No other suspicious intracranial vascular hyperdensity. Skull: Negative. Sinuses/Orbits: Right nasal airway in place. Small volume retained secretions in the nasopharynx. Mild mucosal thickening and bubbly opacity in the left ethmoid and maxillary sinuses. Tympanic cavities and mastoids are clear. Other: Visualized orbits and scalp soft tissues are within normal limits. IMPRESSION: 1. Abnormal mixed hypo- and hyperdensity in the superior left occipital lobe encompassing roughly 3 cm. [name] this is an acute or subacute Left PCA territory infarct with petechial hemorrhage (questionable hyperdense left PCA) - but recommend Head MRI (without contrast may suffice) to confirm. 2. No significant intracranial mass effect. Superimposed age advanced bilateral white matter changes, most commonly due to chronic small vessel disease. 3. Right nasal airway in place. Mild left paranasal sinus inflammation.  MRI brain 07/20/2020 No acute infarction affects the brainstem or cerebellum. 3  cm acute infarction at the left parietooccipital junction on the left. Watershed pattern infarctions extending front to back affecting both hemispheres at the watershed regions. Cortical and white matter involvement as is typical. Mild chronic small-vessel ischemic change of the cerebral hemispheric white matter otherwise is suspected. No evidence of mass, frank hematoma, hydrocephalus or extra-axial collection. Minor petechial blood products present in the left parietooccipital junction region infarction. Vascular: Major vessels at the base of the brain show flow. Skull and upper cervical spine: Negative Sinuses/Orbits: Mucosal inflammatory changes of the paranasal sinuses. Orbits are negative.  MRA HEAD FINDINGS 07/12/2020 Both internal carotid arteries are widely patent through the skull base and siphon regions. The anterior and middle cerebral vessels are patent. 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 Fetal origin of the left PCA from the anterior circulation.  Dominant left vertebral artery widely patent to the basilar. Non dominant right vertebral artery shows atherosclerotic narrowing and irregularity with does give flow to the basilar. The basilar artery shows atherosclerotic irregularity but no stenosis. Superior cerebellar and posterior cerebral arteries show flow. As noted above, left PCA arises from the anterior circulation.  IMPRESSION: 1. Watershed pattern infarctions extending front to back affecting both hemispheres at the watershed regions. 3 cm acute infarction at the left parietooccipital junction was visible at CT mild petechial blood products in the left parietooccipital infarction region. No evidence of frank hematoma or mass effect. Findings most likely secondary to global hypoperfusion event. 2. Background pattern of mild chronic small-vessel ischemic change of the cerebral hemispheric white matter elsewhere. 3. MR angiography does not show any  large or medium vessel occlusion. There is atherosclerotic irregularity of the non dominant right vertebral artery and the basilar artery but no significant stenosis. 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 Fetal origin of the left PCA from the anterior circulation.   DG FL GUIDED LUMBAR PUNCTURE  Result Date: 07/26/2020 CLINICAL DATA:  Altered mental status. Watershed infarction on MRI. Intubated patient. EXAM: DIAGNOSTIC LUMBAR PUNCTURE UNDER FLUOROSCOPIC GUIDANCE FLUOROSCOPY TIME:  Fluoroscopy Time:  48 seconds Radiation Exposure Index (if provided by the fluoroscopic device): 12.9 mGy Number of Acquired Spot Images: 1 PROCEDURE: Informed consent was obtained from the patient prior to the procedure, including potential complications of headache, allergy, and pain. With the patient prone, the lower back was prepped with Betadine. 1% Lidocaine was used for local anesthesia. Lumbar puncture was performed at the L3-L4 level using a 20 gauge needle with return of clear CSF with an opening pressure of 38  cm water. Closing pressure equal approximately 15 cm water. Nine ml of CSF were obtained for laboratory studies. The patient tolerated the procedure well and there were no apparent complications. IMPRESSION: 1. Successful lumbar puncture. 2. Opening pressure equal 38 cm water. Electronically Signed   By: Suzy Bouchard M.D.   On: 07/26/2020 11:28   Transthoracic Echocardiogram  1. Left ventricular ejection fraction, by estimation, is 30 to 35%. The  left ventricle has moderately decreased function. The left ventricle  demonstrates global hypokinesis. Left ventricular diastolic parameters are  consistent with Grade I diastolic  dysfunction (impaired relaxation).  2. Right ventricular systolic function is normal. The right ventricular  size is normal.  3. The mitral valve is normal in structure. No evidence of mitral valve  regurgitation. No evidence of mitral stenosis.   4. The aortic valve is normal in structure. Aortic valve regurgitation is  not visualized. No aortic stenosis is present.  5. The inferior vena cava is normal in size with greater than 50%  respiratory variability, suggesting right atrial pressure of 3 mmHg.   Bilateral Carotid Dopplers  Right Carotid: Velocities in the right ICA are consistent with a 1-39%  stenosis. The ECA appears occluded.  Left Carotid: Velocities in the left ICA are consistent with a 1-39%  stenosis.  Vertebrals: Bilateral vertebral arteries demonstrate antegrade flow.   ECG - ST rate 112 BPM. (See cardiology reading for complete details)  PHYSICAL EXAM - Relatively unchanged compared to yesterday.  Temp:  [99.32 F (37.4 C)-101.3 F (38.5 C)] 99.68 F (37.6 C) (02/26 1400) Pulse Rate:  [64-98] 64 (02/26 1400) Resp:  [12-33] 23 (02/26 1400) BP: (83-186)/(58-108) 86/59 (02/26 1400) SpO2:  [95 %-100 %] 99 % (02/26 1400) FiO2 (%):  [40 %] 40 % (02/26 1400) Weight:  [82.3 kg] 82.3 kg (02/26 0418)  General - Well nourished, well developed, appears older than listed age, intubated on sedation.  Ophthalmologic - fundi not visualized due to noncooperation.  Cardiovascular - Regular rhythm with tachycardia.  Neuro - intubated on fentanyl and Precedex, eyes closed not opening to voice and does not follow commands. With forced eye opening, eyes in mid position, not blinking to visual threat, doll's eyes present, not tracking, PERRL. Corneal reflex weakly present bilaterally, gag and cough present. Breathing over the vent.  Facial symmetry not able to test due to ET tube. Tongue protrusion not cooperative. On pain stimulation, no movement in all extremities. DTR diminished and no babinski. Sensation, coordination and gait not tested.    ASSESSMENT/PLAN [name] is a 60 y.o. male with history of cocaine abuse, chronic back pain, hypertension, COPD and tobacco abuse, brought into the emergency room after  he was  found down by family.  He did not receive IV t-PA due to late presentation (>4.5 hours from time of onset).   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 However, cardioembolic source can not be ruled out, need TEE to rule out LV thrombus or endocarditis  CT head - Abnormal mixed hypo- and hyperdensity in the superior left occipital lobe encompassing roughly 3 cm. [name] this is an acute or subacute Left PCA territory infarct with petechial hemorrhage (questionable hyperdense left PCA)   MRI head - Watershed pattern infarctions extending front to back affecting both hemispheres at the watershed regions. 3 cm acute infarction at the left parietooccipital junction was visible at CT mild petechial blood  products in the left parietooccipital infarction region.   MRA head - unremarkable, nondominant right VA with distal occlusion  Carotid Doppler - unremarkable  2D Echo - EF 30-35% but no vegetation  Recommend TEE to rule out LV thrombus or endocarditis  Lacey Jensen Virus 2 - negative  LDL - 60  HgbA1c - 6.1  UDS - Cocaine and THCU  VTE prophylaxis - heparin IV  aspirin 81 mg daily prior to admission, now on No antithrombotic given concerns of endocarditis  Ongoing aggressive stroke risk factor management  Therapy recommendations:  pending  Disposition:  Pending  Sepsis Fever  Leukocytosis  Tmax 100.8->101.3  Leukocytosis - WBC's - 15.6->20.8->16.7->14.9  Blood cultures NGTD  UA neg  CXR Persistent bilateral interstitial prominence, pneumonitis cannot be excluded.  CSF WBC 2, RBC 7, protein 31, glucose 82 - no CNS infection  On Vancomycin + Cefepime -> Rocephin  troponinemia    Trop 1605->6484->6476  EF 30-35%, no vegetation  CCM on board  Cardiology on board concerning for demand ischemia  Recommend TEE to rule out LV thrombus or  endocarditis  Respiratory failure  Intubated on vent  CCM on board  Not extubate candidate now given mental status  AKI  creatinine - 2.70->2.48->1.68->1.03  Normalized  BMP monitoring  On IVF  Hypertension  Home BP meds: none   Current BP meds: none   Stable . Avoid low BP . Long-term BP goal normotensive  Hyperlipidemia  Home Lipid lowering medication: Lipitor 20 mg daily  LDL 60, goal < 70  Hold off statin due to elevated LFTs  AST/ALT 74/55 -> 45/60 consider statin at discharge.  Cocaine abuse   Hx of cocaine use  UDS positive for cocaine  Cessation education will be provided  Tobacco abuse  Current smoker  Smoking cessation counseling will be provided  Other Stroke Risk Factors  Family hx stroke (mother)   Substance abuse - THC positive, cessation education will be provided  Other Active Problems, Findings, Recommendations and/or Plan  Code status - Full code   Elevate LFTs  Hospital day # 3  This patient is critically ill due to sepsis, septic shock, stroke, elevated troponin, AKI, and at significant risk of neurological worsening, death form septic shock, endocarditis, recurrent stroke, heart failure, seizure. This patient's care requires constant monitoring of vital signs, hemodynamics, respiratory and cardiac monitoring, review of multiple databases, neurological assessment, discussion with family, other specialists and medical decision making of high complexity. I spent 35 minutes of neurocritical care time in the care of this patient.  I have discussed with CCM resident.   Lynnae Sandhoff, MD Page: [phone] 07/27/2020 2:31 PM  To contact Stroke Continuity provider, please refer to [email or web address] After hours, contact General Neurology

## 2020-07-28 DIAGNOSIS — R778 Other specified abnormalities of plasma proteins: Secondary | ICD-10-CM | POA: Diagnosis not present

## 2020-07-28 DIAGNOSIS — N179 Acute kidney failure, unspecified: Secondary | ICD-10-CM | POA: Diagnosis not present

## 2020-07-28 DIAGNOSIS — G9340 Encephalopathy, unspecified: Secondary | ICD-10-CM

## 2020-07-28 DIAGNOSIS — I639 Cerebral infarction, unspecified: Secondary | ICD-10-CM | POA: Diagnosis not present

## 2020-07-28 DIAGNOSIS — I5021 Acute systolic (congestive) heart failure: Secondary | ICD-10-CM | POA: Diagnosis not present

## 2020-07-28 DIAGNOSIS — R401 Stupor: Secondary | ICD-10-CM | POA: Diagnosis not present

## 2020-07-28 DIAGNOSIS — T17800S Unspecified foreign body in other parts of respiratory tract causing asphyxiation, sequela: Secondary | ICD-10-CM | POA: Diagnosis not present

## 2020-07-28 LAB — BASIC METABOLIC PANEL
Anion gap: 9 (ref 5–15)
BUN: 18 mg/dL (ref 6–20)
CO2: 28 mmol/L (ref 22–32)
Calcium: 8.6 mg/dL — ABNORMAL LOW (ref 8.9–10.3)
Chloride: 104 mmol/L (ref 98–111)
Creatinine, Ser: 0.77 mg/dL (ref 0.61–1.24)
GFR, Estimated: >60 mL/min (ref >60)
Glucose, Bld: 141 mg/dL — ABNORMAL HIGH (ref 70–99)
Potassium: 3.3 mmol/L — ABNORMAL LOW (ref 3.5–5.1)
Sodium: 141 mmol/L (ref 135–145)

## 2020-07-28 LAB — TRIGLYCERIDES: Triglycerides: 75 mg/dL (ref <150)

## 2020-07-28 LAB — GLUCOSE, CAPILLARY
Glucose-Capillary: 110 mg/dL — ABNORMAL HIGH (ref 70–99)
Glucose-Capillary: 121 mg/dL — ABNORMAL HIGH (ref 70–99)
Glucose-Capillary: 123 mg/dL — ABNORMAL HIGH (ref 70–99)
Glucose-Capillary: 127 mg/dL — ABNORMAL HIGH (ref 70–99)
Glucose-Capillary: 165 mg/dL — ABNORMAL HIGH (ref 70–99)
Glucose-Capillary: 89 mg/dL (ref 70–99)

## 2020-07-28 LAB — CBC
HCT: 36.9 % — ABNORMAL LOW (ref 39.0–52.0)
Hemoglobin: 12 g/dL — ABNORMAL LOW (ref 13.0–17.0)
MCH: 30.3 pg (ref 26.0–34.0)
MCHC: 32.5 g/dL (ref 30.0–36.0)
MCV: 93.2 fL (ref 80.0–100.0)
Platelets: 291 10*3/uL (ref 150–400)
RBC: 3.96 MIL/uL — ABNORMAL LOW (ref 4.22–5.81)
RDW: 15.2 % (ref 11.5–15.5)
WBC: 8.8 10*3/uL (ref 4.0–10.5)
nRBC: 0 % (ref 0.0–0.2)

## 2020-07-28 MED ORDER — CARVEDILOL 12.5 MG PO TABS
12.5000 mg | ORAL_TABLET | Freq: Two times a day (BID) | ORAL | Status: DC
  Administered 2020-07-28 – 2020-08-01 (×9): 12.5 mg via ORAL
  Filled 2020-07-28 (×9): qty 1

## 2020-07-28 MED ORDER — POTASSIUM CHLORIDE 20 MEQ PO PACK
40.0000 meq | PACK | Freq: Once | ORAL | Status: AC
  Administered 2020-07-28: 40 meq
  Filled 2020-07-28: qty 2

## 2020-07-28 MED ORDER — FUROSEMIDE 10 MG/ML IJ SOLN
40.0000 mg | Freq: Four times a day (QID) | INTRAMUSCULAR | Status: AC
  Administered 2020-07-28 (×2): 40 mg via INTRAVENOUS
  Filled 2020-07-28 (×2): qty 4

## 2020-07-28 MED ORDER — SPIRONOLACTONE 25 MG PO TABS
25.0000 mg | ORAL_TABLET | Freq: Every day | ORAL | Status: DC
  Administered 2020-07-28 – 2020-08-01 (×5): 25 mg via ORAL
  Filled 2020-07-28 (×5): qty 1

## 2020-07-28 NOTE — Progress Notes (Signed)
Pharmacy Electrolyte Replacement  Recent Labs:  Recent Labs    07/26/20 1621 07/26/20 1940 07/27/20 0119 07/28/20 0642  K  --   --    < > 3.3*  MG 2.0  --   --   --   PHOS 2.9 3.3  --   --   CREATININE  --   --    < > 0.77   < > = values in this interval not displayed.    Low Critical Values (K </= 2.5, Phos </= 1, Mg </= 1) Present: None  Plan: KCl 40 mEq per tube x 1  Barth Kirks, PharmD, BCCCP Clinical Pharmacist [phone]  Please check AMION for all [place] numbers  07/28/2020 9:23 AM

## 2020-07-28 NOTE — Progress Notes (Signed)
NAME:  Benjamin Burke, MRN:  [id number], DOB:  1961-03-16, LOS: 4 ADMISSION DATE:  07/25/2020, CONSULTATION DATE:  2/23 REFERRING MD:  EDP, CHIEF COMPLAINT:  Found down   Brief History:  60 y/o male admitted after found nearly unresponsive by family, had fever, had been using cocaine the night before.   Past Medical History:  Chronic back pain Cocaine abuse COPD Hypertension Poor dentition History of spontaneous pneumothorax Tobacco abuse  Significant Hospital Events:    Consults:  CCM Neurology  Procedures:  2/23 ET>   Significant Diagnostic Tests:  CT head 2/23-abnormal mixed hypo and hyper densities in the superior left occipital lobe encompassing roughly 3 cm.  [name] an acute or subacute left PCA territory infarct with petechial hemorrhage but recommend had an MRI.  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 3 cm acute infarction at the left parietooccipital junction was visible at CT mild petechial blood products in the left parietooccipital infarction region. No evidence of frank hematoma or mass effect. Findings most likely secondary to global hypoperfusion event.  2/24 TTE LVEF 24-58%, RV systolic function normal, valves OK  Micro Data:  2/23 2023 blood culture x2>> no growth 1 day 07/24/2021 sputum culture 07/24/2021 urine culture  Antimicrobials:  07/13/2020 vancomycin x1 07/11/2020 cefepime x1 07/23/2020 Flagyl x1 2/24 ceftriaxone >   Interim History / Subjective:  No acute events   Objective   Blood pressure 103/73, pulse (!) 59, temperature 99.68 F (37.6 C), resp. rate (!) 22, height 5' 7.01" (1.702 m), weight 83.3 kg, SpO2 100 %.    Vent Mode: PRVC FiO2 (%):  [40 %] 40 % Set Rate:  [22 bmp] 22 bmp Vt Set:  [530 mL-560 mL] 530 mL PEEP:  [5 cmH20] 5 cmH20 Plateau Pressure:  [8 cmH20-15 cmH20] 8 cmH20   Intake/Output Summary (Last 24 hours) at 07/28/2020 0758 Last  data filed at 07/28/2020 0500 Gross per 24 hour  Intake 2190.93 ml  Output 970 ml  Net 1220.93 ml   Filed Weights   07/26/20 0500 07/27/20 0418 07/28/20 0500  Weight: 81.1 kg 82.3 kg 83.3 kg    Examination: General:  In bed on vent HENT: NCAT ETT in place PULM: CTA B, vent supported breathing CV: RRR, no mgr GI: BS+, soft, nontender MSK: normal bulk and tone Neuro: on vent, opens eyes to loud sounds, no purposeful movements   Resolved Hospital Problem list     Assessment & Plan:  Acute metabolic encephalopathy  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 > presumably related to hypoperfusion in setting of cardiac arrhythmia Continue to minimize sedation as able F/u neurology recommendations RASS target 0 TEE on 2/28 per neuro request   Aspiration pneumonia Ceftriaxone ends today  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 Silverio Lay and the patient's sister Summary of discussion: full scope of practice Follow up goals of care discussion due: 2/28 at 1600 with McQuaid and extended family Code Status: full  Labs   CBC:  Recent Labs  Lab 07/04/2020 0937 07/11/2020 1012 07/05/2020 1247 07/25/20 0126 07/25/20 0610 07/26/20 0626 07/27/20 0119 07/28/20 0642  WBC 15.6*  --  20.8* 16.7*  --  14.9* 11.7* 8.8  NEUTROABS 12.6*  --   --   --   --   --   --   --   HGB 17.0   < > 16.2 15.2 13.9 12.1* 12.0* 12.0*   HCT 51.5   < > 51.8 48.6 41.0 36.8* 36.4* 36.9*  MCV 92.8  --  94.9 94.2  --  93.2 91.5 93.2  PLT 365  --  309 305  --  263 296 291   < > = values in this interval not displayed.    Basic Metabolic Panel: Recent Labs  Lab 07/05/2020 1247 07/25/20 0126 07/25/20 0610 07/25/20 1629 07/26/20 0509 07/26/20 0626 07/26/20 1621 07/26/20 1940 07/27/20 0119 07/28/20 0642  NA 140 141 140  --   --  139  --   --  138 141  K 5.1 4.5 4.2  --   --  3.7  --   --  4.0 3.3*  CL 106 108  --   --   --  104  --   --  106 104  CO2 21* 24  --   --   --  26  --   --  24 28  GLUCOSE 148* 128*  --   --   --  117*  --   --  128* 141*  BUN 23* 24*  --   --   --  20  --   --  17 18  CREATININE 2.48* 1.68*  --   --   --  1.03  --   --  0.87 0.77  CALCIUM 7.8* 8.3*  --   --   --  8.2*  --   --  8.3* 8.6*  MG 1.7 2.7*  --  2.1 2.2  --  2.0  --   --   --   PHOS 5.0* 2.2*  --  1.6* 1.6*  --  2.9 3.3  --   --    GFR: Estimated Creatinine Clearance: 105.2 mL/min (by C-G formula based on SCr of 0.77 mg/dL). Recent Labs  Lab 07/09/2020 0938 07/20/2020 1247 07/04/2020 1515 07/25/20 0126 07/26/20 0626 07/27/20 0119 07/28/20 0642  PROCALCITON  --  7.20  --   --   --   --   --   WBC  --  20.8*  --  16.7* 14.9* 11.7* 8.8  LATICACIDVEN 3.0* 3.3* 3.2*  --   --   --   --     Liver Function Tests: Recent Labs  Lab 07/23/2020 0937 07/20/2020 1247 07/27/20 0119  AST 50* 74* 45*  ALT 44 55* 60*  ALKPHOS 88 77 90  BILITOT 0.6 0.9 0.6  PROT 7.5 6.6 5.9*  ALBUMIN 4.0 3.4* 2.6*   Recent Labs  Lab 07/11/2020 1247  LIPASE 32  AMYLASE 614*   Recent Labs  Lab 07/09/2020 0937  AMMONIA 43*    ABG    Component Value Date/Time   PHART 7.346 (L) 07/25/2020 0610   PCO2ART 42.1 07/25/2020 0610   PO2ART 76 (L) 07/25/2020 0610   HCO3 23.0 07/25/2020 0610   TCO2 24 07/25/2020 0610   ACIDBASEDEF 3.0 (H) 07/25/2020 0610   O2SAT 94.0 07/25/2020 0610     Coagulation Profile: Recent Labs  Lab 07/11/2020 0937  07/03/2020 1247  INR 1.1 1.1  Cardiac Enzymes: Recent Labs  Lab 07/14/2020 0937  CKTOTAL 294    HbA1C: Hgb A1c MFr Bld  Date/Time Value Ref Range Status  07/25/2020 01:26 AM 6.1 (H) 4.8 - 5.6 % Final    Comment:    (NOTE) Pre diabetes:          5.7%-6.4%  Diabetes:              >6.4%  Glycemic control for   <7.0% adults with diabetes     CBG: Recent Labs  Lab 07/27/20 1207 07/27/20 1550 07/27/20 2015 07/27/20 2333 07/28/20 0330  GLUCAP 116* 123* 106* 109* 121*     Critical care time: 31 minutes    Roselie Awkward, MD [facility] Pager: [phone] Cell: [phone] If no response, please call [phone] until 7pm After 7:00 pm call Elink  [phone]

## 2020-07-28 NOTE — Progress Notes (Signed)
OT Cancellation Note + Discharge  Patient Details Name: Benjamin Burke MRN: [id number] DOB: [birth year]   Cancelled Treatment:    Reason Eval/Treat Not Completed: Active bedrest order;Patient not medically ready (OT signing off at this time. Pt not medically stable for OT intervention.)   Jefferey Pica, OTR/L Acute Rehabilitation Services Pager: [phone] Office: [phone]   Benjamin Burke 07/28/2020, 8:49 AM

## 2020-07-28 NOTE — Progress Notes (Signed)
STROKE TEAM PROGRESS NOTE   INTERVAL HISTORY No family is at the bedside. Pt still intubated, on sedation improved leukocytosis. LP not consistent with CNS infection.  Continue Rocephin. Pending TEE next week. EEG did not show seizure or epileptiform activity.  OBJECTIVE Vitals:   07/28/20 0430 07/28/20 0500 07/28/20 0530 07/28/20 0722  BP: 107/74 97/70 103/73   Pulse: 60 61 (!) 59   Resp: (!) 22 (!) 22 (!) 22   Temp: 99.68 F (37.6 C) 99.68 F (37.6 C) 99.68 F (37.6 C)   TempSrc:      SpO2: 99% 99% 100% 100%  Weight:  83.3 kg    Height:       CBC:  Recent Labs  Lab 07/23/2020 0937 07/19/2020 1012 07/27/20 0119 07/28/20 0642  WBC 15.6*   < > 11.7* 8.8  NEUTROABS 12.6*  --   --   --   HGB 17.0   < > 12.0* 12.0*  HCT 51.5   < > 36.4* 36.9*  MCV 92.8   < > 91.5 93.2  PLT 365   < > 296 291   < > = values in this interval not displayed.   Basic Metabolic Panel:  Recent Labs  Lab 07/26/20 0509 07/26/20 0626 07/26/20 1621 07/26/20 1940 07/27/20 0119 07/28/20 0642  NA  --    < >  --   --  138 141  K  --    < >  --   --  4.0 3.3*  CL  --    < >  --   --  106 104  CO2  --    < >  --   --  24 28  GLUCOSE  --    < >  --   --  128* 141*  BUN  --    < >  --   --  17 18  CREATININE  --    < >  --   --  0.87 0.77  CALCIUM  --    < >  --   --  8.3* 8.6*  MG 2.2  --  2.0  --   --   --   PHOS 1.6*  --  2.9 3.3  --   --    < > = values in this interval not displayed.    Lipid Panel:     Component Value Date/Time   CHOL 116 07/25/2020 0126   TRIG 75 07/28/2020 0642   HDL 36 (L) 07/25/2020 0126   CHOLHDL 3.2 07/25/2020 0126   VLDL 20 07/25/2020 0126   LDLCALC 60 07/25/2020 0126   HgbA1c:  Lab Results  Component Value Date   HGBA1C 6.1 (H) 07/25/2020   Urine Drug Screen:     Component Value Date/Time   LABOPIA NONE DETECTED 07/08/2020 1025   COCAINSCRNUR POSITIVE (A) 07/19/2020 1025   LABBENZ NONE DETECTED 07/21/2020 1025   AMPHETMU NONE DETECTED 07/28/2020 1025    THCU POSITIVE (A) 07/14/2020 1025   LABBARB NONE DETECTED 07/09/2020 1025    Alcohol Level     Component Value Date/Time   ETH <10 07/09/2020 0939    IMAGING CT HEAD WITHOUT CONTRAST 07/26/2020 TECHNIQUE: Contiguous axial images were obtained from the base of the skull through the vertex without intravenous contrast. COMPARISON:  None. FINDINGS: Brain: Patchy bilateral fairly symmetric white matter hypodensity in both hemispheres. Confluent asymmetric mixed hypo and hyperdensity in the superior left occipital pole, series 3, image 14 and coronal image 63.  No similar cortical hypodensity elsewhere. Deep gray nuclei and posterior fossa gray-white differentiation remains within normal limits. No associated intracranial mass effect. No midline shift. No ventriculomegaly. No extra-axial hemorrhage identified. Vascular: Mild Calcified atherosclerosis at the skull base. Suggestion of fetal type left PCA origin. Questionable hyperdense left PCA P2 are P3 segment on series 3, image 15. No other suspicious intracranial vascular hyperdensity. Skull: Negative. Sinuses/Orbits: Right nasal airway in place. Small volume retained secretions in the nasopharynx. Mild mucosal thickening and bubbly opacity in the left ethmoid and maxillary sinuses. Tympanic cavities and mastoids are clear. Other: Visualized orbits and scalp soft tissues are within normal limits. IMPRESSION: 1. Abnormal mixed hypo- and hyperdensity in the superior left occipital lobe encompassing roughly 3 cm. [name] this is an acute or subacute Left PCA territory infarct with petechial hemorrhage (questionable hyperdense left PCA) - but recommend Head MRI (without contrast may suffice) to confirm. 2. No significant intracranial mass effect. Superimposed age advanced bilateral white matter changes, most commonly due to chronic small vessel disease. 3. Right nasal airway in place. Mild left paranasal sinus inflammation.  MRI brain 07/12/2020 No  acute infarction affects the brainstem or cerebellum. 3 cm acute infarction at the left parietooccipital junction on the left. Watershed pattern infarctions extending front to back affecting both hemispheres at the watershed regions. Cortical and white matter involvement as is typical. Mild chronic small-vessel ischemic change of the cerebral hemispheric white matter otherwise is suspected. No evidence of mass, frank hematoma, hydrocephalus or extra-axial collection. Minor petechial blood products present in the left parietooccipital junction region infarction. Vascular: Major vessels at the base of the brain show flow. Skull and upper cervical spine: Negative Sinuses/Orbits: Mucosal inflammatory changes of the paranasal sinuses. Orbits are negative.  MRA HEAD FINDINGS 07/17/2020 Both internal carotid arteries are widely patent through the skull base and siphon regions. The anterior and middle cerebral vessels are patent. 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 Fetal origin of the left PCA from the anterior circulation.  Dominant left vertebral artery widely patent to the basilar. Non dominant right vertebral artery shows atherosclerotic narrowing and irregularity with does give flow to the basilar. The basilar artery shows atherosclerotic irregularity but no stenosis. Superior cerebellar and posterior cerebral arteries show flow. As noted above, left PCA arises from the anterior circulation.  IMPRESSION: 1. Watershed pattern infarctions extending front to back affecting both hemispheres at the watershed regions. 3 cm acute infarction at the left parietooccipital junction was visible at CT mild petechial blood products in the left parietooccipital infarction region. No evidence of frank hematoma or mass effect. Findings most likely secondary to global hypoperfusion event. 2. Background pattern of mild chronic small-vessel ischemic change of the cerebral hemispheric white  matter elsewhere. 3. MR angiography does not show any large or medium vessel occlusion. There is atherosclerotic irregularity of the non dominant right vertebral artery and the basilar artery but no significant stenosis. 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 Fetal origin of the left PCA from the anterior circulation.   EEG adult  Result Date: 07/27/2020 Lora Havens, MD     07/27/2020  4:32 PM Patient Name: Benjamin Burke MRN: [id number] Epilepsy Attending: Lora Havens Referring Physician/Provider: Dr Lesleigh Noe Date: 07/27/2020 Duration: 24.22 mins Patient history: 60yo M with ams. EEG to evaluate for seizure. Level of alertness: comatose AEDs during EEG study: None Technical aspects: This EEG study was done with scalp electrodes positioned according to the 10-20 International system  of electrode placement. Electrical activity was acquired at a sampling rate of 500Hz  and reviewed with a high frequency filter of 70Hz  and a low frequency filter of 1Hz . EEG data were recorded continuously and digitally stored. Description: EEG showed continuous generalized 3 to 6 Hz theta-delta slowing. Hyperventilation and photic stimulation were not performed.   ABNORMALITY -Continuous slow, generalized IMPRESSION: This study is suggestive of moderate diffuse encephalopathy, nonspecific etiology. No seizures or epileptiform discharges were seen throughout the recording. Priyanka Barbra Sarks   DG FL GUIDED LUMBAR PUNCTURE  Result Date: 07/26/2020 CLINICAL DATA:  Altered mental status. Watershed infarction on MRI. Intubated patient. EXAM: DIAGNOSTIC LUMBAR PUNCTURE UNDER FLUOROSCOPIC GUIDANCE FLUOROSCOPY TIME:  Fluoroscopy Time:  48 seconds Radiation Exposure Index (if provided by the fluoroscopic device): 12.9 mGy Number of Acquired Spot Images: 1 PROCEDURE: Informed consent was obtained from the patient prior to the procedure, including potential complications of headache, allergy,  and pain. With the patient prone, the lower back was prepped with Betadine. 1% Lidocaine was used for local anesthesia. Lumbar puncture was performed at the L3-L4 level using a 20 gauge needle with return of clear CSF with an opening pressure of 38 cm water. Closing pressure equal approximately 15 cm water. Nine ml of CSF were obtained for laboratory studies. The patient tolerated the procedure well and there were no apparent complications. IMPRESSION: 1. Successful lumbar puncture. 2. Opening pressure equal 38 cm water. Electronically Signed   By: Suzy Bouchard M.D.   On: 07/26/2020 11:28   Transthoracic Echocardiogram  1. Left ventricular ejection fraction, by estimation, is 30 to 35%. The  left ventricle has moderately decreased function. The left ventricle  demonstrates global hypokinesis. Left ventricular diastolic parameters are  consistent with Grade I diastolic  dysfunction (impaired relaxation).  2. Right ventricular systolic function is normal. The right ventricular  size is normal.  3. The mitral valve is normal in structure. No evidence of mitral valve  regurgitation. No evidence of mitral stenosis.  4. The aortic valve is normal in structure. Aortic valve regurgitation is  not visualized. No aortic stenosis is present.  5. The inferior vena cava is normal in size with greater than 50%  respiratory variability, suggesting right atrial pressure of 3 mmHg.   Bilateral Carotid Dopplers  Right Carotid: Velocities in the right ICA are consistent with a 1-39%  stenosis. The ECA appears occluded.  Left Carotid: Velocities in the left ICA are consistent with a 1-39%  stenosis.  Vertebrals: Bilateral vertebral arteries demonstrate antegrade flow.   ECG - ST rate 112 BPM. (See cardiology reading for complete details)  PHYSICAL EXAM - Relatively unchanged compared to yesterday.  Temp:  [98.9 F (37.2 C)-100.22 F (37.9 C)] 99.68 F (37.6 C) (02/27 0530) Pulse Rate:  [59-98]  59 (02/27 0530) Resp:  [18-33] 22 (02/27 0530) BP: (86-186)/(58-118) 103/73 (02/27 0530) SpO2:  [91 %-100 %] 100 % (02/27 0722) FiO2 (%):  [40 %] 40 % (02/27 0722) Weight:  [83.3 kg] 83.3 kg (02/27 0500)  General - Well nourished, well developed, appears older than listed age, intubated on sedation.  Ophthalmologic - fundi not visualized.  Cardiovascular - Regular rhythm and rate at time of exam.  Neuro - intubated on Precedex, eyes closed not opening to voice and does not follow commands. With forced eye opening, eyes in mid position, not blinking to visual threat, doll's eyes present in all cardinal planes, not tracking, PERRL. Corneal reflex weakly present bilaterally, gag and cough present. Breathing over  the vent. Facial symmetry not able to test due to ET tube. Tongue protrusion not cooperative. Moves bilateral lower extremities spontaneously and purposefully and occasionally bilateral upper extremities however strength is significantly diminished in the uppers. DTR diminished and bilateral babinski. Sensation, coordination and gait not tested.    ASSESSMENT/PLAN Benjamin Burke is a 60 y.o. male with history of cocaine abuse, chronic back pain, hypertension, COPD and tobacco abuse, brought into the emergency room after he was  found down by family.  He did not receive IV t-PA due to late presentation (>4.5 hours from time of onset). Patient was agitated with "upward gaze"overnight 2/25-26/2022 during transition to precedex from propofol and Neuro consulted overnight and EEG ordered which did not show seizure or epileptiform discharges. Degree of encephalopathy is out of proportion to what is seen on imaging however the patient is sedated. Bilateral Babinski may be due to bilateral strokes however encephalopathy may be a confounder. Would prefer not to make too many concurrent medication changes which may confound neurologic exam.   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 However, cardioembolic source can not be ruled out, need TEE to rule out LV thrombus or endocarditis  CT head - Abnormal mixed hypo- and hyperdensity in the superior left occipital lobe encompassing roughly 3 cm. [name] this is an acute or subacute Left PCA territory infarct with petechial hemorrhage (questionable hyperdense left PCA)   MRI head - Watershed pattern infarctions extending front to back affecting both hemispheres at the watershed regions. 3 cm acute infarction at the left parietooccipital junction was visible at CT mild petechial blood products in the left parietooccipital infarction region.   MRA head - unremarkable, nondominant right VA with distal occlusion  Carotid Doppler - unremarkable  2D Echo - EF 30-35% but no vegetation  Recommend TEE to rule out LV thrombus or endocarditis  Lacey Jensen Virus 2 - negative  LDL - 60  HgbA1c - 6.1  UDS - Cocaine and THCU  VTE prophylaxis - heparin IV  aspirin 81 mg daily prior to admission, now on No antithrombotic given concerns of endocarditis  Ongoing aggressive stroke risk factor management  Therapy recommendations:  pending  Disposition:  Pending  Sepsis Fever  Leukocytosis  Tmax 100.8->101.3  Leukocytosis - WBC's - 15.6->20.8->16.7->14.9  Blood cultures NGTD  UA neg  CXR Persistent bilateral interstitial prominence, pneumonitis cannot be excluded.  CSF WBC 2, RBC 7, protein 31, glucose 82 - no CNS infection  On Vancomycin + Cefepime -> Rocephin  troponinemia    Trop 1605->6484->6476  EF 30-35%, no vegetation  CCM on board  Cardiology on board concerning for demand ischemia  Recommend TEE to rule out LV thrombus or endocarditis  Respiratory failure  Intubated on vent  CCM on board  Not extubate candidate now given mental status  AKI  creatinine -  2.70->2.48->1.68->1.03  Normalized  BMP monitoring  On IVF  Hypertension  Home BP meds: none   Current BP meds: none   Stable . Avoid low BP . Long-term BP goal normotensive  Hyperlipidemia  Home Lipid lowering medication: Lipitor 20 mg daily  LDL 60, goal < 70  Hold off statin due to elevated LFTs  AST/ALT 74/55 -> 45/60 consider statin at discharge.  Cocaine abuse   Hx of cocaine use  UDS positive for cocaine  Cessation education will be provided  Tobacco abuse  Current smoker  Smoking cessation counseling will be provided  Other Stroke Risk Factors  Family hx stroke (mother)   Substance abuse - THC positive, cessation education will be provided  Other Active Problems, Findings, Recommendations and/or Plan  Code status - Full code   Elevate LFTs  Hospital day # 4  This patient is critically ill due to sepsis, septic shock, stroke, elevated troponin, AKI, and at significant risk of neurological worsening, death form septic shock, endocarditis, recurrent stroke, heart failure, seizure. This patient's care requires constant monitoring of vital signs, hemodynamics, respiratory and cardiac monitoring, review of multiple databases, neurological assessment, discussion with family, other specialists and medical decision making of high complexity. I spent 35 minutes of neurocritical care time in the care of this patient. I have discussed with CCM resident.   Lynnae Sandhoff, MD Page: [phone] 07/28/2020 8:49 AM  To contact Stroke Continuity provider, please refer to [email or web address] After hours, contact General Neurology

## 2020-07-28 NOTE — Progress Notes (Signed)
PT Cancellation and Discharge Note  Patient Details Name: Benjamin Burke MRN: [id number] DOB: 09-Apr-1961   Cancelled Treatment:    Reason Eval/Treat Not Completed: Patient not medically ready;Patient's level of consciousness. Pt remains intubated. RN reports pt not medically ready and advised sign off at this time. Please re-consult when med status improves.   Benjamin Burke 07/28/2020, 8:21 AM   Lorrin Goodell, PT  Office # [phone] Pager [phone]

## 2020-07-28 NOTE — Progress Notes (Signed)
Cardiology Progress Note  Patient ID: Benjamin Burke MRN: [id number] DOB: 03-06-1961 Date of Encounter: 07/28/2020  Primary Cardiologist: No primary care provider on file.  Subjective   Chief Complaint: None.  On ventilator.  HPI: Not responsive to my examination.  Nursing reports mental status unchanged.  EEG with diffuse encephalopathy.  ROS:  All other ROS reviewed and negative. Pertinent positives noted in the HPI.     Inpatient Medications  Scheduled Meds: . atorvastatin  80 mg Per Tube Daily  . carvedilol  6.25 mg Oral BID WC  . chlorhexidine gluconate (MEDLINE KIT)  15 mL Mouth Rinse BID  . Chlorhexidine Gluconate Cloth  6 each Topical Daily  . docusate  100 mg Per Tube BID  . famotidine  20 mg Per Tube BID  . furosemide  40 mg Intravenous Q6H  . heparin injection (subcutaneous)  5,000 Units Subcutaneous Q8H  . losartan  25 mg Per Tube Daily  . mouth rinse  15 mL Mouth Rinse 10 times per day  . polyethylene glycol  17 g Per Tube Daily  . potassium chloride  40 mEq Per Tube Once  . potassium chloride  40 mEq Per Tube Once   Continuous Infusions: . cefTRIAXone (ROCEPHIN)  IV Stopped (07/27/20 1044)  . dexmedetomidine (PRECEDEX) IV infusion 0.8 mcg/kg/hr (07/28/20 0951)  . feeding supplement (VITAL AF 1.2 CAL) 1,000 mL (07/27/20 2250)  . fentaNYL infusion INTRAVENOUS 50 mcg/hr (07/28/20 0900)   PRN Meds: acetaminophen (TYLENOL) oral liquid 160 mg/5 mL, fentaNYL (SUBLIMAZE) injection   Vital Signs   Vitals:   07/28/20 0851 07/28/20 0900 07/28/20 0930 07/28/20 1028  BP: 129/73 (!) 159/86 (!) 167/96   Pulse: 76 81 88   Resp:  20 (!) 24   Temp:  100.04 F (37.8 C) 100.04 F (37.8 C)   TempSrc:      SpO2:  100% 98% 98%  Weight:      Height:        Intake/Output Summary (Last 24 hours) at 07/28/2020 1042 Last data filed at 07/28/2020 0900 Gross per 24 hour  Intake 2225.99 ml  Output 720 ml  Net 1505.99 ml   Last 3 Weights 07/28/2020 07/27/2020 07/26/2020   Weight (lbs) 183 lb 10.3 oz 181 lb 7 oz 178 lb 12.7 oz  Weight (kg) 83.3 kg 82.3 kg 81.1 kg      Telemetry  Overnight telemetry shows sinus rhythm in the 70s, which I personally reviewed.   ECG  The most recent ECG shows sinus tachycardia heart rate 112, poor R wave progression, which I personally reviewed.   Physical Exam   Vitals:   07/28/20 0851 07/28/20 0900 07/28/20 0930 07/28/20 1028  BP: 129/73 (!) 159/86 (!) 167/96   Pulse: 76 81 88   Resp:  20 (!) 24   Temp:  100.04 F (37.8 C) 100.04 F (37.8 C)   TempSrc:      SpO2:  100% 98% 98%  Weight:      Height:         Intake/Output Summary (Last 24 hours) at 07/28/2020 1042 Last data filed at 07/28/2020 0900 Gross per 24 hour  Intake 2225.99 ml  Output 720 ml  Net 1505.99 ml    Last 3 Weights 07/28/2020 07/27/2020 07/26/2020  Weight (lbs) 183 lb 10.3 oz 181 lb 7 oz 178 lb 12.7 oz  Weight (kg) 83.3 kg 82.3 kg 81.1 kg    Body mass index is 28.76 kg/m.   General: Intubated on vent Head:  Atraumatic, normal size  Eyes: No scleral icterus Neck: Supple, no JVD Endocrine: No thryomegaly Cardiac: Normal S1, S2; RRR; no murmurs, rubs, or gallops Lungs: Diminished breath sounds bilaterally Abd: Soft, nontender, no hepatomegaly  Ext: No edema, pulses 2+ Musculoskeletal: No deformities Skin: Warm and dry, no rashes   Neuro: Intubated on vent  Labs  High Sensitivity Troponin:   Recent Labs  Lab 07/13/2020 0937 07/14/2020 1247 07/03/2020 1515  TROPONINIHS 1,605* 6,484* 6,476*     Cardiac EnzymesNo results for input(s): TROPONINI in the last 168 hours. No results for input(s): TROPIPOC in the last 168 hours.  Chemistry Recent Labs  Lab 07/27/2020 [phone] 07/20/2020 1012 07/08/2020 1247 07/25/20 0126 07/26/20 0626 07/27/20 0119 07/28/20 0642  NA 140   < > 140   < > 139 138 141  K 5.4*   < > 5.1   < > 3.7 4.0 3.3*  CL 102   < > 106   < > 104 106 104  CO2 22  --  21*   < > $R'26 24 28  'nw$ GLUCOSE 141*   < > 148*   < > 117* 128*  141*  BUN 23*   < > 23*   < > $R'20 17 18  'mw$ CREATININE 2.76*   < > 2.48*   < > 1.03 0.87 0.77  CALCIUM 9.0  --  7.8*   < > 8.2* 8.3* 8.6*  PROT 7.5  --  6.6  --   --  5.9*  --   ALBUMIN 4.0  --  3.4*  --   --  2.6*  --   AST 50*  --  74*  --   --  45*  --   ALT 44  --  55*  --   --  60*  --   ALKPHOS 88  --  77  --   --  90  --   BILITOT 0.6  --  0.9  --   --  0.6  --   GFRNONAA 26*  --  30*   < > >60 >60 >60  ANIONGAP 16*  --  13   < > $R'9 8 9   'sm$ < > = values in this interval not displayed.    Hematology Recent Labs  Lab 07/26/20 0626 07/27/20 0119 07/28/20 0642  WBC 14.9* 11.7* 8.8  RBC 3.95* 3.98* 3.96*  HGB 12.1* 12.0* 12.0*  HCT 36.8* 36.4* 36.9*  MCV 93.2 91.5 93.2  MCH 30.6 30.2 30.3  MCHC 32.9 33.0 32.5  RDW 15.5 15.4 15.2  PLT 263 296 291   BNPNo results for input(s): BNP, PROBNP in the last 168 hours.  DDimer No results for input(s): DDIMER in the last 168 hours.   Radiology  EEG adult  Result Date: 07/27/2020 Lora Havens, MD     07/27/2020  4:32 PM Patient Name: Benjamin Burke MRN: [id number] Epilepsy Attending: Lora Havens Referring Physician/Provider: Dr Lesleigh Noe Date: 07/27/2020 Duration: 24.22 mins Patient history: 60yo M with ams. EEG to evaluate for seizure. Level of alertness: comatose AEDs during EEG study: None Technical aspects: This EEG study was done with scalp electrodes positioned according to the 10-20 International system of electrode placement. Electrical activity was acquired at a sampling rate of $Remov'500Hz'OQepBb$  and reviewed with a high frequency filter of $RemoveB'70Hz'lLhAuIuM$  and a low frequency filter of $RemoveB'1Hz'HnXTiPFd$ . EEG data were recorded continuously and digitally stored. Description: EEG showed continuous generalized 3 to 6 Hz theta-delta slowing. Hyperventilation and photic  stimulation were not performed.   ABNORMALITY -Continuous slow, generalized IMPRESSION: This study is suggestive of moderate diffuse encephalopathy, nonspecific etiology. No seizures or epileptiform  discharges were seen throughout the recording. Priyanka Barbra Sarks   DG FL GUIDED LUMBAR PUNCTURE  Result Date: 07/26/2020 CLINICAL DATA:  Altered mental status. Watershed infarction on MRI. Intubated patient. EXAM: DIAGNOSTIC LUMBAR PUNCTURE UNDER FLUOROSCOPIC GUIDANCE FLUOROSCOPY TIME:  Fluoroscopy Time:  48 seconds Radiation Exposure Index (if provided by the fluoroscopic device): 12.9 mGy Number of Acquired Spot Images: 1 PROCEDURE: Informed consent was obtained from the patient prior to the procedure, including potential complications of headache, allergy, and pain. With the patient prone, the lower back was prepped with Betadine. 1% Lidocaine was used for local anesthesia. Lumbar puncture was performed at the L3-L4 level using a 20 gauge needle with return of clear CSF with an opening pressure of 38 cm water. Closing pressure equal approximately 15 cm water. Nine ml of CSF were obtained for laboratory studies. The patient tolerated the procedure well and there were no apparent complications. IMPRESSION: 1. Successful lumbar puncture. 2. Opening pressure equal 38 cm water. Electronically Signed   By: Suzy Bouchard M.D.   On: 07/26/2020 11:28    Cardiac Studies  TTE 07/25/2020  1. Left ventricular ejection fraction, by estimation, is 30 to 35%. The  left ventricle has moderately decreased function. The left ventricle  demonstrates global hypokinesis. Left ventricular diastolic parameters are  consistent with Grade I diastolic  dysfunction (impaired relaxation).  2. Right ventricular systolic function is normal. The right ventricular  size is normal.  3. The mitral valve is normal in structure. No evidence of mitral valve  regurgitation. No evidence of mitral stenosis.  4. The aortic valve is normal in structure. Aortic valve regurgitation is  not visualized. No aortic stenosis is present.  5. The inferior vena cava is normal in size with greater than 50%  respiratory variability,  suggesting right atrial pressure of 3 mmHg.   Patient Profile  Benjamin Burke is a 60 y.o. male with tobacco abuse, hypertension, COPD, cocaine abuse who was admitted on 07/28/2020 after being found unresponsive.  He was brought to the hospital for sepsis and intubated for airway protection.  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  Cardiology consulted for new onset cardiomyopathy and elevated troponin.  Assessment & Plan   1.  Acute stroke -Bilateral watershed infarcts. -Plan for transesophageal echocardiogram tomorrow to rule out LV thrombus and/or endocarditis. -N.p.o. at midnight.  Please turn tube feeds off at this time.  2.  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  Apparently there is cocaine use present.  Suspect his cardiomyopathy is related to his drug abuse. -Troponins were elevated but likely related to cocaine use.  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 -Increase Coreg to 12.5 twice a day.  Continue losartan 25 mg daily.  Aldactone 25 mg daily added today. -Further titration of medical therapy pending on overall prognosis.  Prognosis is guarded.  We will see how he does.  3.  Elevated troponin, likely demand ischemia -Admitted with a new onset cardiomyopathy that is global.  No clear ischemic changes but old anteroseptal infarct.  His LV dysfunction is out of proportion to his troponin elevation.  He could have had coronary vasospasm the triggering event.  I suspect his cardiomyopathy is related to his drug abuse. -Start aspirin when able per neurology.  Continue statin.  On a beta-blocker.  No heparin given recent strokes.  For questions or updates, please contact [place] Please consult [email or web address] for contact info under   Time Spent with Patient: I have spent a total of 25 minutes with patient reviewing hospital notes, telemetry, EKGs, labs and examining the patient as well as establishing an  assessment and plan that was discussed with the patient.  > 50% of time was spent in direct patient care.    Signed, Addison Naegeli. Audie Box, [place]  07/28/2020 10:42 AM

## 2020-07-28 NOTE — Progress Notes (Signed)
   Plan is for TEE tomorrow for further evaluation of stroke. Patient is still intubated and sedated. Per chart review, Roby Lofts, PA-C, spoke with patient sister Jonluke Cobbins, who is reportedly his healthcare power of attorney although she cannot find the paperwork, on 07/26/2020 and sister consented for TEE. I did call and speak with Wells Guiles again today who confirmed again that she consents for this procedure. RN confirmed consent over the phone. We both signed consent form and placed in paper chart.  Procedure will be done sometime tomorrow at bedside. Will place orders. Will place Care Order for tube feeds to be held at midnight.   Darreld Mclean, PA-C 07/28/2020 10:50 AM

## 2020-07-29 ENCOUNTER — Inpatient Hospital Stay (HOSPITAL_COMMUNITY): Payer: Medicare Other

## 2020-07-29 DIAGNOSIS — I6300 Cerebral infarction due to thrombosis of unspecified precerebral artery: Secondary | ICD-10-CM | POA: Diagnosis not present

## 2020-07-29 DIAGNOSIS — I5021 Acute systolic (congestive) heart failure: Secondary | ICD-10-CM | POA: Diagnosis not present

## 2020-07-29 DIAGNOSIS — J9601 Acute respiratory failure with hypoxia: Secondary | ICD-10-CM

## 2020-07-29 DIAGNOSIS — N179 Acute kidney failure, unspecified: Secondary | ICD-10-CM | POA: Diagnosis not present

## 2020-07-29 LAB — MISC LABCORP TEST (SEND OUT): Labcorp test code: 985

## 2020-07-29 LAB — CBC WITH DIFFERENTIAL/PLATELET
Abs Immature Granulocytes: 0.1 10*3/uL — ABNORMAL HIGH (ref 0.00–0.07)
Basophils Absolute: 0.1 10*3/uL (ref 0.0–0.1)
Basophils Relative: 1 %
Eosinophils Absolute: 0.2 10*3/uL (ref 0.0–0.5)
Eosinophils Relative: 2 %
HCT: 34.3 % — ABNORMAL LOW (ref 39.0–52.0)
Hemoglobin: 11.4 g/dL — ABNORMAL LOW (ref 13.0–17.0)
Immature Granulocytes: 1 %
Lymphocytes Relative: 17 %
Lymphs Abs: 1.8 10*3/uL (ref 0.7–4.0)
MCH: 30.6 pg (ref 26.0–34.0)
MCHC: 33.2 g/dL (ref 30.0–36.0)
MCV: 92.2 fL (ref 80.0–100.0)
Monocytes Absolute: 1.9 10*3/uL — ABNORMAL HIGH (ref 0.1–1.0)
Monocytes Relative: 18 %
Neutro Abs: 6.4 10*3/uL (ref 1.7–7.7)
Neutrophils Relative %: 61 %
Platelets: 349 10*3/uL (ref 150–400)
RBC: 3.72 MIL/uL — ABNORMAL LOW (ref 4.22–5.81)
RDW: 15.1 % (ref 11.5–15.5)
WBC: 10.4 10*3/uL (ref 4.0–10.5)
nRBC: 0 % (ref 0.0–0.2)

## 2020-07-29 LAB — COMPREHENSIVE METABOLIC PANEL
ALT: 74 U/L — ABNORMAL HIGH (ref 0–44)
AST: 51 U/L — ABNORMAL HIGH (ref 15–41)
Albumin: 2.2 g/dL — ABNORMAL LOW (ref 3.5–5.0)
Alkaline Phosphatase: 124 U/L (ref 38–126)
Anion gap: 10 (ref 5–15)
BUN: 26 mg/dL — ABNORMAL HIGH (ref 6–20)
CO2: 30 mmol/L (ref 22–32)
Calcium: 8.9 mg/dL (ref 8.9–10.3)
Chloride: 102 mmol/L (ref 98–111)
Creatinine, Ser: 0.91 mg/dL (ref 0.61–1.24)
GFR, Estimated: >60 mL/min (ref >60)
Glucose, Bld: 119 mg/dL — ABNORMAL HIGH (ref 70–99)
Potassium: 3.9 mmol/L (ref 3.5–5.1)
Sodium: 142 mmol/L (ref 135–145)
Total Bilirubin: 0.8 mg/dL (ref 0.3–1.2)
Total Protein: 5.8 g/dL — ABNORMAL LOW (ref 6.5–8.1)

## 2020-07-29 LAB — GLUCOSE, CAPILLARY
Glucose-Capillary: 99 mg/dL (ref 70–99)
Glucose-Capillary: 104 mg/dL — ABNORMAL HIGH (ref 70–99)
Glucose-Capillary: 133 mg/dL — ABNORMAL HIGH (ref 70–99)
Glucose-Capillary: 98 mg/dL (ref 70–99)
Glucose-Capillary: 112 mg/dL — ABNORMAL HIGH (ref 70–99)
Glucose-Capillary: 107 mg/dL — ABNORMAL HIGH (ref 70–99)

## 2020-07-29 LAB — HSV CULTURE AND TYPING

## 2020-07-29 LAB — CSF CULTURE W GRAM STAIN
Culture: NO GROWTH
Gram Stain: NONE SEEN

## 2020-07-29 LAB — CULTURE, BLOOD (ROUTINE X 2)
Culture: NO GROWTH
Culture: NO GROWTH

## 2020-07-29 MED ORDER — MIDAZOLAM HCL 2 MG/2ML IJ SOLN
INTRAMUSCULAR | Status: AC
  Administered 2020-07-29: 2 mg via INTRAVENOUS
  Filled 2020-07-29: qty 2

## 2020-07-29 MED ORDER — MIDAZOLAM HCL 2 MG/2ML IJ SOLN
2.0000 mg | INTRAMUSCULAR | Status: AC
  Filled 2020-07-29: qty 2

## 2020-07-29 MED ORDER — DEXMEDETOMIDINE HCL IN NACL 400 MCG/100ML IV SOLN
0.0000 ug/kg/h | INTRAVENOUS | Status: DC
  Administered 2020-07-29 – 2020-07-30 (×2): 0.7 ug/kg/h via INTRAVENOUS
  Administered 2020-07-30: 0.8 ug/kg/h via INTRAVENOUS
  Administered 2020-07-30: 0.7 ug/kg/h via INTRAVENOUS
  Administered 2020-07-30: 1 ug/kg/h via INTRAVENOUS
  Administered 2020-07-31 – 2020-08-01 (×9): 1.2 ug/kg/h via INTRAVENOUS
  Filled 2020-07-29 (×8): qty 100
  Filled 2020-07-29: qty 200
  Filled 2020-07-29: qty 100
  Filled 2020-07-29: qty 200
  Filled 2020-07-29: qty 100

## 2020-07-29 NOTE — Progress Notes (Addendum)
STROKE TEAM PROGRESS NOTE   INTERVAL HISTORY No family is at the bedside. Pt remains intubated for respiratory failure and heart failure., on sedation improved leukocytosis. LP not consistent with CNS infection.  Now off antibiotics.. Pending TEE . EEG.  No neurological changes.  Remains heavily sedated for comfort as he gets agitated when sedation reduced. OBJECTIVE Vitals:   07/29/20 1200 07/29/20 1230 07/29/20 1300 07/29/20 1330  BP: (!) 145/88 123/78 117/74 112/71  Pulse: 76 67 66 64  Resp: 17 (!) 21 (!) 22 (!) 22  Temp: 99.5 F (37.5 C) 99.32 F (37.4 C) 99.32 F (37.4 C) 99.32 F (37.4 C)  TempSrc:      SpO2: 98% 95% 96% 96%  Weight:      Height:       CBC:  Recent Labs  Lab 07/27/2020 0937 07/26/2020 1012 07/28/20 0642 07/29/20 0151  WBC 15.6*   < > 8.8 10.4  NEUTROABS 12.6*  --   --  6.4  HGB 17.0   < > 12.0* 11.4*  HCT 51.5   < > 36.9* 34.3*  MCV 92.8   < > 93.2 92.2  PLT 365   < > 291 349   < > = values in this interval not displayed.   Basic Metabolic Panel:  Recent Labs  Lab 07/26/20 0509 07/26/20 0626 07/26/20 1621 07/26/20 1940 07/27/20 0119 07/28/20 0642 07/29/20 0151  NA  --    < >  --   --    < > 141 142  K  --    < >  --   --    < > 3.3* 3.9  CL  --    < >  --   --    < > 104 102  CO2  --    < >  --   --    < > 28 30  GLUCOSE  --    < >  --   --    < > 141* 119*  BUN  --    < >  --   --    < > 18 26*  CREATININE  --    < >  --   --    < > 0.77 0.91  CALCIUM  --    < >  --   --    < > 8.6* 8.9  MG 2.2  --  2.0  --   --   --   --   PHOS 1.6*  --  2.9 3.3  --   --   --    < > = values in this interval not displayed.    Lipid Panel:     Component Value Date/Time   CHOL 116 07/25/2020 0126   TRIG 75 07/28/2020 0642   HDL 36 (L) 07/25/2020 0126   CHOLHDL 3.2 07/25/2020 0126   VLDL 20 07/25/2020 0126   LDLCALC 60 07/25/2020 0126   HgbA1c:  Lab Results  Component Value Date   HGBA1C 6.1 (H) 07/25/2020   Urine Drug Screen:     Component  Value Date/Time   LABOPIA NONE DETECTED 07/22/2020 1025   COCAINSCRNUR POSITIVE (A) 07/08/2020 1025   LABBENZ NONE DETECTED 07/23/2020 1025   AMPHETMU NONE DETECTED 07/23/2020 1025   THCU POSITIVE (A) 07/17/2020 1025   LABBARB NONE DETECTED 07/26/2020 1025    Alcohol Level     Component Value Date/Time   ETH <10 07/08/2020 0939    IMAGING CT HEAD WITHOUT  CONTRAST 07/23/2020 TECHNIQUE: Contiguous axial images were obtained from the base of the skull through the vertex without intravenous contrast. COMPARISON:  None. FINDINGS: Brain: Patchy bilateral fairly symmetric white matter hypodensity in both hemispheres. Confluent asymmetric mixed hypo and hyperdensity in the superior left occipital pole, series 3, image 14 and coronal image 63. No similar cortical hypodensity elsewhere. Deep gray nuclei and posterior fossa gray-white differentiation remains within normal limits. No associated intracranial mass effect. No midline shift. No ventriculomegaly. No extra-axial hemorrhage identified. Vascular: Mild Calcified atherosclerosis at the skull base. Suggestion of fetal type left PCA origin. Questionable hyperdense left PCA P2 are P3 segment on series 3, image 15. No other suspicious intracranial vascular hyperdensity. Skull: Negative. Sinuses/Orbits: Right nasal airway in place. Small volume retained secretions in the nasopharynx. Mild mucosal thickening and bubbly opacity in the left ethmoid and maxillary sinuses. Tympanic cavities and mastoids are clear. Other: Visualized orbits and scalp soft tissues are within normal limits. IMPRESSION: 1. Abnormal mixed hypo- and hyperdensity in the superior left occipital lobe encompassing roughly 3 cm. [name] this is an acute or subacute Left PCA territory infarct with petechial hemorrhage (questionable hyperdense left PCA) - but recommend Head MRI (without contrast may suffice) to confirm. 2. No significant intracranial mass effect. Superimposed age  advanced bilateral white matter changes, most commonly due to chronic small vessel disease. 3. Right nasal airway in place. Mild left paranasal sinus inflammation.  MRI brain 07/22/2020 No acute infarction affects the brainstem or cerebellum. 3 cm acute infarction at the left parietooccipital junction on the left. Watershed pattern infarctions extending front to back affecting both hemispheres at the watershed regions. Cortical and white matter involvement as is typical. Mild chronic small-vessel ischemic change of the cerebral hemispheric white matter otherwise is suspected. No evidence of mass, frank hematoma, hydrocephalus or extra-axial collection. Minor petechial blood products present in the left parietooccipital junction region infarction. Vascular: Major vessels at the base of the brain show flow. Skull and upper cervical spine: Negative Sinuses/Orbits: Mucosal inflammatory changes of the paranasal sinuses. Orbits are negative.  MRA HEAD FINDINGS 07/02/2020 Both internal carotid arteries are widely patent through the skull base and siphon regions. The anterior and middle cerebral vessels are patent. 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 Fetal origin of the left PCA from the anterior circulation.  Dominant left vertebral artery widely patent to the basilar. Non dominant right vertebral artery shows atherosclerotic narrowing and irregularity with does give flow to the basilar. The basilar artery shows atherosclerotic irregularity but no stenosis. Superior cerebellar and posterior cerebral arteries show flow. As noted above, left PCA arises from the anterior circulation.  IMPRESSION: 1. Watershed pattern infarctions extending front to back affecting both hemispheres at the watershed regions. 3 cm acute infarction at the left parietooccipital junction was visible at CT mild petechial blood products in the left parietooccipital infarction region. No evidence of frank  hematoma or mass effect. Findings most likely secondary to global hypoperfusion event. 2. Background pattern of mild chronic small-vessel ischemic change of the cerebral hemispheric white matter elsewhere. 3. MR angiography does not show any large or medium vessel occlusion. There is atherosclerotic irregularity of the non dominant right vertebral artery and the basilar artery but no significant stenosis. 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 Fetal origin of the left PCA from the anterior circulation.   DG Chest Port 1 View  Result Date: 07/29/2020 CLINICAL DATA:  Respiratory failure EXAM: PORTABLE CHEST 1 VIEW COMPARISON:  July 25, 2020 FINDINGS: The cardiomediastinal silhouette is unchanged in contour.ETT tip terminates 4.4 cm above the carina. Feeding tube tip terminates over the stomach. No pleural effusion. No pneumothorax. Unchanged postsurgical changes of the RIGHT apex. Mild diffuse interstitial prominence with central vascular congestion, similar in comparison to prior. Low lung volumes. Visualized abdomen is unremarkable. No acute osseous abnormality. IMPRESSION: 1.  Support apparatus as described above. 2. Persistent diffuse interstitial prominence likely reflecting pulmonary edema. Electronically Signed   By: Valentino Saxon MD   On: 07/29/2020 07:55   EEG adult  Result Date: 07/27/2020 Lora Havens, MD     07/27/2020  4:32 PM Patient Name: Benjamin Burke MRN: [id number] Epilepsy Attending: Lora Havens Referring Physician/Provider: Dr Lesleigh Noe Date: 07/27/2020 Duration: 24.22 mins Patient history: 60yo M with ams. EEG to evaluate for seizure. Level of alertness: comatose AEDs during EEG study: None Technical aspects: This EEG study was done with scalp electrodes positioned according to the 10-20 International system of electrode placement. Electrical activity was acquired at a sampling rate of 500Hz  and reviewed with a high frequency  filter of 70Hz  and a low frequency filter of 1Hz . EEG data were recorded continuously and digitally stored. Description: EEG showed continuous generalized 3 to 6 Hz theta-delta slowing. Hyperventilation and photic stimulation were not performed.   ABNORMALITY -Continuous slow, generalized IMPRESSION: This study is suggestive of moderate diffuse encephalopathy, nonspecific etiology. No seizures or epileptiform discharges were seen throughout the recording. Priyanka O Yadav   Transthoracic Echocardiogram  1. Left ventricular ejection fraction, by estimation, is 30 to 35%. The  left ventricle has moderately decreased function. The left ventricle  demonstrates global hypokinesis. Left ventricular diastolic parameters are  consistent with Grade I diastolic  dysfunction (impaired relaxation).  2. Right ventricular systolic function is normal. The right ventricular  size is normal.  3. The mitral valve is normal in structure. No evidence of mitral valve  regurgitation. No evidence of mitral stenosis.  4. The aortic valve is normal in structure. Aortic valve regurgitation is  not visualized. No aortic stenosis is present.  5. The inferior vena cava is normal in size with greater than 50%  respiratory variability, suggesting right atrial pressure of 3 mmHg.   Bilateral Carotid Dopplers  Right Carotid: Velocities in the right ICA are consistent with a 1-39%  stenosis. The ECA appears occluded.  Left Carotid: Velocities in the left ICA are consistent with a 1-39%  stenosis.  Vertebrals: Bilateral vertebral arteries demonstrate antegrade flow.   ECG - ST rate 112 BPM. (See cardiology reading for complete details)  PHYSICAL EXAM - Relatively unchanged compared to yesterday.  Temp:  [97.3 F (36.3 C)-100.9 F (38.3 C)] 99.32 F (37.4 C) (02/28 1330) Pulse Rate:  [56-82] 64 (02/28 1330) Resp:  [0-25] 22 (02/28 1330) BP: (88-189)/(59-136) 112/71 (02/28 1330) SpO2:  [94 %-100 %] 96 % (02/28  1330) FiO2 (%):  [40 %] 40 % (02/28 1121) Weight:  [82.7 kg] 82.7 kg (02/28 0441)  General - Well nourished, well developed middle-aged Caucasian male, appears older than listed age, intubated on sedation.  Ophthalmologic - fundi not visualized.  Cardiovascular - Regular rhythm and rate at time of exam.  Neuro - intubated on Precedex, eyes closed not opening to voice and does not follow commands. With forced eye opening, eyes tonic upgaze deviation in mid position, not blinking to visual threat, doll's eyes present in all cardinal planes, not tracking, PERRL. Corneal reflex weakly present bilaterally, gag and cough  present. Breathing over the vent. Facial symmetry not able to test due to ET tube. Tongue protrusion not cooperative. Moves bilateral lower extremities spontaneously and semi purposefully and trace bilateral upper extremities however strength is significantly diminished in the uppers. DTR diminished and bilateral babinski. Sensation, coordination and gait not tested.    ASSESSMENT/PLAN Benjamin Burke is a 60 y.o. male with history of cocaine abuse, chronic back pain, hypertension, COPD and tobacco abuse, brought into the emergency room after he was  found down by family.  He did not receive IV t-PA due to late presentation (>4.5 hours from time of onset). Patient was agitated with "upward gaze"overnight 2/25-26/2022 during transition to precedex from propofol and Neuro consulted overnight and EEG ordered which did not show seizure or epileptiform discharges. Degree of encephalopathy is out of proportion to what is seen on imaging however the patient is sedated. Bilateral Babinski may be due to bilateral strokes however encephalopathy may be a confounder. Would prefer not to make too many concurrent medication changes which may confound neurologic exam.   Stroke: b/l MCA/ACA, MCA/PCA watershed and b/l MCA punctate infarcts, findings concerning global hypoperfusion event in the setting  of suspected sepsis and AKI. 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 [name] this is an acute or subacute Left PCA territory infarct with petechial hemorrhage (questionable hyperdense left PCA)   MRI head - Watershed pattern infarctions extending front to back affecting both hemispheres at the watershed regions. 3 cm acute infarction at the left parietooccipital junction was visible at CT mild petechial blood products in the left parietooccipital infarction region.   MRA head - unremarkable, nondominant right VA with distal occlusion  Carotid Doppler - unremarkable  2D Echo - EF 30-35% but no vegetation  Recommend TEE to rule out LV thrombus or endocarditis  Lacey Jensen Virus 2 - negative  LDL - 60  HgbA1c - 6.1  UDS - Cocaine and THCU  VTE prophylaxis - heparin IV  aspirin 81 mg daily prior to admission, now on No antithrombotic given concerns of endocarditis  Ongoing aggressive stroke risk factor management  Therapy recommendations:  pending  Disposition:  Pending  Sepsis ruled out Fever  Leukocytosis  Tmax 100.8->101.3  Leukocytosis - WBC's - 15.6->20.8->16.7->14.9  Blood cultures NGTD  UA neg  CXR Persistent bilateral interstitial prominence, pneumonitis cannot be excluded.  CSF WBC 2, RBC 7, protein 31, glucose 82 - no CNS infection  On Vancomycin + Cefepime -> Rocephin  troponinemia    Trop 1605->6484->6476  EF 30-35%, no vegetation  CCM on board  Cardiology on board concerning for demand ischemia  Recommend TEE to rule out LV thrombus or endocarditis  Respiratory failure  Intubated on vent  CCM on board  Not extubate candidate now given mental status  AKI  creatinine - 2.70->2.48->1.68->1.03  Normalized  BMP  monitoring  On IVF  Hypertension  Home BP meds: none   Current BP meds: none   Stable . Avoid low BP . Long-term BP goal normotensive  Hyperlipidemia  Home Lipid lowering medication: Lipitor 20 mg daily  LDL 60, goal < 70  Hold off statin due to elevated LFTs  AST/ALT 74/55 -> 45/60 consider statin at discharge.  Cocaine abuse   Hx of cocaine use  UDS positive for cocaine  Cessation education will be provided  Tobacco abuse  Current smoker  Smoking cessation counseling will be provided  Other Stroke Risk Factors  Family hx stroke (mother)   Substance abuse - THC positive, cessation education will be provided  Other Active Problems, Findings, Recommendations and/or Plan  Code status - Full code   Elevate LFTs  Hospital day # 5 Patient remains heavily sedated for comfort and management of his respiratory failure.  TEE schedule hopefully in the next day or 2 to guide management.  Neuro exam is limited due to sedation but prognosis apperas poor..  Continue ongoing management as per critical care team This patient is critically ill due to sepsis, septic shock, stroke, elevated troponin, AKI, and at significant risk of neurological worsening, death form septic shock, endocarditis, recurrent stroke, heart failure, seizure. This patient's care requires constant monitoring of vital signs, hemodynamics, respiratory and cardiac monitoring, review of multiple databases, neurological assessment, discussion with family, other specialists and medical decision making of high complexity. I spent 32 minutes of neurocritical care time in the care of this patient. I have discussed with Dr. Kathalene Frames critical care medicine  Antony Contras, MD 07/29/2020 2:11 PM  To contact Stroke Continuity provider, please refer to [email or web address] After hours, contact General Neurology

## 2020-07-29 NOTE — Progress Notes (Signed)
NAME:  Benjamin Burke, MRN:  [id number], DOB:  1960/08/06, LOS: 5 ADMISSION DATE:  07/13/2020, CONSULTATION DATE:  2/23 REFERRING MD:  EDP, CHIEF COMPLAINT:  Found down   Brief History:  60 y/o male admitted after found nearly unresponsive by family, had fever, had been using cocaine the night before.   Past Medical History:  Chronic back pain Cocaine abuse COPD Hypertension Poor dentition History of spontaneous pneumothorax Tobacco abuse  Significant Hospital Events:    Consults:  CCM Neurology  Procedures:  2/23 ET>   Significant Diagnostic Tests:  CT head 2/23-abnormal mixed hypo and hyper densities in the superior left occipital lobe encompassing roughly 3 cm.  [name] an acute or subacute left PCA territory infarct with petechial hemorrhage but recommend had an MRI.  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 3 cm acute infarction at the left parietooccipital junction was visible at CT mild petechial blood products in the left parietooccipital infarction region. No evidence of frank hematoma or mass effect. Findings most likely secondary to global hypoperfusion event.  2/24 TTE LVEF 54-00%, RV systolic function normal, valves OK  Micro Data:  2/23 2023 blood culture x2>> no growth 1 day 07/24/2021 sputum culture 07/24/2021 urine culture  Antimicrobials:  07/21/2020 vancomycin x1 07/16/2020 cefepime x1 07/02/2020 Flagyl x1 2/24 ceftriaxone > 2/27  Interim History / Subjective:  No acute events over the last 24 hours.   Objective   Blood pressure 101/68, pulse 62, temperature 98.4 F (36.9 C), resp. rate (!) 0, height 5' 7.01" (1.702 m), weight 82.7 kg, SpO2 97 %.    Vent Mode: PRVC FiO2 (%):  [40 %] 40 % Set Rate:  [22 bmp] 22 bmp Vt Set:  [530 mL] 530 mL PEEP:  [5 cmH20] 5 cmH20 Pressure Support:  [10 cmH20] 10 cmH20 Plateau Pressure:  [8 cmH20-19 cmH20] 19 cmH20    Intake/Output Summary (Last 24 hours) at 07/29/2020 8676 Last data filed at 07/29/2020 0600 Gross per 24 hour  Intake 2281.64 ml  Output 3525 ml  Net -1243.36 ml   Filed Weights   07/27/20 0418 07/28/20 0500 07/29/20 0441  Weight: 82.3 kg 83.3 kg 82.7 kg    Examination: General: In bed, sedated on the vent HENT: NCAT ETT in place PULM: CTA B, vent supported breathing CV: RRR, no mgr GI: BS+, soft, nontender MSK: normal bulk and tone Neuro: on vent, opens eyes to loud sounds, moves lower extremities but does not move upper extremities, no purposeful movements   Resolved Hospital Problem list     Assessment & Plan:  Acute metabolic encephalopathy  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 > presumably related to hypoperfusion in setting of cardiac arrhythmia Continue to minimize sedation as able F/u neurology recommendations RASS target 0 Plan for TEE today 2/28 per neuro's request  Aspiration pneumonia Has completed course of ceftriaxone  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 Silverio Lay and the patient's sister Summary of discussion: full scope of practice  Follow up goals of care discussion due: Goals of care discussion on 2/28 at 1600 with Dr. Lake Bells and extended family Code Status: full  Labs   CBC: Recent Labs  Lab 07/23/2020 0937 07/02/2020 1012 07/25/20 0126 07/25/20 0610 07/26/20 0626  07/27/20 0119 07/28/20 0642 07/29/20 0151  WBC 15.6*   < > 16.7*  --  14.9* 11.7* 8.8 10.4  NEUTROABS 12.6*  --   --   --   --   --   --  6.4  HGB 17.0   < > 15.2 13.9 12.1* 12.0* 12.0* 11.4*  HCT 51.5   < > 48.6 41.0 36.8* 36.4* 36.9* 34.3*  MCV 92.8   < > 94.2  --  93.2 91.5 93.2 92.2  PLT 365   < > 305  --  263 296 291 349   < > = values in this interval not displayed.    Basic Metabolic Panel: Recent Labs  Lab 07/04/2020 1247 07/25/20 0126 07/25/20 0610 07/25/20 1629 07/26/20 0509 07/26/20 0626 07/26/20 1621 07/26/20 1940 07/27/20 0119 07/28/20 0642 07/29/20 0151  NA 140 141 140  --   --  139  --   --  138 141 142  K 5.1 4.5 4.2  --   --  3.7  --   --  4.0 3.3* 3.9  CL 106 108  --   --   --  104  --   --  106 104 102  CO2 21* 24  --   --   --  26  --   --  24 28 30   GLUCOSE 148* 128*  --   --   --  117*  --   --  128* 141* 119*  BUN 23* 24*  --   --   --  20  --   --  17 18 26*  CREATININE 2.48* 1.68*  --   --   --  1.03  --   --  0.87 0.77 0.91  CALCIUM 7.8* 8.3*  --   --   --  8.2*  --   --  8.3* 8.6* 8.9  MG 1.7 2.7*  --  2.1 2.2  --  2.0  --   --   --   --   PHOS 5.0* 2.2*  --  1.6* 1.6*  --  2.9 3.3  --   --   --    GFR: Estimated Creatinine Clearance: 92.1 mL/min (by C-G formula based on SCr of 0.91 mg/dL). Recent Labs  Lab 07/04/2020 0938 07/25/2020 1247 07/23/2020 1515 07/25/20 0126 07/26/20 0626 07/27/20 0119 07/28/20 0642 07/29/20 0151  PROCALCITON  --  7.20  --   --   --   --   --   --   WBC  --  20.8*  --    < > 14.9* 11.7* 8.8 10.4  LATICACIDVEN 3.0* 3.3* 3.2*  --   --   --   --   --    < > = values in this interval not displayed.    Liver Function Tests: Recent Labs  Lab 07/23/2020 6568 07/23/2020 1247 07/27/20 0119 07/29/20 0151  AST 50* 74* 45* 51*  ALT 44 55* 60* 74*  ALKPHOS 88 77 90 124  BILITOT 0.6 0.9 0.6 0.8  PROT 7.5 6.6 5.9* 5.8*  ALBUMIN 4.0 3.4* 2.6* 2.2*   Recent Labs  Lab 07/22/2020 1247  LIPASE 32  AMYLASE 614*   Recent  Labs  Lab 07/08/2020 [phone]  AMMONIA 43*    ABG    Component Value Date/Time   PHART 7.346 (L) 07/25/2020 0610   PCO2ART 42.1 07/25/2020 0610   PO2ART 76 (L) 07/25/2020 0610   HCO3 23.0 07/25/2020 0610   TCO2 24 07/25/2020 0610   ACIDBASEDEF 3.0 (H) 07/25/2020 0610   O2SAT 94.0 07/25/2020 0610     Coagulation Profile: Recent Labs  Lab 07/07/2020 0937 07/02/2020 1247  INR 1.1 1.1    Cardiac Enzymes: Recent Labs  Lab 07/14/2020 0937  CKTOTAL 294    HbA1C: Hgb A1c MFr Bld  Date/Time Value Ref Range Status  07/25/2020 01:26 AM 6.1 (H) 4.8 - 5.6 % Final    Comment:    (NOTE) Pre diabetes:          5.7%-6.4%  Diabetes:              >6.4%  Glycemic control for   <7.0% adults with diabetes     CBG: Recent Labs  Lab 07/28/20 1128 07/28/20 1540 07/28/20 2003 07/28/20 2340 07/29/20 0341  GLUCAP 123* 110* 89 165* 99     Critical care time: 31 minutes    Gifford Shave, MD  PGY-2, [facility]  07/29/20  8:19 AM If no response, please call [phone] until 7pm After 7:00 pm call Elink  [phone]

## 2020-07-29 NOTE — Progress Notes (Signed)
Cardiology  Attempted TEE today.  Patient intubated, sedated With Precedex.    Pt given bolus of fentanyl and also IV Versed.  BP marginal (99/)  Unable to put bite guard in mouth as pt clenching. Attempted to put TEE probe between teeth.   Unable to advance into mouth however    Discussed with B McQuaid.   Could paralyze pt transiently, but given borderline hemodynamics and current overall clnical state, he agreed to postpone request.     Please contact if oral access improves.  Can perform TEE at that time.    Dorris Carnes MD

## 2020-07-29 NOTE — Plan of Care (Signed)
  Interdisciplinary Goals of Care Family Meeting   Date carried out:: 07/29/2020  Location of the meeting: Bedside  Member's involved: Physician, Bedside Registered Nurse and Family Member or next of kin  Durable Power of Attorney or acting medical decision maker: Sister Neoma Laming, brother Rush Landmark and nieces  Discussion: We discussed goals of care for [employer] .  We discussed his condition this afternoon.  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  We discussed the fact that while I cannot tell them exactly what he would look like in 3-4 months, if they wished to wait to see if there would be neurologic improvement he would need a tracheostomy and PEG tube.  There is some uncertainty in the family as to what they think he would want.  We discussed the details of a trach and peg in detail and I explained that I feel it is unlikely he will ever regain any normal functional status.  They agreed that his CODE STATUS should be DNR and they plan to discuss trach and peg tonight and let me know how they want to proceed.  Code status: Full DNR  Disposition: Continue current acute care  Time spent for the meeting: 40 minutes  Roselie Awkward 07/29/2020, 5:07 PM

## 2020-07-29 NOTE — Progress Notes (Signed)
SLP Cancellation Note  Patient Details Name: Benjamin Burke MRN: [id number] DOB: 11-03-1960   Cancelled treatment:       Reason Eval/Treat Not Completed: Patient not medically ready (Pt intubated at this time. SLP will f/u)  Tobie Poet I. Hardin Negus, [place], [place] Office number [phone] Pager [place] 07/29/2020, 8:29 AM

## 2020-07-30 DIAGNOSIS — J9601 Acute respiratory failure with hypoxia: Secondary | ICD-10-CM | POA: Diagnosis not present

## 2020-07-30 DIAGNOSIS — Z7189 Other specified counseling: Secondary | ICD-10-CM

## 2020-07-30 DIAGNOSIS — Z515 Encounter for palliative care: Secondary | ICD-10-CM | POA: Diagnosis not present

## 2020-07-30 DIAGNOSIS — I5021 Acute systolic (congestive) heart failure: Secondary | ICD-10-CM | POA: Diagnosis not present

## 2020-07-30 DIAGNOSIS — Z66 Do not resuscitate: Secondary | ICD-10-CM

## 2020-07-30 DIAGNOSIS — I6300 Cerebral infarction due to thrombosis of unspecified precerebral artery: Secondary | ICD-10-CM | POA: Diagnosis not present

## 2020-07-30 LAB — GLUCOSE, CAPILLARY
Glucose-Capillary: 109 mg/dL — ABNORMAL HIGH (ref 70–99)
Glucose-Capillary: 110 mg/dL — ABNORMAL HIGH (ref 70–99)
Glucose-Capillary: 125 mg/dL — ABNORMAL HIGH (ref 70–99)
Glucose-Capillary: 136 mg/dL — ABNORMAL HIGH (ref 70–99)
Glucose-Capillary: 154 mg/dL — ABNORMAL HIGH (ref 70–99)
Glucose-Capillary: 97 mg/dL (ref 70–99)

## 2020-07-30 LAB — CBC
HCT: 37.1 % — ABNORMAL LOW (ref 39.0–52.0)
Hemoglobin: 12.4 g/dL — ABNORMAL LOW (ref 13.0–17.0)
MCH: 30.4 pg (ref 26.0–34.0)
MCHC: 33.4 g/dL (ref 30.0–36.0)
MCV: 90.9 fL (ref 80.0–100.0)
Platelets: 353 10*3/uL (ref 150–400)
RBC: 4.08 MIL/uL — ABNORMAL LOW (ref 4.22–5.81)
RDW: 14.8 % (ref 11.5–15.5)
WBC: 11.6 10*3/uL — ABNORMAL HIGH (ref 4.0–10.5)
nRBC: 0 % (ref 0.0–0.2)

## 2020-07-30 LAB — BASIC METABOLIC PANEL
Anion gap: 10 (ref 5–15)
BUN: 26 mg/dL — ABNORMAL HIGH (ref 6–20)
CO2: 25 mmol/L (ref 22–32)
Calcium: 9 mg/dL (ref 8.9–10.3)
Chloride: 104 mmol/L (ref 98–111)
Creatinine, Ser: 0.92 mg/dL (ref 0.61–1.24)
GFR, Estimated: >60 mL/min (ref >60)
Glucose, Bld: 157 mg/dL — ABNORMAL HIGH (ref 70–99)
Potassium: 4 mmol/L (ref 3.5–5.1)
Sodium: 139 mmol/L (ref 135–145)

## 2020-07-30 LAB — VDRL, CSF: VDRL Quant, CSF: NONREACTIVE

## 2020-07-30 MED ORDER — VECURONIUM BROMIDE 10 MG IV SOLR: 10.0000 mg | Freq: Once | INTRAVENOUS | Status: DC

## 2020-07-30 MED ORDER — FENTANYL CITRATE (PF) 100 MCG/2ML IJ SOLN: 200.0000 ug | Freq: Once | INTRAMUSCULAR | Status: DC

## 2020-07-30 MED ORDER — PROPOFOL 10 MG/ML IV BOLUS: 500.0000 mg | Freq: Once | INTRAVENOUS | Status: DC

## 2020-07-30 MED ORDER — ETOMIDATE 2 MG/ML IV SOLN: 40.0000 mg | Freq: Once | INTRAVENOUS | Status: DC

## 2020-07-30 MED ORDER — MIDAZOLAM HCL 2 MG/2ML IJ SOLN: 5.0000 mg | Freq: Once | INTRAMUSCULAR | Status: DC

## 2020-07-30 MED ORDER — HEPARIN SODIUM (PORCINE) 5000 UNIT/ML IJ SOLN
5000.0000 [IU] | Freq: Three times a day (TID) | INTRAMUSCULAR | Status: DC
  Administered 2020-07-31 – 2020-08-01 (×4): 5000 [IU] via SUBCUTANEOUS
  Filled 2020-07-30 (×3): qty 1

## 2020-07-30 NOTE — Progress Notes (Signed)
SLP Cancellation Note  Patient Details Name: Benjamin Burke MRN: [id number] DOB: Feb 28, 1961   Cancelled treatment:       Reason Eval/Treat Not Completed: Medical issues which prohibited therapy. Pt remains on vent. Note that family is also considering trach/PEG. Will continue to follow.    Osie Bond., M.A. [facility] Pager [phone] Office [phone]  07/30/2020, 7:42 AM

## 2020-07-30 NOTE — Progress Notes (Addendum)
STROKE TEAM PROGRESS NOTE   INTERVAL HISTORY No family is at the bedside. Pt remains intubated for respiratory failure and heart failure., on sedation patient has been unable to be weaned and developed respiratory distress.  TEE was attempted yesterday but patient kept biting and so could not be advanced.  Plans are to reattempt today.  No neurological changes.  Remains heavily sedated for comfort as he gets agitated when sedation reduced. OBJECTIVE Vitals:   07/30/20 1118 07/30/20 1130 07/30/20 1200 07/30/20 1230  BP:  136/83 (!) 157/94 126/86  Pulse:  75 73 71  Resp:  18 20 20   Temp: 98.6 F (37 C)     TempSrc: Oral     SpO2:  98% 99% 98%  Weight:      Height:       CBC:  Recent Labs  Lab 07/03/2020 0937 07/18/2020 1012 07/29/20 0151 07/30/20 0101  WBC 15.6*   < > 10.4 11.6*  NEUTROABS 12.6*  --  6.4  --   HGB 17.0   < > 11.4* 12.4*  HCT 51.5   < > 34.3* 37.1*  MCV 92.8   < > 92.2 90.9  PLT 365   < > 349 353   < > = values in this interval not displayed.   Basic Metabolic Panel:  Recent Labs  Lab 07/26/20 0509 07/26/20 0626 07/26/20 1621 07/26/20 1940 07/27/20 0119 07/29/20 0151 07/30/20 0101  NA  --    < >  --   --    < > 142 139  K  --    < >  --   --    < > 3.9 4.0  CL  --    < >  --   --    < > 102 104  CO2  --    < >  --   --    < > 30 25  GLUCOSE  --    < >  --   --    < > 119* 157*  BUN  --    < >  --   --    < > 26* 26*  CREATININE  --    < >  --   --    < > 0.91 0.92  CALCIUM  --    < >  --   --    < > 8.9 9.0  MG 2.2  --  2.0  --   --   --   --   PHOS 1.6*  --  2.9 3.3  --   --   --    < > = values in this interval not displayed.    Lipid Panel:     Component Value Date/Time   CHOL 116 07/25/2020 0126   TRIG 75 07/28/2020 0642   HDL 36 (L) 07/25/2020 0126   CHOLHDL 3.2 07/25/2020 0126   VLDL 20 07/25/2020 0126   LDLCALC 60 07/25/2020 0126   HgbA1c:  Lab Results  Component Value Date   HGBA1C 6.1 (H) 07/25/2020   Urine Drug Screen:      Component Value Date/Time   LABOPIA NONE DETECTED 07/19/2020 1025   COCAINSCRNUR POSITIVE (A) 07/10/2020 1025   LABBENZ NONE DETECTED 07/06/2020 1025   AMPHETMU NONE DETECTED 07/28/2020 1025   THCU POSITIVE (A) 07/10/2020 1025   LABBARB NONE DETECTED 07/07/2020 1025    Alcohol Level     Component Value Date/Time   ETH <10 07/08/2020 0939  IMAGING CT HEAD WITHOUT CONTRAST 07/16/2020 TECHNIQUE: Contiguous axial images were obtained from the base of the skull through the vertex without intravenous contrast. COMPARISON:  None. FINDINGS: Brain: Patchy bilateral fairly symmetric white matter hypodensity in both hemispheres. Confluent asymmetric mixed hypo and hyperdensity in the superior left occipital pole, series 3, image 14 and coronal image 63. No similar cortical hypodensity elsewhere. Deep gray nuclei and posterior fossa gray-white differentiation remains within normal limits. No associated intracranial mass effect. No midline shift. No ventriculomegaly. No extra-axial hemorrhage identified. Vascular: Mild Calcified atherosclerosis at the skull base. Suggestion of fetal type left PCA origin. Questionable hyperdense left PCA P2 are P3 segment on series 3, image 15. No other suspicious intracranial vascular hyperdensity. Skull: Negative. Sinuses/Orbits: Right nasal airway in place. Small volume retained secretions in the nasopharynx. Mild mucosal thickening and bubbly opacity in the left ethmoid and maxillary sinuses. Tympanic cavities and mastoids are clear. Other: Visualized orbits and scalp soft tissues are within normal limits. IMPRESSION: 1. Abnormal mixed hypo- and hyperdensity in the superior left occipital lobe encompassing roughly 3 cm. [name] this is an acute or subacute Left PCA territory infarct with petechial hemorrhage (questionable hyperdense left PCA) - but recommend Head MRI (without contrast may suffice) to confirm. 2. No significant intracranial mass effect.  Superimposed age advanced bilateral white matter changes, most commonly due to chronic small vessel disease. 3. Right nasal airway in place. Mild left paranasal sinus inflammation.  MRI brain 07/26/2020 No acute infarction affects the brainstem or cerebellum. 3 cm acute infarction at the left parietooccipital junction on the left. Watershed pattern infarctions extending front to back affecting both hemispheres at the watershed regions. Cortical and white matter involvement as is typical. Mild chronic small-vessel ischemic change of the cerebral hemispheric white matter otherwise is suspected. No evidence of mass, frank hematoma, hydrocephalus or extra-axial collection. Minor petechial blood products present in the left parietooccipital junction region infarction. Vascular: Major vessels at the base of the brain show flow. Skull and upper cervical spine: Negative Sinuses/Orbits: Mucosal inflammatory changes of the paranasal sinuses. Orbits are negative.  MRA HEAD FINDINGS 07/29/2020 Both internal carotid arteries are widely patent through the skull base and siphon regions. The anterior and middle cerebral vessels are patent. 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 Fetal origin of the left PCA from the anterior circulation.  Dominant left vertebral artery widely patent to the basilar. Non dominant right vertebral artery shows atherosclerotic narrowing and irregularity with does give flow to the basilar. The basilar artery shows atherosclerotic irregularity but no stenosis. Superior cerebellar and posterior cerebral arteries show flow. As noted above, left PCA arises from the anterior circulation.  IMPRESSION: 1. Watershed pattern infarctions extending front to back affecting both hemispheres at the watershed regions. 3 cm acute infarction at the left parietooccipital junction was visible at CT mild petechial blood products in the left parietooccipital infarction region.  No evidence of frank hematoma or mass effect. Findings most likely secondary to global hypoperfusion event. 2. Background pattern of mild chronic small-vessel ischemic change of the cerebral hemispheric white matter elsewhere. 3. MR angiography does not show any large or medium vessel occlusion. There is atherosclerotic irregularity of the non dominant right vertebral artery and the basilar artery but no significant stenosis. 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 Fetal origin of the left PCA from the anterior circulation.   DG Chest Port 1 View  Result Date: 07/29/2020 CLINICAL DATA:  Respiratory failure EXAM: PORTABLE CHEST 1  VIEW COMPARISON:  July 25, 2020 FINDINGS: The cardiomediastinal silhouette is unchanged in contour.ETT tip terminates 4.4 cm above the carina. Feeding tube tip terminates over the stomach. No pleural effusion. No pneumothorax. Unchanged postsurgical changes of the RIGHT apex. Mild diffuse interstitial prominence with central vascular congestion, similar in comparison to prior. Low lung volumes. Visualized abdomen is unremarkable. No acute osseous abnormality. IMPRESSION: 1.  Support apparatus as described above. 2. Persistent diffuse interstitial prominence likely reflecting pulmonary edema. Electronically Signed   By: Valentino Saxon MD   On: 07/29/2020 07:55   Transthoracic Echocardiogram  1. Left ventricular ejection fraction, by estimation, is 30 to 35%. The  left ventricle has moderately decreased function. The left ventricle  demonstrates global hypokinesis. Left ventricular diastolic parameters are  consistent with Grade I diastolic  dysfunction (impaired relaxation).  2. Right ventricular systolic function is normal. The right ventricular  size is normal.  3. The mitral valve is normal in structure. No evidence of mitral valve  regurgitation. No evidence of mitral stenosis.  4. The aortic valve is normal in structure.  Aortic valve regurgitation is  not visualized. No aortic stenosis is present.  5. The inferior vena cava is normal in size with greater than 50%  respiratory variability, suggesting right atrial pressure of 3 mmHg.   Bilateral Carotid Dopplers  Right Carotid: Velocities in the right ICA are consistent with a 1-39%  stenosis. The ECA appears occluded.  Left Carotid: Velocities in the left ICA are consistent with a 1-39%  stenosis.  Vertebrals: Bilateral vertebral arteries demonstrate antegrade flow.   ECG - ST rate 112 BPM. (See cardiology reading for complete details)  PHYSICAL EXAM - Relatively unchanged compared to yesterday.  Temp:  [98.6 F (37 C)-100.2 F (37.9 C)] 98.6 F (37 C) (03/01 1118) Pulse Rate:  [60-111] 71 (03/01 1230) Resp:  [18-34] 20 (03/01 1230) BP: (94-188)/(59-136) 126/86 (03/01 1230) SpO2:  [94 %-100 %] 98 % (03/01 1230) FiO2 (%):  [40 %] 40 % (03/01 1114) Weight:  [83.2 kg] 83.2 kg (03/01 0458)  General - Well nourished, well developed middle-aged Caucasian male, appears older than listed age, intubated on sedation.  Ophthalmologic - fundi not visualized.  Cardiovascular - Regular rhythm and rate at time of exam.  Neuro - intubated on Precedex, eyes are now open with   tonic upgaze deviation in mid position, not blinking to visual threat, doll's eyes present  , not tracking, PERRL. Corneal reflex weakly present bilaterally, gag and cough present. Breathing over the vent. Facial symmetry not able to test due to ET tube. Tongue protrusion not cooperative. Moves bilateral lower extremities spontaneously and semi purposefully and trace bilateral upper extremities however strength is significantly diminished in the uppers. DTR diminished and bilateral babinski. Sensation, coordination and gait not tested.    ASSESSMENT/PLAN [name] is a 60 y.o. male with history of cocaine abuse, chronic back pain, hypertension, COPD and tobacco abuse, brought  into the emergency room after he was  found down by family.  He did not receive IV t-PA due to late presentation (>4.5 hours from time of onset). Patient was agitated with "upward gaze"overnight 2/25-26/2022 during transition to precedex from propofol and Neuro consulted overnight and EEG ordered which did not show seizure or epileptiform discharges. Degree of encephalopathy is out of proportion to what is seen on imaging however the patient is sedated. Bilateral Babinski may be due to bilateral strokes however encephalopathy may be a confounder. Would prefer not to  make too many concurrent medication changes which may confound neurologic exam.   Stroke: b/l MCA/ACA, MCA/PCA watershed and b/l MCA punctate infarcts, findings concerning global hypoperfusion event in the setting of  Suspected sepsis and AKI. 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 [name] this is an acute or subacute Left PCA territory infarct with petechial hemorrhage (questionable hyperdense left PCA)   MRI head - Watershed pattern infarctions extending front to back affecting both hemispheres at the watershed regions. 3 cm acute infarction at the left parietooccipital junction was visible at CT mild petechial blood products in the left parietooccipital infarction region.  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 "  MRA head - unremarkable, nondominant right VA with distal occlusion  Carotid Doppler - unremarkable  2D Echo - EF 30-35% but no vegetation  Recommend TEE to rule out LV thrombus or endocarditis  Lacey Jensen Virus 2 - negative  LDL - 60  HgbA1c - 6.1  UDS - Cocaine and THCU  VTE prophylaxis - heparin IV  aspirin 81 mg daily prior to admission, now on No antithrombotic given concerns of  endocarditis  Ongoing aggressive stroke risk factor management  Therapy recommendations:  pending  Disposition:  Pending  Sepsis ruled out Fever  Leukocytosis  Tmax 100.8->101.3  Leukocytosis - WBC's - 15.6->20.8->16.7->14.9  Blood cultures NGTD  UA neg  CXR Persistent bilateral interstitial prominence, pneumonitis cannot be excluded.  CSF WBC 2, RBC 7, protein 31, glucose 82 - no CNS infection  On Vancomycin + Cefepime -> Rocephin  troponinemia    Trop 1605->6484->6476  EF 30-35%, no vegetation  CCM on board  Cardiology on board concerning for demand ischemia  Recommend TEE to rule out LV thrombus or endocarditis  Respiratory failure  Intubated on vent  CCM on board  Not extubate candidate now given mental status  AKI  creatinine - 2.70->2.48->1.68->1.03  Normalized  BMP monitoring  On IVF  Hypertension  Home BP meds: none   Current BP meds: none   Stable . Avoid low BP . Long-term BP goal normotensive  Hyperlipidemia  Home Lipid lowering medication: Lipitor 20 mg daily  LDL 60, goal < 70  Hold off statin due to elevated LFTs  AST/ALT 74/55 -> 45/60 consider statin at discharge.  Cocaine abuse   Hx of cocaine use  UDS positive for cocaine  Cessation education will be provided  Tobacco abuse  Current smoker  Smoking cessation counseling will be provided  Other Stroke Risk Factors  Family hx stroke (mother)   Substance abuse - THC positive, cessation education will be provided  Other Active Problems, Findings, Recommendations and/or Plan  Code status - Full code   Elevate LFTs  Hospital day # 6 Patient remains heavily sedated for comfort and management of his respiratory failure.  TEE schedule hopefully  today to guide management.  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  He is unlikely to survive  without prolonged ventilatory support, tracheostomy, PEG tube and nursing home care...  Continue ongoing management as per critical care team.  Discussed with Dr. Lake Bells This patient is critically ill due to sepsis, septic shock, stroke, elevated troponin, AKI, and at significant risk of neurological worsening, death  form septic shock, endocarditis, recurrent stroke, heart failure, seizure. This patient's care requires constant monitoring of vital signs, hemodynamics, respiratory and cardiac monitoring, review of multiple databases, neurological assessment, discussion with family, other specialists and medical decision making of high complexity. I spent 30 minutes of neurocritical care time in the care of this patient. I have discussed with Dr. Kathalene Frames critical care medicine  Antony Contras, MD 07/30/2020 1:12 PM  To contact Stroke Continuity provider, please refer to [email or web address] After hours, contact General Neurology

## 2020-07-30 NOTE — Progress Notes (Signed)
Nutrition Follow-up  DOCUMENTATION CODES:   Not applicable  INTERVENTION:   Continue tube feeding via Cortrak: Vital AF 1.2 at 70 ml/h (1680 ml per day)  Provides 2016 kcal, 126 gm protein, 1362 ml free water daily  NUTRITION DIAGNOSIS:   Inadequate oral intake related to inability to eat as evidenced by NPO status.  Ongoing  GOAL:   Patient will meet greater than or equal to 90% of their needs   Met with TF  MONITOR:   Vent status,TF tolerance,Labs  REASON FOR ASSESSMENT:   Ventilator,Rounds    ASSESSMENT:   60 yo male admitted with AMS and fever. PMH includes cocaine abuse, COPD, HTN, tobacco abuse, poor dentition.  Discussed patient in ICU rounds and with RN today. Patient is now DNR. Palliative Care team has been consulted with plans for a family meeting today.  Tentative plans for tracheostomy tomorrow. Cortrak placed 2/25 with tip in the stomach. Currently receiving Vital AF 1.2 at 70 ml/h, tolerating without difficulty.  Patient remains intubated on ventilator support MV: 11.6 L/min Temp (24hrs), Avg:99.8 F (37.7 C), Min:98.6 F (37 C), Max:100.2 F (37.9 C)   Labs reviewed. CBG: [phone]  Medications reviewed and include colace, spironolactone, precedex, fentanyl.  Weight up to 83.2 kg today I/O + 5.5 L since admission UOP 1275 ml x 24 hours  Diet Order:   Diet Order            Diet NPO time specified  Diet effective midnight           Diet NPO time specified  Diet effective midnight                 EDUCATION NEEDS:   No education needs have been identified at this time  Skin:  Skin Assessment: Reviewed RN Assessment  Last BM:  3/1 rectal tube  Height:   Ht Readings from Last 1 Encounters:  07/25/20 5' 7.01" (1.702 m)    Weight:   Wt Readings from Last 1 Encounters:  07/30/20 83.2 kg    Ideal Body Weight:  67.3 kg  BMI:  Body mass index is 28.72 kg/m.  Estimated Nutritional Needs:   Kcal:   2015  Protein:  110-130 gm  Fluid:  >/=2 L    Lucas Mallow, RD, LDN, CNSC Please refer to Amion for contact information.

## 2020-07-30 NOTE — Progress Notes (Signed)
Placed pt back on full vent support due to increased Work of breathing and increased respiratory rate.

## 2020-07-30 NOTE — Progress Notes (Signed)
NAME:  Benjamin Burke, MRN:  [id number], DOB:  1961-05-21, LOS: 6 ADMISSION DATE:  07/20/2020, CONSULTATION DATE:  2/23 REFERRING MD:  EDP, CHIEF COMPLAINT:  Found down   Brief History:  60 y/o male admitted after found nearly unresponsive by family, had fever, had been using cocaine the night before.  Past Medical History:  Chronic back pain Cocaine abuse COPD Hypertension Poor dentition History of spontaneous pneumothorax Tobacco abuse  Significant Hospital Events:    Consults:  CCM Neurology  Procedures:  2/23 ETT>   Significant Diagnostic Tests:  CT head 2/23-abnormal mixed hypo and hyper densities in the superior left occipital lobe encompassing roughly 3 cm.  [name] an acute or subacute left PCA territory infarct with petechial hemorrhage but recommend had an MRI.  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 3 cm acute infarction at the left parietooccipital junction was visible at CT mild petechial blood products in the left parietooccipital infarction region. No evidence of frank hematoma or mass effect. Findings most likely secondary to global hypoperfusion event.  2/24 TTE LVEF 18-29%, RV systolic function normal, valves OK  Micro Data:  2/23 2023 blood culture x2>> no growth 1 day 07/24/2021 sputum culture 07/24/2021 urine culture  Antimicrobials:  07/20/2020 vancomycin x1 07/20/2020 cefepime x1 07/13/2020 Flagyl x1 2/24 ceftriaxone >   Interim History / Subjective:   Lengthy family meeting yesterday, they want a tracheostomy   Objective   Blood pressure (!) 168/76, pulse 81, temperature 100 F (37.8 C), temperature source Oral, resp. rate (!) 22, height 5' 7.01" (1.702 m), weight 83.2 kg, SpO2 100 %.    Vent Mode: PRVC FiO2 (%):  [40 %] 40 % Set Rate:  [22 bmp] 22 bmp Vt Set:  [530 mL] 530 mL PEEP:  [5 cmH20] 5 cmH20 Plateau Pressure:  [14 cmH20-32 cmH20] 16 cmH20    Intake/Output Summary (Last 24 hours) at 07/30/2020 0900 Last data filed at 07/30/2020 0700 Gross per 24 hour  Intake 1512.45 ml  Output 1125 ml  Net 387.45 ml   Filed Weights   07/28/20 0500 07/29/20 0441 07/30/20 0458  Weight: 83.3 kg 82.7 kg 83.2 kg    Examination: General:  In bed on vent HENT: NCAT ETT in place PULM: CTA B, vent supported breathing CV: RRR, no mgr GI: BS+, soft, nontender MSK: normal bulk and tone Neuro: opens eyes to voice, persistent upward gaze, doesn't follow commands, doesn't move arms, will move legs spontaneously     Resolved Hospital Problem list     Assessment & Plan:  Acute metabolic encephalopathy > has some degree of consciousness, but is not making purposeful movements, chances of return to independent living is very low 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 > presumably related to hypoperfusion in setting of cardiac arrhythmia RASS target 0 Fentanyl/precedex for ventilator synchrony Family desires tracheostomy and PEG  Aspiration pneumonia Monitor off antibiotics  Systolic heart failure> presumably cocaine related Cozaar, coreg to continue Tele Ischemia work up if wakes up  Hypokalemia Replaced 2/28  Cocaine abuse Counsel to quit  Acute respiratory failure with hypoxemia COPD, not in exacerbation Full mechanical vent support VAP prevention Daily WUA/SBT Plan tracheostomy per family request on 3/2  Prognosis: likelihood of return to independent living is very low, though with some level of conciousness his ultimate prognosis is uncertain.  Have explained to the family at length.  There is not consensus among them as his son, brother and sister feel  that [name] would not want a tracheostomy and they feel we should extubate him.  However another sister and Tim's niece are driving the conversation and care plan towards tracheostomy.  Have asked palliative care to consult with the family regarding this.  Tentatively planning on performing  a tracheostomy on 3/2 at 10:00   Best practice (evaluated daily)  Diet: full Pain/Anxiety/Delirium protocol (if indicated): as above VAP protocol (if indicated): yes DVT prophylaxis: sub q heparin GI prophylaxis: famotidine Glucose control: monitor Mobility: bed rest Disposition:remain in ICU  Goals of Care:  Last date of multidisciplinary goals of care discussion: 2/28 Family and staff present: see plan of care note Summary of discussion: full scope of practice Follow up goals of care discussion due: 3/2  Code Status: full  Labs   CBC: Recent Labs  Lab 07/23/2020 0937 07/07/2020 1012 07/26/20 0626 07/27/20 0119 07/28/20 0642 07/29/20 0151 07/30/20 0101  WBC 15.6*   < > 14.9* 11.7* 8.8 10.4 11.6*  NEUTROABS 12.6*  --   --   --   --  6.4  --   HGB 17.0   < > 12.1* 12.0* 12.0* 11.4* 12.4*  HCT 51.5   < > 36.8* 36.4* 36.9* 34.3* 37.1*  MCV 92.8   < > 93.2 91.5 93.2 92.2 90.9  PLT 365   < > 263 296 291 349 353   < > = values in this interval not displayed.    Basic Metabolic Panel: Recent Labs  Lab 07/04/2020 1247 07/25/20 0126 07/25/20 0610 07/25/20 1629 07/26/20 0509 07/26/20 0626 07/26/20 1621 07/26/20 1940 07/27/20 0119 07/28/20 0642 07/29/20 0151 07/30/20 0101  NA 140 141   < >  --   --  139  --   --  138 141 142 139  K 5.1 4.5   < >  --   --  3.7  --   --  4.0 3.3* 3.9 4.0  CL 106 108  --   --   --  104  --   --  106 104 102 104  CO2 21* 24  --   --   --  26  --   --  24 28 30 25   GLUCOSE 148* 128*  --   --   --  117*  --   --  128* 141* 119* 157*  BUN 23* 24*  --   --   --  20  --   --  17 18 26* 26*  CREATININE 2.48* 1.68*  --   --   --  1.03  --   --  0.87 0.77 0.91 0.92  CALCIUM 7.8* 8.3*  --   --   --  8.2*  --   --  8.3* 8.6* 8.9 9.0  MG 1.7 2.7*  --  2.1 2.2  --  2.0  --   --   --   --   --   PHOS 5.0* 2.2*  --  1.6* 1.6*  --  2.9 3.3  --   --   --   --    < > = values in this interval not displayed.   GFR: Estimated Creatinine Clearance: 91.3  mL/min (by C-G formula based on SCr of 0.92 mg/dL). Recent Labs  Lab 07/25/2020 2119 07/19/2020 1247 07/19/2020 1515 07/25/20 0126 07/27/20 0119 07/28/20 0642 07/29/20 0151 07/30/20 0101  PROCALCITON  --  7.20  --   --   --   --   --   --  WBC  --  20.8*  --    < > 11.7* 8.8 10.4 11.6*  LATICACIDVEN 3.0* 3.3* 3.2*  --   --   --   --   --    < > = values in this interval not displayed.    Liver Function Tests: Recent Labs  Lab 07/11/2020 0937 07/03/2020 1247 07/27/20 0119 07/29/20 0151  AST 50* 74* 45* 51*  ALT 44 55* 60* 74*  ALKPHOS 88 77 90 124  BILITOT 0.6 0.9 0.6 0.8  PROT 7.5 6.6 5.9* 5.8*  ALBUMIN 4.0 3.4* 2.6* 2.2*   Recent Labs  Lab 07/29/2020 1247  LIPASE 32  AMYLASE 614*   Recent Labs  Lab 07/09/2020 0937  AMMONIA 43*    ABG    Component Value Date/Time   PHART 7.346 (L) 07/25/2020 0610   PCO2ART 42.1 07/25/2020 0610   PO2ART 76 (L) 07/25/2020 0610   HCO3 23.0 07/25/2020 0610   TCO2 24 07/25/2020 0610   ACIDBASEDEF 3.0 (H) 07/25/2020 0610   O2SAT 94.0 07/25/2020 0610     Coagulation Profile: Recent Labs  Lab 07/29/2020 0937 07/29/2020 1247  INR 1.1 1.1    Cardiac Enzymes: Recent Labs  Lab 07/09/2020 0937  CKTOTAL 294    HbA1C: Hgb A1c MFr Bld  Date/Time Value Ref Range Status  07/25/2020 01:26 AM 6.1 (H) 4.8 - 5.6 % Final    Comment:    (NOTE) Pre diabetes:          5.7%-6.4%  Diabetes:              >6.4%  Glycemic control for   <7.0% adults with diabetes     CBG: Recent Labs  Lab 07/29/20 1545 07/29/20 1929 07/29/20 2324 07/30/20 0321 07/30/20 0743  GLUCAP 98 112* 107* 109* 154*     Critical care time: 35 minutes    Roselie Awkward, MD [facility] Pager: [phone] Cell: [phone] If no response, please call [phone] until 7pm After 7:00 pm call Elink  [phone]

## 2020-07-30 NOTE — Consult Note (Signed)
Consultation Note Date: 07/30/2020   Patient Name: Benjamin Burke  DOB: 08-25-1960  MRN: [id number]  Age / Sex: 60 y.o., male  PCP: Benjamin Burke, Benjamin Burke Referring Physician: Juanito Doom, MD  Reason for Consultation: Establishing goals of care  HPI/Patient Profile: 60 y.o. male  with past medical history of COPD, cocaine abuse, HTN, chronic back pain,   admitted on 07/06/2020 after being found down in his home, unresponsive, having vomited. Workup reveals sepsis, aspiration pneumonia, watershed infarcts, anoxic brain injury, systolic heart failure with EF 30-35%. He has vent dependent respiratory failure. If sedation is lightened he does not tolerate becoming agitated with increased work of breathing, increased heart rate, not following commands and therefore has not been able to wean. Palliative medicine consulted for goals of care discussion and decision regarding proceeding with trach/peg.   Clinical Assessment and Goals of Care:  I have reviewed medical records including EPIC notes, labs and imaging, received report from Dr. Lake Burke, and patient's nurse Benjamin Burke, examined the patient and met at bedside with patient's sisters-Benjamin Burke and Benjamin Burke; and his nieces-and Benjamin Burke to discuss diagnosis prognosis, GOC, EOL wishes, disposition and options.  We discussed a brief life review of the patient.  Prior to this admission he was living at home with his niece, Benjamin Burke.  He is not married.  He has 1 son who is 31 and has some cognitive delays.  He is well-known in [place] for his kindess.  He has a caring heart enjoys caring for others.  As far as functional and nutritional status prior to this admission he was living independently.  We discussed his current illness and what it means in the larger context of his on-going co-morbidities.  Natural disease trajectory and expectations at EOL were discussed.     Recently, patient shared with Benjamin Burke his desire for her to care for his son should anything happen to him.  He also had recent discussions with other family members about arrangements for his son, and his preferences for after he dies. Family feels that patient may have known he was coming close to end-of-life.  I attempted to elicit values and goals of care important to the patient.     The difference between aggressive medical intervention and comfort care was discussed.   Advanced directives, concepts specific to code status, artifical feeding and hydration, and rehospitalization were considered and discussed.  Patient is currently DNR after family discussion with attending team, therefore we did not discuss this again. He would not want for prolonged living in a nursing facility.  Questions and concerns were addressed.  The family was encouraged to call with questions or concerns.   Hard choices book was given to family review.  Primary Decision Maker NEXT OF KIN- patient is not married and does not have living parents. He has a son, however, his son is 48 and does not have ability to make decisions for patient. Therefore, legal next of kin is a consensus of patient's adult siblings. His siblings are also including his nieces  as he lives with Benjamin Burke and they are all very close.     SUMMARY OF RECOMMENDATIONS -Continue current scope of care -Family requests that trach be postponed until they have time to further discuss proceeding with trach/PEG and aggressive care- vs transition to comfort measures and supporting through dying process -Plan made for PMT to touch base with patient's sister, Benjamin Burke, tomorrow for followup -Notified Dr. Lake Burke via secure chat of plan    Code Status/Advance Care Planning:  DNR  Prognosis:    Unable to determine- even with continued aggressive measures his time of life is likely limited in quality and quantity. If he is extubated and transitioned to  comfort measures only- he would likely survive only days-weeks, possibly hours.   Discharge Planning: To Be Determined  Primary Diagnoses: Present on Admission: . AMS (altered mental status)   I have reviewed the medical record, interviewed the patient and family, and examined the patient. The following aspects are pertinent.  Past Medical History:  Diagnosis Date  . Chronic back pain   . Cocaine abuse (Benjamin Burke) 08/09/2016   Patient caught using cocaine in his hospital room while an inpatient for spontaneous pneumothorax  . COPD (chronic obstructive pulmonary disease) ([place])   . Hypertension   . Poor dentition 09/27/2012  . Right spontaneous pneumothorax 08/07/2016  . Tobacco abuse 03/24/2012   Overview:  1 PPD x age 36.  Prior h/o smokeless tobacco but quit    Scheduled Meds: . atorvastatin  80 mg Per Tube Daily  . carvedilol  12.5 mg Oral BID WC  . chlorhexidine gluconate (MEDLINE KIT)  15 mL Mouth Rinse BID  . Chlorhexidine Gluconate Cloth  6 each Topical Daily  . docusate  100 mg Per Tube BID  . etomidate  40 mg Intravenous Once  . famotidine  20 mg Per Tube BID  . fentaNYL (SUBLIMAZE) injection  200 mcg Intravenous Once  . heparin injection (subcutaneous)  5,000 Units Subcutaneous Q8H  . [START ON 07/31/2020] heparin injection (subcutaneous)  5,000 Units Subcutaneous Q8H  . losartan  25 mg Per Tube Daily  . mouth rinse  15 mL Mouth Rinse 10 times per day  . midazolam  5 mg Intravenous Once  . polyethylene glycol  17 g Per Tube Daily  . propofol  500 mg Intravenous Once  . spironolactone  25 mg Oral Daily  . vecuronium  10 mg Intravenous Once   Continuous Infusions: . dexmedetomidine (PRECEDEX) IV infusion 0.7 mcg/kg/hr (07/30/20 1446)  . feeding supplement (VITAL AF 1.2 CAL) 1,000 mL (07/30/20 1334)  . fentaNYL infusion INTRAVENOUS 150 mcg/hr (07/30/20 1400)   PRN Meds:.acetaminophen (TYLENOL) oral liquid 160 mg/5 mL, fentaNYL (SUBLIMAZE) injection Medications Prior to  Admission:  Prior to Admission medications   Medication Sig Start Date End Date Taking? Authorizing Provider  albuterol (VENTOLIN HFA) 108 (90 Base) MCG/ACT inhaler Inhale 2 puffs into the lungs every 6 (six) hours as needed for wheezing or shortness of breath.   Yes [provider]  aspirin EC 81 MG tablet Take 162 mg by mouth daily as needed (pain).   Yes [provider]  atorvastatin (LIPITOR) 20 MG tablet Take 1 tablet by mouth daily. 12/31/19  Yes [provider]  ipratropium-albuterol (DUONEB) 0.5-2.5 (3) MG/3ML SOLN Take 3 mLs by nebulization every 6 (six) hours as needed (wheezing, shortness of breath). 03/04/18  Yes Bonnielee Haff, MD  oxyCODONE (OXY IR/ROXICODONE) 5 MG immediate release tablet Take 1 tablet (5 mg total) by mouth every 6 (  six) hours as needed for severe pain. Take 5 mg by mouth every 4-6 hours PRN severe pain 03/29/18  Yes Melrose Nakayama, MD   Allergies  Allergen Reactions  . Tylenol [Acetaminophen] Nausea And Vomiting   Review of Systems  Unable to perform ROS: Mental status change    Physical Exam Vitals and nursing note reviewed.  Constitutional:      Appearance: He is ill-appearing.  Neurological:     Comments: nonpurposeful movements of lower extremities, eyes are open but no response to visual threat, no tracking     Vital Signs: BP 126/74   Pulse 69   Temp 98.6 F (37 C) (Oral)   Resp 20   Ht 5' 7.01" (1.702 m)   Wt 83.2 kg   SpO2 97%   BMI 28.72 kg/m  Pain Scale: CPOT   Pain Score: Asleep   SpO2: SpO2: 97 % O2 Device:SpO2: 97 % O2 Flow Rate: .   IO: Intake/output summary:   Intake/Output Summary (Last 24 hours) at 07/30/2020 1726 Last data filed at 07/30/2020 1400 Gross per 24 hour  Intake 2007.97 ml  Output 625 ml  Net 1382.97 ml    LBM: Last BM Date: 07/30/20 Baseline Weight: Weight: 77.7 kg Most recent weight: Weight: 83.2 kg     Palliative Assessment/Data: PPS: 10%     Thank you for this  consult. Palliative medicine will continue to follow and assist as needed.   Time In: 1601 Time Out: 1753 Time Total: 111 minutes Greater than 50%  of this time was spent counseling and coordinating care related to the above assessment and plan.  Signed by: Mariana Kaufman, AGNP-C Palliative Medicine    Please contact Palliative Medicine Team phone at [phone] for questions and concerns.  For individual provider: See Shea Evans

## 2020-07-30 DEATH — deceased

## 2020-07-31 DIAGNOSIS — I5021 Acute systolic (congestive) heart failure: Secondary | ICD-10-CM | POA: Diagnosis not present

## 2020-07-31 DIAGNOSIS — R778 Other specified abnormalities of plasma proteins: Secondary | ICD-10-CM | POA: Diagnosis not present

## 2020-07-31 DIAGNOSIS — Z7189 Other specified counseling: Secondary | ICD-10-CM | POA: Diagnosis not present

## 2020-07-31 DIAGNOSIS — I6300 Cerebral infarction due to thrombosis of unspecified precerebral artery: Secondary | ICD-10-CM | POA: Diagnosis not present

## 2020-07-31 DIAGNOSIS — J9601 Acute respiratory failure with hypoxia: Secondary | ICD-10-CM | POA: Diagnosis not present

## 2020-07-31 DIAGNOSIS — N179 Acute kidney failure, unspecified: Secondary | ICD-10-CM | POA: Diagnosis not present

## 2020-07-31 LAB — GLUCOSE, CAPILLARY
Glucose-Capillary: 100 mg/dL — ABNORMAL HIGH (ref 70–99)
Glucose-Capillary: 116 mg/dL — ABNORMAL HIGH (ref 70–99)
Glucose-Capillary: 130 mg/dL — ABNORMAL HIGH (ref 70–99)
Glucose-Capillary: 136 mg/dL — ABNORMAL HIGH (ref 70–99)
Glucose-Capillary: 151 mg/dL — ABNORMAL HIGH (ref 70–99)
Glucose-Capillary: 152 mg/dL — ABNORMAL HIGH (ref 70–99)

## 2020-07-31 LAB — CBC
HCT: 35.8 % — ABNORMAL LOW (ref 39.0–52.0)
Hemoglobin: 11.8 g/dL — ABNORMAL LOW (ref 13.0–17.0)
MCH: 30.1 pg (ref 26.0–34.0)
MCHC: 33 g/dL (ref 30.0–36.0)
MCV: 91.3 fL (ref 80.0–100.0)
Platelets: 355 10*3/uL (ref 150–400)
RBC: 3.92 MIL/uL — ABNORMAL LOW (ref 4.22–5.81)
RDW: 14.6 % (ref 11.5–15.5)
WBC: 11.8 10*3/uL — ABNORMAL HIGH (ref 4.0–10.5)
nRBC: 0 % (ref 0.0–0.2)

## 2020-07-31 LAB — BASIC METABOLIC PANEL
Anion gap: 9 (ref 5–15)
BUN: 27 mg/dL — ABNORMAL HIGH (ref 6–20)
CO2: 27 mmol/L (ref 22–32)
Calcium: 9 mg/dL (ref 8.9–10.3)
Chloride: 102 mmol/L (ref 98–111)
Creatinine, Ser: 0.86 mg/dL (ref 0.61–1.24)
GFR, Estimated: >60 mL/min (ref >60)
Glucose, Bld: 116 mg/dL — ABNORMAL HIGH (ref 70–99)
Potassium: 4.5 mmol/L (ref 3.5–5.1)
Sodium: 138 mmol/L (ref 135–145)

## 2020-07-31 LAB — TRIGLYCERIDES: Triglycerides: 71 mg/dL (ref <150)

## 2020-07-31 NOTE — Progress Notes (Signed)
Progress Note  Patient Name: Benjamin Burke Date of Encounter: 07/31/2020  [facility] Cardiologist: New (Dr. Acie Fredrickson)  Subjective   Intubated and sedated.  Inpatient Medications    Scheduled Meds: . atorvastatin  80 mg Per Tube Daily  . carvedilol  12.5 mg Oral BID WC  . chlorhexidine gluconate (MEDLINE KIT)  15 mL Mouth Rinse BID  . Chlorhexidine Gluconate Cloth  6 each Topical Daily  . docusate  100 mg Per Tube BID  . etomidate  40 mg Intravenous Once  . famotidine  20 mg Per Tube BID  . fentaNYL (SUBLIMAZE) injection  200 mcg Intravenous Once  . heparin injection (subcutaneous)  5,000 Units Subcutaneous Q8H  . heparin injection (subcutaneous)  5,000 Units Subcutaneous Q8H  . losartan  25 mg Per Tube Daily  . mouth rinse  15 mL Mouth Rinse 10 times per day  . midazolam  5 mg Intravenous Once  . polyethylene glycol  17 g Per Tube Daily  . propofol  500 mg Intravenous Once  . spironolactone  25 mg Oral Daily  . vecuronium  10 mg Intravenous Once   Continuous Infusions: . dexmedetomidine (PRECEDEX) IV infusion 1.2 mcg/kg/hr (07/31/20 0625)  . feeding supplement (VITAL AF 1.2 CAL) 1,000 mL (07/31/20 0629)  . fentaNYL infusion INTRAVENOUS 200 mcg/hr (07/31/20 0600)   PRN Meds: acetaminophen (TYLENOL) oral liquid 160 mg/5 mL, fentaNYL (SUBLIMAZE) injection   Vital Signs    Vitals:   07/31/20 0630 07/31/20 0728 07/31/20 0803 07/31/20 0810  BP: 133/77 112/66    Pulse: 64 62    Resp: (!) 21     Temp:   98.2 F (36.8 C)   TempSrc:      SpO2: 100%   98%  Weight:      Height:        Intake/Output Summary (Last 24 hours) at 07/31/2020 0915 Last data filed at 07/31/2020 0600 Gross per 24 hour  Intake 2316.76 ml  Output 425 ml  Net 1891.76 ml   Last 3 Weights 07/31/2020 07/30/2020 07/29/2020  Weight (lbs) 179 lb 14.3 oz 183 lb 6.8 oz 182 lb 5.1 oz  Weight (kg) 81.6 kg 83.2 kg 82.7 kg      Telemetry    Normal sinus rhythm with rates in the 60's to 80's. -  Personally Reviewed  ECG    No new ECG tracing today.- Personally Reviewed  Physical Exam   GEN: Intubated and sedated. Neck: Unable to assess JVD due to patient's position. Cardiac: RRR. No murmurs, rubs, or gallops.  Respiratory: Intubated. Clear to auscultation bilaterally. GI: Soft and non-tender. MS: No significant lower extremity edema. No deformity. Neuro:  Intubated and sedated. Psych: Intubated and sedated.  Labs    High Sensitivity Troponin:   Recent Labs  Lab 07/08/2020 0937 07/23/2020 1247 07/06/2020 1515  TROPONINIHS 1,605* 6,484* 6,476*      Chemistry Recent Labs  Lab 07/28/2020 1247 07/25/20 0126 07/27/20 0119 07/28/20 0642 07/29/20 0151 07/30/20 0101 07/31/20 0422  NA 140   < > 138   < > 142 139 138  K 5.1   < > 4.0   < > 3.9 4.0 4.5  CL 106   < > 106   < > 102 104 102  CO2 21*   < > 24   < > _0 GLUCOSE 148*   < > 128*   < > 119* 157* 116*  BUN 23*   < > 17   < >  26* 26* 27*  CREATININE 2.48*   < > 0.87   < > 0.91 0.92 0.86  CALCIUM 7.8*   < > 8.3*   < > 8.9 9.0 9.0  PROT 6.6  --  5.9*  --  5.8*  --   --   ALBUMIN 3.4*  --  2.6*  --  2.2*  --   --   AST 74*  --  45*  --  51*  --   --   ALT 55*  --  60*  --  74*  --   --   ALKPHOS 77  --  90  --  124  --   --   BILITOT 0.9  --  0.6  --  0.8  --   --   GFRNONAA 30*   < > >60   < > >60 >60 >60  ANIONGAP 13   < > 8   < > _0 < > = values in this interval not displayed.     Hematology Recent Labs  Lab 07/29/20 0151 07/30/20 0101 07/31/20 0422  WBC 10.4 11.6* 11.8*  RBC 3.72* 4.08* 3.92*  HGB 11.4* 12.4* 11.8*  HCT 34.3* 37.1* 35.8*  MCV 92.2 90.9 91.3  MCH 30.6 30.4 30.1  MCHC 33.2 33.4 33.0  RDW 15.1 14.8 14.6  PLT 349 353 355    BNPNo results for input(s): BNP, PROBNP in the last 168 hours.   DDimer No results for input(s): DDIMER in the last 168 hours.   Radiology    No results found.  Cardiac Studies   Echocardiogram 07/25/2020: Impressions: 1. Left ventricular  ejection fraction, by estimation, is 30 to 35%. The  left ventricle has moderately decreased function. The left ventricle  demonstrates global hypokinesis. Left ventricular diastolic parameters are  consistent with Grade I diastolic  dysfunction (impaired relaxation).  2. Right ventricular systolic function is normal. The right ventricular  size is normal.  3. The mitral valve is normal in structure. No evidence of mitral valve  regurgitation. No evidence of mitral stenosis.  4. The aortic valve is normal in structure. Aortic valve regurgitation is  not visualized. No aortic stenosis is present.  5. The inferior vena cava is normal in size with greater than 50%  respiratory variability, suggesting right atrial pressure of 3 mmHg.   Patient Profile     60 y.o. male with a history of COPD, hypertension, tobacco abuse, and cocaine abuse who was admitted on 07/04/2020 after being found unresponsive. He was brought to the hospital for sepsis and intubated for airway protection. He was found to have bilateral watershed cerebrovascular infarcts. Cardiology was consulted for new onset cardiomyopathy and elevated troponin.  Assessment & Plan    Acute Stroke - Found to have bilateral watershed infarcts.  - TEE was attempted on 07/29/2020; however, probe was unable to be advanced into mouth as patient was clenching his teeth. Dr. Harrington Challenger discussed with Dr. Lake Bells - could paralyze patient transiently but given borderline hemodynamics and overall clinical state decision was made to postpone this. It sounds like patient will likely move to comfort care.   New Onset Cardiomyopathy - Admitted with possible drug overdose. Urine drug screen positive for cocaine and THC. Cardiomyopathy felt to be due to drug abuse.  - Echo showed LVEF of 30-35% with global hypokinesis and grade 1 diastolic dysfunction. - Does not appear volume overloaded. - Continue Losartan 10m daily, Coreg 12.539mtwice daily, and  Spironolactone 2551m  daily.  - Continue to monitor daily weights, strict I/O's, and renal function.  Elevated Troponin Likely Demand Ischemia - High-sensitivity troponin peaked at 6,484. - Echo as above. LV function felt to be out of proportion to troponin elevation.  - Possible coronary vasospasm triggering the event.  - No ischemic evaluation planned given patient's overall condition. - Start Aspirin when able per Neurology if decision is made to continue with full scope of care. - Continue Lipitor 38m daily.  Disposition: Palliative Care has met with family. Family is discussing whether to proceed with trach or move to comfort care. It sounds like family will likely choose comfort care with one way extubation has they do not feel like patient would want a trach. Cardiology will likely sign off.  For questions or updates, please contact CBurtonPlease consult [email or web address] for contact info under        Signed, CDarreld Mclean PA-C  07/31/2020, 9:15 AM

## 2020-07-31 NOTE — Progress Notes (Signed)
NAME:  Benjamin Burke, MRN:  [id number], DOB:  Nov 01, 1960, LOS: 7 ADMISSION DATE:  07/18/2020, CONSULTATION DATE:  2/23 REFERRING MD:  EDP, CHIEF COMPLAINT:  Found down   Brief History:  60 y/o male admitted after found nearly unresponsive by family, had fever, had been using cocaine the night before.  Past Medical History:  Chronic back pain Cocaine abuse COPD Hypertension Poor dentition History of spontaneous pneumothorax Tobacco abuse  Significant Hospital Events:    Consults:  CCM Neurology palliative  Procedures:  2/23 ETT>   Significant Diagnostic Tests:  CT head 2/23-abnormal mixed hypo and hyper densities in the superior left occipital lobe encompassing roughly 3 cm.  Benjamin Burke an acute or subacute left PCA territory infarct with petechial hemorrhage but recommend had an MRI.  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 3 cm acute infarction at the left parietooccipital junction was visible at CT mild petechial blood products in the left parietooccipital infarction region. No evidence of frank hematoma or mass effect. Findings most likely secondary to global hypoperfusion event.  2/24 TTE LVEF 51-76%, RV systolic function normal, valves OK  Micro Data:  2/23 2023 blood culture x2>> no growth 1 day 07/24/2021 sputum culture 07/24/2021 urine culture  Antimicrobials:  07/09/2020 vancomycin x1 07/15/2020 cefepime x1 07/12/2020 Flagyl x1 2/24 ceftriaxone >   Interim History / Subjective:   Family meeting with pallaitive yesterday: hold off on tracheostomy   Objective   Blood pressure 112/66, pulse 62, temperature 98.2 F (36.8 C), resp. rate (!) 21, height 5' 7.01" (1.702 m), weight 81.6 kg, SpO2 98 %.    Vent Mode: PSV;CPAP FiO2 (%):  [40 %] 40 % Set Rate:  [22 bmp] 22 bmp Vt Set:  [530 mL] 530 mL PEEP:  [5 cmH20] 5 cmH20 Pressure Support:  [5 cmH20] 5 cmH20 Plateau Pressure:   [14 cmH20-16 cmH20] 15 cmH20   Intake/Output Summary (Last 24 hours) at 07/31/2020 0844 Last data filed at 07/31/2020 0600 Gross per 24 hour  Intake 2416.71 ml  Output 425 ml  Net 1991.71 ml   Filed Weights   07/29/20 0441 07/30/20 0458 07/31/20 0500  Weight: 82.7 kg 83.2 kg 81.6 kg    Examination: General:  In bed on vent HENT: NCAT ETT in place PULM: CTA B, vent supported breathing CV: RRR, no mgr GI: BS+, soft, nontender MSK: normal bulk and tone Neuro: sedated on vent       Resolved Hospital Problem list     Assessment & Plan:  Acute metabolic encephalopathy > has some degree of consciousness, but is not making purposeful movements, chances of return to independent living is very low 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  Aspiration pneumonia Monitor off antibiotics  Systolic heart failure> presumably cocaine related Cozaar, coreg to continue tele  Cocaine abuse   Acute respiratory failure with hypoxemia COPD, not in exacerbation Full mechanical vent support VAP prevention Daily WUA/SBT Hold off on tracheostomy  Prognosis: likelihood of return to independent living is very low, though with some level of conciousness his ultimate prognosis is uncertain.  Have explained to the family at length.  There is not consensus among them as his son, brother and sister feel that Benjamin Burke would not want a tracheostomy and they feel we should extubate him.  After  family conversation with palliative medicine on 3/1, they are considering one way extubation.  Best practice (evaluated daily)  Diet: full Pain/Anxiety/Delirium protocol (if indicated): as above VAP protocol (if indicated): yes DVT prophylaxis: sub q heparin GI prophylaxis: famotidine Glucose control: monitor Mobility: bed  rest Disposition:remain in ICU  Goals of Care:  Last date of multidisciplinary goals of care discussion: 3/1 Family and staff present: see palliative care note Summary of discussion: DNR, see above/palliative care note Follow up goals of care discussion due: 3/2  Code Status:DNR  Labs   CBC: Recent Labs  Lab 07/03/2020 0937 07/07/2020 1012 07/27/20 0119 07/28/20 0642 07/29/20 0151 07/30/20 0101 07/31/20 0422  WBC 15.6*   < > 11.7* 8.8 10.4 11.6* 11.8*  NEUTROABS 12.6*  --   --   --  6.4  --   --   HGB 17.0   < > 12.0* 12.0* 11.4* 12.4* 11.8*  HCT 51.5   < > 36.4* 36.9* 34.3* 37.1* 35.8*  MCV 92.8   < > 91.5 93.2 92.2 90.9 91.3  PLT 365   < > 296 291 349 353 355   < > = values in this interval not displayed.    Basic Metabolic Panel: Recent Labs  Lab 07/17/2020 1247 07/25/20 0126 07/25/20 0610 07/25/20 1629 07/26/20 0509 07/26/20 0626 07/26/20 1621 07/26/20 1940 07/27/20 0119 07/28/20 0642 07/29/20 0151 07/30/20 0101 07/31/20 0422  NA 140 141   < >  --   --    < >  --   --  138 141 142 139 138  K 5.1 4.5   < >  --   --    < >  --   --  4.0 3.3* 3.9 4.0 4.5  CL 106 108  --   --   --    < >  --   --  106 104 102 104 102  CO2 21* 24  --   --   --    < >  --   --  24 28 30 25 27   GLUCOSE 148* 128*  --   --   --    < >  --   --  128* 141* 119* 157* 116*  BUN 23* 24*  --   --   --    < >  --   --  17 18 26* 26* 27*  CREATININE 2.48* 1.68*  --   --   --    < >  --   --  0.87 0.77 0.91 0.92 0.86  CALCIUM 7.8* 8.3*  --   --   --    < >  --   --  8.3* 8.6* 8.9 9.0 9.0  MG 1.7 2.7*  --  2.1 2.2  --  2.0  --   --   --   --   --   --   PHOS 5.0* 2.2*  --  1.6* 1.6*  --  2.9 3.3  --   --   --   --   --    < > = values in this interval not displayed.   GFR: Estimated Creatinine Clearance: 96.9 mL/min (by C-G formula based on SCr of 0.86 mg/dL). Recent Labs  Lab 07/10/2020 0938 07/20/2020 1247 07/25/2020 1515 07/25/20 0126 07/28/20 0642 07/29/20 0151 07/30/20 0101  07/31/20 0422  PROCALCITON  --  7.20  --   --   --   --   --   --  WBC  --  20.8*  --    < > 8.8 10.4 11.6* 11.8*  LATICACIDVEN 3.0* 3.3* 3.2*  --   --   --   --   --    < > = values in this interval not displayed.    Liver Function Tests: Recent Labs  Lab 07/28/2020 0937 07/15/2020 1247 07/27/20 0119 07/29/20 0151  AST 50* 74* 45* 51*  ALT 44 55* 60* 74*  ALKPHOS 88 77 90 124  BILITOT 0.6 0.9 0.6 0.8  PROT 7.5 6.6 5.9* 5.8*  ALBUMIN 4.0 3.4* 2.6* 2.2*   Recent Labs  Lab 07/06/2020 1247  LIPASE 32  AMYLASE 614*   Recent Labs  Lab 07/21/2020 0937  AMMONIA 43*    ABG    Component Value Date/Time   PHART 7.346 (L) 07/25/2020 0610   PCO2ART 42.1 07/25/2020 0610   PO2ART 76 (L) 07/25/2020 0610   HCO3 23.0 07/25/2020 0610   TCO2 24 07/25/2020 0610   ACIDBASEDEF 3.0 (H) 07/25/2020 0610   O2SAT 94.0 07/25/2020 0610     Coagulation Profile: Recent Labs  Lab 07/12/2020 0937 07/02/2020 1247  INR 1.1 1.1    Cardiac Enzymes: Recent Labs  Lab 07/05/2020 0937  CKTOTAL 294    HbA1C: Hgb A1c MFr Bld  Date/Time Value Ref Range Status  07/25/2020 01:26 AM 6.1 (H) 4.8 - 5.6 % Final    Comment:    (NOTE) Pre diabetes:          5.7%-6.4%  Diabetes:              >6.4%  Glycemic control for   <7.0% adults with diabetes     CBG: Recent Labs  Lab 07/30/20 1553 07/30/20 1950 07/30/20 2335 07/31/20 0332 07/31/20 0802  GLUCAP 110* 136* 97 100* 151*     Critical care time: 31 minutes    Roselie Awkward, MD [facility] Pager: [phone] Cell: [phone] If no response, please call [phone] until 7pm After 7:00 pm call Elink  [phone]

## 2020-07-31 NOTE — Progress Notes (Signed)
SLP Cancellation Note  Patient Details Name: BOONE GEAR MRN: [id number] DOB: Dec 14, 1960   Cancelled treatment:       Reason Eval/Treat Not Completed: Patient not medically ready. Pt remains on vent with tentative plans for trach. Will s/o at this time - please reorder when ready.     Osie Bond., M.A. [facility] Pager [phone] Office [phone]  07/31/2020, 7:40 AM

## 2020-07-31 NOTE — Progress Notes (Signed)
STROKE TEAM PROGRESS NOTE   INTERVAL HISTORY No family is at the bedside. Pt remains intubated for respiratory failure and heart failure., .patient has been unable to be weaned and developed respiratory distress.  . No plans for TEE and cardiology signing off.  No neurological changes.  Remains unresponsive with eyes open and tonic eye deviation with some spontaneous lower extremity movements but no extremity movements. OBJECTIVE Vitals:   07/31/20 1136 07/31/20 1200 07/31/20 1230 07/31/20 1300  BP:  130/80 114/66 103/69  Pulse:  85 72 66  Resp:  (!) 22 19 (!) 22  Temp: 98.8 F (37.1 C)     TempSrc: Axillary     SpO2:  98% 97% 98%  Weight:      Height:       CBC:  Recent Labs  Lab 07/29/20 0151 07/30/20 0101 07/31/20 0422  WBC 10.4 11.6* 11.8*  NEUTROABS 6.4  --   --   HGB 11.4* 12.4* 11.8*  HCT 34.3* 37.1* 35.8*  MCV 92.2 90.9 91.3  PLT 349 353 242   Basic Metabolic Panel:  Recent Labs  Lab 07/26/20 0509 07/26/20 0626 07/26/20 1621 07/26/20 1940 07/27/20 0119 07/30/20 0101 07/31/20 0422  NA  --    < >  --   --    < > 139 138  K  --    < >  --   --    < > 4.0 4.5  CL  --    < >  --   --    < > 104 102  CO2  --    < >  --   --    < > 25 27  GLUCOSE  --    < >  --   --    < > 157* 116*  BUN  --    < >  --   --    < > 26* 27*  CREATININE  --    < >  --   --    < > 0.92 0.86  CALCIUM  --    < >  --   --    < > 9.0 9.0  MG 2.2  --  2.0  --   --   --   --   PHOS 1.6*  --  2.9 3.3  --   --   --    < > = values in this interval not displayed.    Lipid Panel:     Component Value Date/Time   CHOL 116 07/25/2020 0126   TRIG 71 07/31/2020 0422   HDL 36 (L) 07/25/2020 0126   CHOLHDL 3.2 07/25/2020 0126   VLDL 20 07/25/2020 0126   LDLCALC 60 07/25/2020 0126   HgbA1c:  Lab Results  Component Value Date   HGBA1C 6.1 (H) 07/25/2020   Urine Drug Screen:     Component Value Date/Time   LABOPIA NONE DETECTED 08-10-2020 1025   COCAINSCRNUR POSITIVE (A) Aug 10, 2020  1025   LABBENZ NONE DETECTED August 10, 2020 1025   AMPHETMU NONE DETECTED 2020/08/10 1025   THCU POSITIVE (A) 08-10-2020 1025   LABBARB NONE DETECTED August 10, 2020 1025    Alcohol Level     Component Value Date/Time   ETH <10 2020-08-10 0939    IMAGING CT HEAD WITHOUT CONTRAST 08/10/2020 TECHNIQUE: Contiguous axial images were obtained from the base of the skull through the vertex without intravenous contrast. COMPARISON:  None. FINDINGS: Brain: Patchy bilateral fairly symmetric white matter hypodensity in both hemispheres.  Confluent asymmetric mixed hypo and hyperdensity in the superior left occipital pole, series 3, image 14 and coronal image 63. No similar cortical hypodensity elsewhere. Deep gray nuclei and posterior fossa gray-white differentiation remains within normal limits. No associated intracranial mass effect. No midline shift. No ventriculomegaly. No extra-axial hemorrhage identified. Vascular: Mild Calcified atherosclerosis at the skull base. Suggestion of fetal type left PCA origin. Questionable hyperdense left PCA P2 are P3 segment on series 3, image 15. No other suspicious intracranial vascular hyperdensity. Skull: Negative. Sinuses/Orbits: Right nasal airway in place. Small volume retained secretions in the nasopharynx. Mild mucosal thickening and bubbly opacity in the left ethmoid and maxillary sinuses. Tympanic cavities and mastoids are clear. Other: Visualized orbits and scalp soft tissues are within normal limits. IMPRESSION: 1. Abnormal mixed hypo- and hyperdensity in the superior left occipital lobe encompassing roughly 3 cm. [name] this is an acute or subacute Left PCA territory infarct with petechial hemorrhage (questionable hyperdense left PCA) - but recommend Head MRI (without contrast may suffice) to confirm. 2. No significant intracranial mass effect. Superimposed age advanced bilateral white matter changes, most commonly due to chronic small vessel disease. 3.  Right nasal airway in place. Mild left paranasal sinus inflammation.  MRI brain 07/26/2020 No acute infarction affects the brainstem or cerebellum. 3 cm acute infarction at the left parietooccipital junction on the left. Watershed pattern infarctions extending front to back affecting both hemispheres at the watershed regions. Cortical and white matter involvement as is typical. Mild chronic small-vessel ischemic change of the cerebral hemispheric white matter otherwise is suspected. No evidence of mass, frank hematoma, hydrocephalus or extra-axial collection. Minor petechial blood products present in the left parietooccipital junction region infarction. Vascular: Major vessels at the base of the brain show flow. Skull and upper cervical spine: Negative Sinuses/Orbits: Mucosal inflammatory changes of the paranasal sinuses. Orbits are negative.  MRA HEAD FINDINGS 07/14/2020 Both internal carotid arteries are widely patent through the skull base and siphon regions. The anterior and middle cerebral vessels are patent. 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 Fetal origin of the left PCA from the anterior circulation.  Dominant left vertebral artery widely patent to the basilar. Non dominant right vertebral artery shows atherosclerotic narrowing and irregularity with does give flow to the basilar. The basilar artery shows atherosclerotic irregularity but no stenosis. Superior cerebellar and posterior cerebral arteries show flow. As noted above, left PCA arises from the anterior circulation.  IMPRESSION: 1. Watershed pattern infarctions extending front to back affecting both hemispheres at the watershed regions. 3 cm acute infarction at the left parietooccipital junction was visible at CT mild petechial blood products in the left parietooccipital infarction region. No evidence of frank hematoma or mass effect. Findings most likely secondary to global hypoperfusion event. 2.  Background pattern of mild chronic small-vessel ischemic change of the cerebral hemispheric white matter elsewhere. 3. MR angiography does not show any large or medium vessel occlusion. There is atherosclerotic irregularity of the non dominant right vertebral artery and the basilar artery but no significant stenosis. 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 Fetal origin of the left PCA from the anterior circulation.   No results found. Transthoracic Echocardiogram  1. Left ventricular ejection fraction, by estimation, is 30 to 35%. The  left ventricle has moderately decreased function. The left ventricle  demonstrates global hypokinesis. Left ventricular diastolic parameters are  consistent with Grade I diastolic  dysfunction (impaired relaxation).  2. Right ventricular systolic function is normal. The right  ventricular  size is normal.  3. The mitral valve is normal in structure. No evidence of mitral valve  regurgitation. No evidence of mitral stenosis.  4. The aortic valve is normal in structure. Aortic valve regurgitation is  not visualized. No aortic stenosis is present.  5. The inferior vena cava is normal in size with greater than 50%  respiratory variability, suggesting right atrial pressure of 3 mmHg.   Bilateral Carotid Dopplers  Right Carotid: Velocities in the right ICA are consistent with a 1-39%  stenosis. The ECA appears occluded.  Left Carotid: Velocities in the left ICA are consistent with a 1-39%  stenosis.  Vertebrals: Bilateral vertebral arteries demonstrate antegrade flow.   ECG - ST rate 112 BPM. (See cardiology reading for complete details)  PHYSICAL EXAM - Relatively unchanged compared to yesterday.  Temp:  [98.2 F (36.8 C)-99.6 F (37.6 C)] 98.8 F (37.1 C) (03/02 1136) Pulse Rate:  [58-104] 66 (03/02 1300) Resp:  [14-32] 22 (03/02 1300) BP: (100-186)/(66-95) 103/69 (03/02 1300) SpO2:  [96 %-100 %] 98 % (03/02  1300) FiO2 (%):  [40 %] 40 % (03/02 1112) Weight:  [81.6 kg] 81.6 kg (03/02 0500)  General - Well nourished, well developed middle-aged Caucasian male, appears older than listed age, intubated on sedation.  Ophthalmologic - fundi not visualized.  Cardiovascular - Regular rhythm and rate at time of exam.  Neuro - intubated on Precedex, eyes are now open with   tonic upgaze deviation in mid position, not blinking to visual threat, doll's eyes present  , not tracking, PERRL. Corneal reflex weakly present bilaterally, gag and cough present. Breathing over the vent. Facial symmetry not able to test due to ET tube. Tongue protrusion not cooperative. Moves bilateral lower extremities spontaneously and semi purposefully and trace bilateral upper extremities however strength is significantly diminished in the uppers. DTR diminished and bilateral babinski. Sensation, coordination and gait not tested.    ASSESSMENT/PLAN Benjamin Burke is a 60 y.o. male with history of cocaine abuse, chronic back pain, hypertension, COPD and tobacco abuse, brought into the emergency room after he was  found down by family.  He did not receive IV t-PA due to late presentation (>4.5 hours from time of onset). Patient was agitated with "upward gaze"overnight 2/25-26/2022 during transition to precedex from propofol and Neuro consulted overnight and EEG ordered which did not show seizure or epileptiform discharges. Degree of encephalopathy is out of proportion to what is seen on imaging however the patient is sedated. Bilateral Babinski may be due to bilateral strokes however encephalopathy may be a confounder. Would prefer not to make too many concurrent medication changes which may confound neurologic exam.   Stroke: b/l MCA/ACA, MCA/PCA watershed and b/l MCA punctate infarcts, findings concerning global hypoperfusion event in the setting of  Suspected sepsis and AKI. 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 [name] this is an acute or subacute Left PCA territory infarct with petechial hemorrhage (questionable hyperdense left PCA)   MRI head - Watershed pattern infarctions extending front to back affecting both hemispheres at the watershed regions. 3 cm acute infarction at the left parietooccipital junction was visible at CT mild petechial blood products in the left parietooccipital infarction region.  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 "  MRA head -  unremarkable, nondominant right VA with distal occlusion  Carotid Doppler - unremarkable  2D Echo - EF 30-35% but no vegetation  Recommend TEE to rule out LV thrombus or endocarditis  Lacey Jensen Virus 2 - negative  LDL - 60  HgbA1c - 6.1  UDS - Cocaine and THCU  VTE prophylaxis - heparin IV  aspirin 81 mg daily prior to admission, now on No antithrombotic given concerns of endocarditis  Ongoing aggressive stroke risk factor management  Therapy recommendations:  pending  Disposition:  Pending  Sepsis ruled out Fever  Leukocytosis  Tmax 100.8->101.3  Leukocytosis - WBC's - 15.6->20.8->16.7->14.9  Blood cultures NGTD  UA neg  CXR Persistent bilateral interstitial prominence, pneumonitis cannot be excluded.  CSF WBC 2, RBC 7, protein 31, glucose 82 - no CNS infection  On Vancomycin + Cefepime -> Rocephin  troponinemia    Trop 1605->6484->6476  EF 30-35%, no vegetation  CCM on board  Cardiology on board concerning for demand ischemia  Recommend TEE to rule out LV thrombus or endocarditis  Respiratory failure  Intubated on vent  CCM on board  Not extubate candidate now given mental status  AKI  creatinine - 2.70->2.48->1.68->1.03  Normalized  BMP monitoring  On IVF  Hypertension  Home BP  meds: none   Current BP meds: none   Stable . Avoid low BP . Long-term BP goal normotensive  Hyperlipidemia  Home Lipid lowering medication: Lipitor 20 mg daily  LDL 60, goal < 70  Hold off statin due to elevated LFTs  AST/ALT 74/55 -> 45/60 consider statin at discharge.  Cocaine abuse   Hx of cocaine use  UDS positive for cocaine  Cessation education will be provided  Tobacco abuse  Current smoker  Smoking cessation counseling will be provided  Other Stroke Risk Factors  Family hx stroke (mother)   Substance abuse - THC positive, cessation education will be provided  Other Active Problems, Findings, Recommendations and/or Plan  Code status - Full code   Elevate LFTs  Hospital day # 7 Patient remains heavily sedated for comfort and management of his respiratory failure.   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  He is unlikely to survive without prolonged ventilatory support, tracheostomy, PEG tube and nursing home care...  . Critical care team planning one-way extubation after discussion with family.  Stroke team will sign off.  Kindly call for questions.  This patient is critically ill due to sepsis, septic shock, stroke, elevated troponin, AKI, and at significant risk of neurological worsening, death form septic shock, endocarditis, recurrent stroke, heart failure, seizure. This patient's care requires constant monitoring of vital signs, hemodynamics, respiratory and cardiac monitoring, review of multiple databases, neurological assessment, discussion with family, other specialists and medical decision making of high complexity. I spent 30 minutes of neurocritical care time in the care of this patient. I have discussed with Dr. Kathalene Frames critical care medicine  Antony Contras, MD 07/31/2020 1:23 PM  To contact Stroke Continuity provider, please refer to [email or web address] After hours,  contact General Neurology

## 2020-07-31 NOTE — Progress Notes (Signed)
Daily Progress Note   Patient Name: Benjamin Burke       Date: 07/31/2020 DOB: [birth year]  Age: 60 y.o. MRN#: [id number] Attending Physician: Juanito Doom, MD Primary Care Physician: Vicenta Aly, FNP Admit Date: 07/26/2020  Reason for Consultation/Follow-up: Establishing goals of care  Subjective:  Spoke with patient's sister, Benjamin Burke for follow-up. Family is now all in agreement that trach/PEG would not be in line with preferred goals of care and advanced care planning.  They are in agreement with extubation and comfort measures only.  There are family members from out of town that wish to come pay respects. Family requesting time for them to visit and be present for extubation.   Review of Systems  Unable to perform ROS: Intubated    Length of Stay: 7  Current Medications: Scheduled Meds:  . atorvastatin  80 mg Per Tube Daily  . carvedilol  12.5 mg Oral BID WC  . chlorhexidine gluconate (MEDLINE KIT)  15 mL Mouth Rinse BID  . Chlorhexidine Gluconate Cloth  6 each Topical Daily  . docusate  100 mg Per Tube BID  . etomidate  40 mg Intravenous Once  . famotidine  20 mg Per Tube BID  . fentaNYL (SUBLIMAZE) injection  200 mcg Intravenous Once  . heparin injection (subcutaneous)  5,000 Units Subcutaneous Q8H  . heparin injection (subcutaneous)  5,000 Units Subcutaneous Q8H  . losartan  25 mg Per Tube Daily  . mouth rinse  15 mL Mouth Rinse 10 times per day  . midazolam  5 mg Intravenous Once  . polyethylene glycol  17 g Per Tube Daily  . propofol  500 mg Intravenous Once  . spironolactone  25 mg Oral Daily  . vecuronium  10 mg Intravenous Once    Continuous Infusions: . dexmedetomidine (PRECEDEX) IV infusion 1.2 mcg/kg/hr (07/31/20 1300)  . feeding supplement (VITAL AF  1.2 CAL) 1,000 mL (07/31/20 0629)  . fentaNYL infusion INTRAVENOUS 200 mcg/hr (07/31/20 1300)    PRN Meds: acetaminophen (TYLENOL) oral liquid 160 mg/5 mL, fentaNYL (SUBLIMAZE) injection  Physical Exam Vitals and nursing note reviewed.  Constitutional:      Appearance: He is ill-appearing and diaphoretic.     Comments: Brow furrowed, appears uncomfortable  Cardiovascular:     Comments: BUE edema Skin:    Coloration: Skin is  pale.  Neurological:     Comments: BUE flaccid, nonpurposeful movements of BLE, no eye opening to voice             Vital Signs: BP (!) 150/88   Pulse 71   Temp 98.8 F (37.1 C) (Axillary)   Resp 19   Ht 5' 7.01" (1.702 m)   Wt 81.6 kg   SpO2 100%   BMI 28.17 kg/m  SpO2: SpO2: 100 % O2 Device: O2 Device: Ventilator O2 Flow Rate:    Intake/output summary:   Intake/Output Summary (Last 24 hours) at 07/31/2020 1354 Last data filed at 07/31/2020 1300 Gross per 24 hour  Intake 2674.16 ml  Output 1225 ml  Net 1449.16 ml   LBM: Last BM Date: 07/31/20 Baseline Weight: Weight: 77.7 kg Most recent weight: Weight: 81.6 kg       Palliative Assessment/Data: PPS: 10%      Patient Active Problem List   Diagnosis Date Noted  . Acute respiratory failure with hypoxemia ([place])   . Acute systolic CHF (congestive heart failure) ([place])   . Elevated troponin   . Cerebrovascular accident (CVA) ([place]) 07/25/2020  . Sepsis ([place])   . AKI (acute kidney injury) ([place])   . Aspiration into lower respiratory tract   . AMS (altered mental status) 07/13/2020  . Bleb, lung ([place]) 02/27/2018  . Pneumothorax 02/26/2018  . Hypertensive urgency 02/26/2018  . Pneumothorax on right 02/26/2018  . Cocaine abuse ([place]) 08/09/2016  . Right spontaneous pneumothorax 08/07/2016  . Bronchitis 08/07/2016  . Nicotine dependence, cigarettes, uncomplicated 90/30/0923  . Radiculopathy of lumbosacral region 09/12/2013  . Chronic bilateral low back pain without sciatica 09/12/2013  .  COPD (chronic obstructive pulmonary disease) ([place]) 01/27/2013  . Hyperglycemia 01/24/2013  . Essential hypertension 11/21/2012  . Hyperlipidemia with target LDL less than 100 11/21/2012  . Poor dentition 09/27/2012  . Tobacco abuse 03/24/2012    Palliative Care Assessment & Plan   Patient Profile: 60 y.o. male  with past medical history of COPD, cocaine abuse, HTN, chronic back pain,   admitted on 07/12/2020 after being found down in his home, unresponsive, having vomited. Workup reveals sepsis, aspiration pneumonia, watershed infarcts, anoxic brain injury, systolic heart failure with EF 30-35%. He has vent dependent respiratory failure. If sedation is lightened he does not tolerate becoming agitated with increased work of breathing, increased heart rate, not following commands and therefore has not been able to wean. Palliative medicine consulted for goals of care discussion and decision regarding proceeding with trach/peg.   Assessment/Recommendations/Plan   Plan for extubation to comfort measures only on Saturday- have discussed this with Dr. Lake Bells and Solmon Ice- patient's nurse  Patient's niece and son plan to visit today   Code Status:  DNR  Prognosis:   Hours - Days- once extubated and transitioned to comfort measures only  Discharge Planning:  Anticipated Hospital Death  Care plan was discussed with patient's sister- Benjamin Burke and care team.   Thank you for allowing the Palliative Medicine Team to assist in the care of this patient.   Total time: 42 minutes Greater than 50%  of this time was spent counseling and coordinating care related to the above assessment and plan.  Mariana Kaufman, AGNP-C Palliative Medicine   Please contact Palliative Medicine Team phone at [phone] for questions and concerns.

## 2020-08-01 DIAGNOSIS — J9601 Acute respiratory failure with hypoxia: Secondary | ICD-10-CM | POA: Diagnosis not present

## 2020-08-01 DIAGNOSIS — Z7189 Other specified counseling: Secondary | ICD-10-CM | POA: Diagnosis not present

## 2020-08-01 DIAGNOSIS — I5021 Acute systolic (congestive) heart failure: Secondary | ICD-10-CM | POA: Diagnosis not present

## 2020-08-01 DIAGNOSIS — N179 Acute kidney failure, unspecified: Secondary | ICD-10-CM | POA: Diagnosis not present

## 2020-08-01 LAB — GLUCOSE, CAPILLARY
Glucose-Capillary: 116 mg/dL — ABNORMAL HIGH (ref 70–99)
Glucose-Capillary: 98 mg/dL (ref 70–99)
Glucose-Capillary: 121 mg/dL — ABNORMAL HIGH (ref 70–99)
Glucose-Capillary: 114 mg/dL — ABNORMAL HIGH (ref 70–99)

## 2020-08-01 MED ORDER — CARVEDILOL 12.5 MG PO TABS: 6.2500 mg | ORAL_TABLET | Freq: Two times a day (BID) | ORAL | Status: DC

## 2020-08-01 MED ORDER — SODIUM CHLORIDE 0.9 % IV SOLN
0.0000 mg/h | INTRAVENOUS | Status: DC
  Administered 2020-08-02: 30 mg/h via INTRAVENOUS
  Filled 2020-08-01: qty 10
  Filled 2020-08-01: qty 20

## 2020-08-01 MED ORDER — GLYCOPYRROLATE 0.2 MG/ML IJ SOLN: 0.2000 mg | INTRAMUSCULAR | Status: DC | PRN

## 2020-08-01 MED ORDER — CHLORHEXIDINE GLUCONATE 0.12 % MT SOLN
OROMUCOSAL | Status: AC
  Administered 2020-08-01: 15 mL via OROMUCOSAL
  Filled 2020-08-01: qty 15

## 2020-08-01 MED ORDER — MORPHINE 100MG IN NS 100ML (1MG/ML) PREMIX INFUSION
0.0000 mg/h | INTRAVENOUS | Status: DC
  Administered 2020-08-01: 5 mg/h via INTRAVENOUS
  Administered 2020-08-01: 20 mg/h via INTRAVENOUS
  Filled 2020-08-01 (×2): qty 100

## 2020-08-01 MED ORDER — ACETAMINOPHEN 650 MG RE SUPP: 650.0000 mg | Freq: Four times a day (QID) | RECTAL | Status: DC | PRN

## 2020-08-01 MED ORDER — DEXTROSE 5 % IV SOLN: INTRAVENOUS | Status: DC

## 2020-08-01 MED ORDER — DIPHENHYDRAMINE HCL 50 MG/ML IJ SOLN: 25.0000 mg | INTRAMUSCULAR | Status: DC | PRN

## 2020-08-01 MED ORDER — LORAZEPAM 2 MG/ML IJ SOLN
2.0000 mg | INTRAMUSCULAR | Status: DC | PRN
Start: 2020-08-01 — End: 2020-08-02
  Administered 2020-08-01: 4 mg via INTRAVENOUS
  Filled 2020-08-01: qty 2

## 2020-08-01 MED ORDER — GLYCOPYRROLATE 0.2 MG/ML IJ SOLN
0.2000 mg | INTRAMUSCULAR | Status: DC | PRN
  Administered 2020-08-01: 0.2 mg via INTRAVENOUS
  Filled 2020-08-01: qty 1

## 2020-08-01 MED ORDER — MORPHINE BOLUS VIA INFUSION
5.0000 mg | INTRAVENOUS | Status: DC | PRN
Start: 2020-08-01 — End: 2020-08-02
  Filled 2020-08-01: qty 5

## 2020-08-01 MED ORDER — ACETAMINOPHEN 325 MG PO TABS: 650.0000 mg | ORAL_TABLET | Freq: Four times a day (QID) | ORAL | Status: DC | PRN

## 2020-08-01 MED ORDER — GLYCOPYRROLATE 1 MG PO TABS: 1.0000 mg | ORAL_TABLET | ORAL | Status: DC | PRN

## 2020-08-01 MED ORDER — POLYVINYL ALCOHOL 1.4 % OP SOLN
1.0000 [drp] | Freq: Four times a day (QID) | OPHTHALMIC | Status: DC | PRN
  Filled 2020-08-01: qty 15

## 2020-08-01 MED ORDER — MORPHINE SULFATE (PF) 2 MG/ML IV SOLN: 2.0000 mg | INTRAVENOUS | Status: DC | PRN

## 2020-08-01 NOTE — Progress Notes (Signed)
[facility] Progress Note Patient Name: Benjamin Burke DOB: Feb 25, 1961 MRN: [id number]   Date of Service  08/01/2020  HPI/Events of Note  Patient on comfort care. Patient not comfortable on Morphine IV infusion at 20 mg/hour.   eICU Interventions  Will increase ceiling on Morphine IV infusion to 30 mg/hour. Titrate to patient comfort.      Intervention Category Major Interventions: End of life / care limitation discussion  Lysle Dingwall 08/01/2020, 9:04 PM

## 2020-08-01 NOTE — Progress Notes (Signed)
Daily Progress Note   Patient Name: Benjamin Burke       Date: 08/01/2020 DOB: 28-Sep-1960  Age: 60 y.o. MRN#: [id number] Attending Physician: Juanito Doom, MD Primary Care Physician: Vicenta Aly, FNP Admit Date: 07/05/2020  Reason for Consultation/Follow-up: Establishing goals of care and Terminal Care  Subjective: Met with patient's sisters, nieces, and brother. Discussed concerns that patient was looking more uncomfortable, and the risk was increasing of his dying suddenly, uncomfortably, and alone. Family had previously expressed they desired comfort and for him not to be alone as he passed into end of life.  Recommendation were made for patient's family to visit and for extubation and transition to comfort measures to be done sooner rather than later.    ROS  Length of Stay: 8  Current Medications: Scheduled Meds:  . atorvastatin  80 mg Per Tube Daily  . carvedilol  12.5 mg Oral BID WC  . chlorhexidine gluconate (MEDLINE KIT)  15 mL Mouth Rinse BID  . Chlorhexidine Gluconate Cloth  6 each Topical Daily  . docusate  100 mg Per Tube BID  . famotidine  20 mg Per Tube BID  . heparin injection (subcutaneous)  5,000 Units Subcutaneous Q8H  . losartan  25 mg Per Tube Daily  . mouth rinse  15 mL Mouth Rinse 10 times per day  . polyethylene glycol  17 g Per Tube Daily  . spironolactone  25 mg Oral Daily  . vecuronium  10 mg Intravenous Once    Continuous Infusions: . dexmedetomidine (PRECEDEX) IV infusion 1.2 mcg/kg/hr (08/01/20 1411)  . feeding supplement (VITAL AF 1.2 CAL) 1,000 mL (07/31/20 2211)  . fentaNYL infusion INTRAVENOUS 300 mcg/hr (08/01/20 1400)    PRN Meds: acetaminophen (TYLENOL) oral liquid 160 mg/5 mL, fentaNYL (SUBLIMAZE) injection  Physical Exam           Vital Signs: BP (!) 141/81   Pulse 73   Temp 99.6 F (37.6 C) (Axillary)   Resp 16   Ht 5' 7.01" (1.702 m)   Wt 81.5 kg   SpO2 96%   BMI 28.13 kg/m  SpO2: SpO2: 96 % O2 Device: O2 Device: Ventilator O2 Flow Rate:    Intake/output summary:   Intake/Output Summary (Last 24 hours) at 08/01/2020 1513 Last data filed at 08/01/2020 1400 Gross per 24 hour  Intake 2425.18 ml  Output 1565 ml  Net 860.18 ml   LBM: Last BM Date: 08/01/20 Baseline Weight: Weight: 77.7 kg Most recent weight: Weight: 81.5 kg       Palliative Assessment/Data: PPS: 10%       Patient Active Problem List   Diagnosis Date Noted  . Acute respiratory failure with hypoxemia ([place])   . Acute systolic CHF (congestive heart failure) ([place])   . Elevated troponin   . Cerebrovascular accident (CVA) ([place]) 07/25/2020  . Sepsis ([place])   . AKI (acute kidney injury) ([place])   . Aspiration into lower respiratory tract   . AMS (altered mental status) 07/18/2020  . Bleb, lung ([place]) 02/27/2018  . Pneumothorax 02/26/2018  . Hypertensive urgency 02/26/2018  . Pneumothorax on right 02/26/2018  . Cocaine abuse ([place]) 08/09/2016  . Right spontaneous pneumothorax 08/07/2016  . Bronchitis 08/07/2016  . Nicotine dependence, cigarettes, uncomplicated 41/93/7902  . Radiculopathy of lumbosacral region 09/12/2013  . Chronic bilateral low back pain without sciatica 09/12/2013  . COPD (chronic obstructive pulmonary disease) ([place]) 01/27/2013  . Hyperglycemia 01/24/2013  . Essential hypertension 11/21/2012  . Hyperlipidemia with target LDL less than 100 11/21/2012  . Poor dentition 09/27/2012  . Tobacco abuse 03/24/2012    Palliative Care Assessment & Plan   Patient Profile: 60 y.o.malewith past medical history of COPD, cocaine abuse, HTN, chronic back pain,admitted on 2/23/2022after being found down in his home, unresponsive, having vomited. Workup reveals sepsis, aspiration pneumonia, watershed infarcts, anoxic brain  injury, systolic heart failure with EF 30-35%. He has vent dependent respiratory failure. If sedation is lightened he does not tolerate becoming agitated with increased work of breathing, increased heart rate, not following commands and therefore has not been able to wean. Palliative medicine consulted for goals of care discussion and decision regarding proceeding with trach/peg.  Assessment/Recommendations/Plan   Plan for extubation to comfort measures- family in process of calling in family members for visitation- they will notify nursing when ready for extubation  Family would like to discuss with Dr. Lake Bells as well before extubation  PMT will continue to follow for symptom management and or/ disposition  Goals of Care and Additional Recommendations:  Limitations on Scope of Treatment: Full Comfort Care  Code Status:  DNR  Prognosis:   Hours - Days  Discharge Planning:  Anticipated Hospital Death  Care plan was discussed with patient's family and care team.   Thank you for allowing the Palliative Medicine Team to assist in the care of this patient.   Total time: 38 minutes Greater than 50%  of this time was spent counseling and coordinating care related to the above assessment and plan.  Mariana Kaufman, AGNP-C Palliative Medicine   Please contact Palliative Medicine Team phone at [phone] for questions and concerns.

## 2020-08-01 NOTE — Progress Notes (Signed)
[facility]   I met with the patient's family this afternoon to discuss goals of care for Benjamin Burke.  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  We discussed this with his family today and they agree that we should focus on his comfort.  We will move forward with withdrawal of care.  Palliative care assistance appreciated  Roselie Awkward, MD [facility] Pager: [phone] Cell: [phone] If no response, please call [phone] until 7pm After 7:00 pm call Elink  [phone]

## 2020-08-01 NOTE — Progress Notes (Signed)
RT note- Patient was extubated for comfort care per family and patients wishes.

## 2020-08-01 NOTE — Progress Notes (Signed)
NAME:  Benjamin Burke, MRN:  [id number], DOB:  [birth year], LOS: 8 ADMISSION DATE:  07/11/2020, CONSULTATION DATE:  2/23 REFERRING MD:  EDP, CHIEF COMPLAINT:  Found down   Brief History:  60 y/o male admitted after found nearly unresponsive by family, had fever, had been using cocaine the night before.  Past Medical History:  Chronic back pain Cocaine abuse COPD Hypertension Poor dentition History of spontaneous pneumothorax Tobacco abuse  Significant Hospital Events:    Consults:  CCM Neurology palliative  Procedures:  2/23 ETT>   Significant Diagnostic Tests:  CT head 2/23-abnormal mixed hypo and hyper densities in the superior left occipital lobe encompassing roughly 3 cm.  [name] an acute or subacute left PCA territory infarct with petechial hemorrhage but recommend had an MRI.  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 3 cm acute infarction at the left parietooccipital junction was visible at CT mild petechial blood products in the left parietooccipital infarction region. No evidence of frank hematoma or mass effect. Findings most likely secondary to global hypoperfusion event.  2/24 TTE LVEF 09-32%, RV systolic function normal, valves OK  Micro Data:  2/23 2023 blood culture x2>> no growth 1 day 07/24/2021 sputum culture 07/24/2021 urine culture  Antimicrobials:  07/29/2020 vancomycin x1 07/22/2020 cefepime x1 07/29/2020 Flagyl x1 2/24 ceftriaxone >   Interim History / Subjective:    Family wants to withdraw care on Saturday   Objective   Blood pressure (!) 196/128, pulse (!) 101, temperature 99 F (37.2 C), temperature source Axillary, resp. rate (!) 28, height 5' 7.01" (1.702 m), weight 81.5 kg, SpO2 100 %.    Vent Mode: PRVC FiO2 (%):  [40 %] 40 % Set Rate:  [22 bmp] 22 bmp Vt Set:  [530 mL] 530 mL PEEP:  [5 cmH20] 5 cmH20 Plateau Pressure:  [14 cmH20-18 cmH20] 16 cmH20    Intake/Output Summary (Last 24 hours) at 08/01/2020 6712 Last data filed at 08/01/2020 0800 Gross per 24 hour  Intake 2422.68 ml  Output 1310 ml  Net 1112.68 ml   Filed Weights   07/30/20 0458 07/31/20 0500 08/01/20 0500  Weight: 83.2 kg 81.6 kg 81.5 kg    Examination:  General:  In bed on vent HENT: NCAT ETT in place PULM: CTA B, vent supported breathing CV: RRR, no mgr GI: BS+, soft, nontender MSK: normal bulk and tone Neuro: Eyes open, upward gaze, moves legs frequently   Resolved Hospital Problem list     Assessment & Plan:  Acute metabolic encephalopathy > has some degree of consciousness, but is not making purposeful movements, chances of return to independent living is very low 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 RASS target 0  Goals of sedation: comfort/vent synchrony Family plans withdrawal of care on Saturday  Aspiration pneumonia Monitor off antibiotics  Systolic heart failure> presumably cocaine related Tele Cozaar, coreg to continue  Cocaine abuse   Acute respiratory failure with hypoxemia COPD, not in exacerbation Full mechanical vent support VAP prevention Daily WUA/SBT   Prognosis: poor  Best practice (evaluated daily)  Diet: full Pain/Anxiety/Delirium protocol (if indicated): as above VAP protocol (if indicated): yes DVT prophylaxis: sub q heparin GI prophylaxis: famotidine Glucose control: monitor Mobility: bed rest Disposition:remain in ICU  Goals of Care:  Last date of multidisciplinary goals of care discussion: 3/1 Family and staff present: see palliative care  note Summary of discussion: DNR, family plans withdrawal of care on 3/5 Follow up goals of care discussion due: n/a Code Status:DNR  Labs   CBC: Recent Labs  Lab 07/27/20 0119 07/28/20 0642 07/29/20 0151 07/30/20 0101 07/31/20 0422  WBC 11.7*  8.8 10.4 11.6* 11.8*  NEUTROABS  --   --  6.4  --   --   HGB 12.0* 12.0* 11.4* 12.4* 11.8*  HCT 36.4* 36.9* 34.3* 37.1* 35.8*  MCV 91.5 93.2 92.2 90.9 91.3  PLT 296 291 349 353 096    Basic Metabolic Panel: Recent Labs  Lab 07/25/20 1629 07/26/20 0509 07/26/20 0626 07/26/20 1621 07/26/20 1940 07/27/20 0119 07/28/20 0642 07/29/20 0151 07/30/20 0101 07/31/20 0422  NA  --   --    < >  --   --  138 141 142 139 138  K  --   --    < >  --   --  4.0 3.3* 3.9 4.0 4.5  CL  --   --    < >  --   --  106 104 102 104 102  CO2  --   --    < >  --   --  24 28 30 25 27   GLUCOSE  --   --    < >  --   --  128* 141* 119* 157* 116*  BUN  --   --    < >  --   --  17 18 26* 26* 27*  CREATININE  --   --    < >  --   --  0.87 0.77 0.91 0.92 0.86  CALCIUM  --   --    < >  --   --  8.3* 8.6* 8.9 9.0 9.0  MG 2.1 2.2  --  2.0  --   --   --   --   --   --   PHOS 1.6* 1.6*  --  2.9 3.3  --   --   --   --   --    < > = values in this interval not displayed.   GFR: Estimated Creatinine Clearance: 96.9 mL/min (by C-G formula based on SCr of 0.86 mg/dL). Recent Labs  Lab 07/28/20 0642 07/29/20 0151 07/30/20 0101 07/31/20 0422  WBC 8.8 10.4 11.6* 11.8*    Liver Function Tests: Recent Labs  Lab 07/27/20 0119 07/29/20 0151  AST 45* 51*  ALT 60* 74*  ALKPHOS 90 124  BILITOT 0.6 0.8  PROT 5.9* 5.8*  ALBUMIN 2.6* 2.2*   No results for input(s): LIPASE, AMYLASE in the last 168 hours. No results for input(s): AMMONIA in the last 168 hours.  ABG    Component Value Date/Time   PHART 7.346 (L) 07/25/2020 0610   PCO2ART 42.1 07/25/2020 0610   PO2ART 76 (L) 07/25/2020 0610   HCO3 23.0 07/25/2020 0610   TCO2 24 07/25/2020 0610   ACIDBASEDEF 3.0 (H) 07/25/2020 0610   O2SAT 94.0 07/25/2020 0610     Coagulation Profile: No results for input(s): INR, PROTIME in the last 168 hours.  Cardiac Enzymes: No results for input(s): CKTOTAL, CKMB, CKMBINDEX, TROPONINI in the last 168  hours.  HbA1C: Hgb A1c MFr Bld  Date/Time Value Ref Range Status  07/25/2020 01:26 AM 6.1 (H) 4.8 - 5.6 % Final    Comment:    (NOTE) Pre diabetes:          5.7%-6.4%  Diabetes:              >  6.4%  Glycemic control for   <7.0% adults with diabetes     CBG: Recent Labs  Lab 07/31/20 1536 07/31/20 2001 07/31/20 2334 08/01/20 0350 08/01/20 0753  GLUCAP 136* 116* 152* 116* 98     Critical care time: 31 minutes    Roselie Awkward, MD [facility] Pager: [phone] Cell: [phone] If no response, please call [phone] until 7pm After 7:00 pm call Elink  [phone]

## 2020-08-30 NOTE — Progress Notes (Signed)
Wasted 75 mL of Morphine with Rolla Plate, Therapist, sports.

## 2020-08-30 NOTE — Death Summary Note (Signed)
DEATH SUMMARY   Patient Details  Name: Benjamin Burke MRN: [id number] DOB: [birth year]  Admission/Discharge Information   Admit Date:  08-Aug-2020  Date of Death: Date of Death: 2020-08-17  Time of Death: Time of Death: 2022-09-13  Length of Stay: 08-23-22  Referring Physician: Vicenta Aly, FNP   Reason(s) for Hospitalization  Found unresponsive  Diagnoses  Preliminary cause of death:   Acute Stroke: Watershed infarct of brain Secondary Diagnoses (including complications and co-morbidities):  Active Problems:   AMS (altered mental status)   Cerebrovascular accident (CVA) ([place])   Sepsis ([place])   AKI (acute kidney injury) ([place])   Aspiration into lower respiratory tract   Acute systolic CHF (congestive heart failure) ([facility])   Elevated troponin   Acute respiratory failure with hypoxemia ([place]) Cocaine intoxication Cardiogenic shock  Brief Hospital Course (including significant findings, care, treatment, and services provided and events leading to death)  Benjamin Burke is a 60 y.o. year old male who was brought in on Aug 08, 2022 in the setting of being found unresponsive by family.  On presentation to our facility he was noted to be profoundly encephalopathic, in shock and respiratory failure.  He required mechanical ventilation and vasopressor support.  His urine drug screen was noted to be positive for cocaine and his family confirmed he had been using cocaine for years.  On presentation he was noted to be in multi-organ failure.  An MRI brain showed evidence of watershed infarct bilaterally.  An echocardiogram showed severe cardiomyopathy with acute systolic heart failure.  He was treated with mechanical ventilation and vasopressors and monitored closely.  Cardiology and neurology were consulted.  He did not wake to the point of being able to follow commands or interact with [place] in a meaningful way. He showed signs of severe anoxic brain injury.  It was felt that he likely had an acute cardiac event due  to cocaine intoxication prior to admission (arrhythmia or MI). We met with the patient's family several times and they elected to withdraw care as they felt he would not want long term treatment in a nursing home considering the low likelihood of survival.  Palliative care assisted with the goals of care conversations with family.    Pertinent Labs and Studies  Significant Diagnostic Studies DG Chest 1 View  Result Date: 07/25/2020 CLINICAL DATA:  Aspiration. EXAM: CHEST  1 VIEW COMPARISON:  08/08/2020.  CT 12/03/2019. FINDINGS: Endotracheal tube and NG tube in stable position. Heart size normal. Postsurgical changes right upper lung. COPD. Low lung volumes. Persistent bilateral interstitial prominence, pneumonitis cannot be excluded. No pleural effusion or pneumothorax. IMPRESSION: 1. Lines and tubes in stable position. 2. COPD. Low lung volumes. Persistent bilateral interstitial prominence, pneumonitis cannot be excluded. Electronically Signed   By: Marcello Moores  Register   On: 07/25/2020 05:09   CT Head Wo Contrast  Result Date: 2020/08/08 CLINICAL DATA:  60 year old male with altered mental status, poorly responsive. EXAM: CT HEAD WITHOUT CONTRAST TECHNIQUE: Contiguous axial images were obtained from the base of the skull through the vertex without intravenous contrast. COMPARISON:  None. FINDINGS: Brain: Patchy bilateral fairly symmetric white matter hypodensity in both hemispheres. Confluent asymmetric mixed hypo and hyperdensity in the superior left occipital pole, series 3, image 14 and coronal image 63. No similar cortical hypodensity elsewhere. Deep gray nuclei and posterior fossa gray-white differentiation remains within normal limits. No associated intracranial mass effect. No midline shift. No ventriculomegaly. No extra-axial hemorrhage identified. Vascular: Mild Calcified atherosclerosis at the skull base. Suggestion  of fetal type left PCA origin. Questionable hyperdense left PCA P2 are P3  segment on series 3, image 15. No other suspicious intracranial vascular hyperdensity. Skull: Negative. Sinuses/Orbits: Right nasal airway in place. Small volume retained secretions in the nasopharynx. Mild mucosal thickening and bubbly opacity in the left ethmoid and maxillary sinuses. Tympanic cavities and mastoids are clear. Other: Visualized orbits and scalp soft tissues are within normal limits. IMPRESSION: 1. Abnormal mixed hypo- and hyperdensity in the superior left occipital lobe encompassing roughly 3 cm. [name] this is an acute or subacute Left PCA territory infarct with petechial hemorrhage (questionable hyperdense left PCA) - but recommend Head MRI (without contrast may suffice) to confirm. 2. No significant intracranial mass effect. Superimposed age advanced bilateral white matter changes, most commonly due to chronic small vessel disease. 3. Right nasal airway in place. Mild left paranasal sinus inflammation. Electronically Signed   By: Genevie Ann M.D.   On: 07/25/2020 10:07   MR ANGIO HEAD WO CONTRAST  Result Date: 07/21/2020 CLINICAL DATA:  Acute stroke presentation. Altered mental status. Left PCA territory infarction by head CT. EXAM: MRI HEAD WITHOUT CONTRAST MRA HEAD WITHOUT CONTRAST TECHNIQUE: Multiplanar, multiecho pulse sequences of the brain and surrounding structures were obtained without intravenous contrast. Angiographic images of the head were obtained using MRA technique without contrast. COMPARISON:  Head CT earlier same day. FINDINGS: MRI HEAD FINDINGS Brain: No acute infarction affects the brainstem or cerebellum. 3 cm acute infarction at the left parietooccipital junction on the left. Watershed pattern infarctions extending front to back affecting both hemispheres at the watershed regions. Cortical and white matter involvement as is typical. Mild chronic small-vessel ischemic change of the cerebral hemispheric white matter otherwise is suspected. No evidence of mass, frank hematoma,  hydrocephalus or extra-axial collection. Minor petechial blood products present in the left parietooccipital junction region infarction. Vascular: Major vessels at the base of the brain show flow. Skull and upper cervical spine: Negative Sinuses/Orbits: Mucosal inflammatory changes of the paranasal sinuses. Orbits are negative. Other: None MRA HEAD FINDINGS Both internal carotid arteries are widely patent through the skull base and siphon regions. The anterior and middle cerebral vessels are patent. 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 Fetal origin of the left PCA from the anterior circulation. Dominant left vertebral artery widely patent to the basilar. Non dominant right vertebral artery shows atherosclerotic narrowing and irregularity with does give flow to the basilar. The basilar artery shows atherosclerotic irregularity but no stenosis. Superior cerebellar and posterior cerebral arteries show flow. As noted above, left PCA arises from the anterior circulation. IMPRESSION: 1. Watershed pattern infarctions extending front to back affecting both hemispheres at the watershed regions. 3 cm acute infarction at the left parietooccipital junction was visible at CT mild petechial blood products in the left parietooccipital infarction region. No evidence of frank hematoma or mass effect. Findings most likely secondary to global hypoperfusion event. 2. Background pattern of mild chronic small-vessel ischemic change of the cerebral hemispheric white matter elsewhere. 3. MR angiography does not show any large or medium vessel occlusion. There is atherosclerotic irregularity of the non dominant right vertebral artery and the basilar artery but no significant stenosis. 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 Fetal origin of the left PCA from the anterior circulation. Electronically Signed   By: Nelson Chimes M.D.   On: 07/25/2020 18:00   MR BRAIN WO CONTRAST  Result Date:  07/10/2020 CLINICAL DATA:  Acute stroke presentation. Altered mental status. Left  PCA territory infarction by head CT. EXAM: MRI HEAD WITHOUT CONTRAST MRA HEAD WITHOUT CONTRAST TECHNIQUE: Multiplanar, multiecho pulse sequences of the brain and surrounding structures were obtained without intravenous contrast. Angiographic images of the head were obtained using MRA technique without contrast. COMPARISON:  Head CT earlier same day. FINDINGS: MRI HEAD FINDINGS Brain: No acute infarction affects the brainstem or cerebellum. 3 cm acute infarction at the left parietooccipital junction on the left. Watershed pattern infarctions extending front to back affecting both hemispheres at the watershed regions. Cortical and white matter involvement as is typical. Mild chronic small-vessel ischemic change of the cerebral hemispheric white matter otherwise is suspected. No evidence of mass, frank hematoma, hydrocephalus or extra-axial collection. Minor petechial blood products present in the left parietooccipital junction region infarction. Vascular: Major vessels at the base of the brain show flow. Skull and upper cervical spine: Negative Sinuses/Orbits: Mucosal inflammatory changes of the paranasal sinuses. Orbits are negative. Other: None MRA HEAD FINDINGS Both internal carotid arteries are widely patent through the skull base and siphon regions. The anterior and middle cerebral vessels are patent. 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 Fetal origin of the left PCA from the anterior circulation. Dominant left vertebral artery widely patent to the basilar. Non dominant right vertebral artery shows atherosclerotic narrowing and irregularity with does give flow to the basilar. The basilar artery shows atherosclerotic irregularity but no stenosis. Superior cerebellar and posterior cerebral arteries show flow. As noted above, left PCA arises from the anterior circulation. IMPRESSION: 1. Watershed pattern infarctions  extending front to back affecting both hemispheres at the watershed regions. 3 cm acute infarction at the left parietooccipital junction was visible at CT mild petechial blood products in the left parietooccipital infarction region. No evidence of frank hematoma or mass effect. Findings most likely secondary to global hypoperfusion event. 2. Background pattern of mild chronic small-vessel ischemic change of the cerebral hemispheric white matter elsewhere. 3. MR angiography does not show any large or medium vessel occlusion. There is atherosclerotic irregularity of the non dominant right vertebral artery and the basilar artery but no significant stenosis. 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 Fetal origin of the left PCA from the anterior circulation. Electronically Signed   By: Nelson Chimes M.D.   On: 07/10/2020 18:00   DG Chest Port 1 View  Result Date: 07/29/2020 CLINICAL DATA:  Respiratory failure EXAM: PORTABLE CHEST 1 VIEW COMPARISON:  July 25, 2020 FINDINGS: The cardiomediastinal silhouette is unchanged in contour.ETT tip terminates 4.4 cm above the carina. Feeding tube tip terminates over the stomach. No pleural effusion. No pneumothorax. Unchanged postsurgical changes of the RIGHT apex. Mild diffuse interstitial prominence with central vascular congestion, similar in comparison to prior. Low lung volumes. Visualized abdomen is unremarkable. No acute osseous abnormality. IMPRESSION: 1.  Support apparatus as described above. 2. Persistent diffuse interstitial prominence likely reflecting pulmonary edema. Electronically Signed   By: Valentino Saxon MD   On: 07/29/2020 07:55   DG Chest Portable 1 View  Result Date: 07/17/2020 CLINICAL DATA:  Intubation EXAM: PORTABLE CHEST 1 VIEW COMPARISON:  07/19/2020 at 0948 hours FINDINGS: Interval placement of endotracheal tube distal tip terminating approximately 5.7 cm above the carina. Interval placement of enteric tube with  distal tip extending beyond the inferior margin of the film. Stable cardiomediastinal contours. Mild pulmonary vascular congestion. Hazy perihilar and bibasilar interstitial opacities, right slightly worse than left. No pleural effusion. No pneumothorax. Postsurgical changes and scarring within the right upper lobe.  IMPRESSION: 1. Satisfactory endotracheal tube and enteric tube positioning. 2. Hazy perihilar and bibasilar interstitial opacities, right slightly worse than left, could reflect edema versus infection. Electronically Signed   By: Davina Poke D.O.   On: 07/03/2020 11:35   DG Chest Port 1 View  Result Date: 07/17/2020 CLINICAL DATA:  Altered level of consciousness. EXAM: PORTABLE CHEST 1 VIEW COMPARISON:  June 21, 2018. FINDINGS: The heart size and mediastinal contours are within normal limits. No pneumothorax or pleural effusion is noted. Stable postsurgical changes and scarring are noted in right upper lobe. No acute abnormality is noted. The visualized skeletal structures are unremarkable. IMPRESSION: No active disease. Electronically Signed   By: Marijo Conception M.D.   On: 07/07/2020 10:14   EEG adult  Result Date: 07/27/2020 Lora Havens, MD     07/27/2020  4:32 PM Patient Name: Benjamin Burke MRN: [id number] Epilepsy Attending: Lora Havens Referring Physician/Provider: Dr Lesleigh Noe Date: 07/27/2020 Duration: 24.22 mins Patient history: 60yo M with ams. EEG to evaluate for seizure. Level of alertness: comatose AEDs during EEG study: None Technical aspects: This EEG study was done with scalp electrodes positioned according to the 10-20 International system of electrode placement. Electrical activity was acquired at a sampling rate of $Remov'500Hz'mSnPNs$  and reviewed with a high frequency filter of $RemoveB'70Hz'YnXczVmp$  and a low frequency filter of $RemoveB'1Hz'SoNoerEY$ . EEG data were recorded continuously and digitally stored. Description: EEG showed continuous generalized 3 to 6 Hz theta-delta slowing. Hyperventilation  and photic stimulation were not performed.   ABNORMALITY -Continuous slow, generalized IMPRESSION: This study is suggestive of moderate diffuse encephalopathy, nonspecific etiology. No seizures or epileptiform discharges were seen throughout the recording. Lora Havens   ECHOCARDIOGRAM COMPLETE  Result Date: 07/25/2020    ECHOCARDIOGRAM REPORT   Patient Name:   Benjamin Burke [facility] Date of Exam: 07/25/2020 Medical Rec #:  [id number]       Height:       67.0 in Accession #:    [id number]      Weight:       175.5 lb Date of Birth:  [birth year]        BSA:          1.913 m Patient Age:    66 years        BP:           126/91 mmHg Patient Gender: M               HR:           89 bpm. Exam Location:  Inpatient Procedure: 2D Echo, Cardiac Doppler, Color Doppler and Intracardiac            Opacification Agent Indications:    NSTEMI  History:        Patient has no prior history of Echocardiogram examinations.                 COPD; Risk Factors:Hypertension.  Sonographer:    Bernadene Person RDCS Referring Phys: [id number] Candee Furbish  Sonographer Comments: Suboptimal apical window. Image acquisition challenging due to respiratory motion and Image acquisition challenging due to COPD. IMPRESSIONS  1. Left ventricular ejection fraction, by estimation, is 30 to 35%. The left ventricle has moderately decreased function. The left ventricle demonstrates global hypokinesis. Left ventricular diastolic parameters are consistent with Grade I diastolic dysfunction (impaired relaxation).  2. Right ventricular systolic function is normal. The right ventricular size is normal.  3. The mitral valve is normal  in structure. No evidence of mitral valve regurgitation. No evidence of mitral stenosis.  4. The aortic valve is normal in structure. Aortic valve regurgitation is not visualized. No aortic stenosis is present.  5. The inferior vena cava is normal in size with greater than 50% respiratory variability, suggesting right atrial pressure of 3  mmHg. FINDINGS  Left Ventricle: Left ventricular ejection fraction, by estimation, is 30 to 35%. The left ventricle has moderately decreased function. The left ventricle demonstrates global hypokinesis. Definity contrast agent was given IV to delineate the left ventricular endocardial borders. The left ventricular internal cavity size was normal in size. There is no left ventricular hypertrophy. Left ventricular diastolic parameters are consistent with Grade I diastolic dysfunction (impaired relaxation). Right Ventricle: The right ventricular size is normal. No increase in right ventricular wall thickness. Right ventricular systolic function is normal. Left Atrium: Left atrial size was normal in size. Right Atrium: Right atrial size was normal in size. Pericardium: There is no evidence of pericardial effusion. Mitral Valve: The mitral valve is normal in structure. No evidence of mitral valve regurgitation. No evidence of mitral valve stenosis. Tricuspid Valve: The tricuspid valve is normal in structure. Tricuspid valve regurgitation is not demonstrated. No evidence of tricuspid stenosis. Aortic Valve: The aortic valve is normal in structure. Aortic valve regurgitation is not visualized. No aortic stenosis is present. Pulmonic Valve: The pulmonic valve was normal in structure. Pulmonic valve regurgitation is not visualized. No evidence of pulmonic stenosis. Aorta: The aortic root is normal in size and structure. Venous: The inferior vena cava is normal in size with greater than 50% respiratory variability, suggesting right atrial pressure of 3 mmHg. IAS/Shunts: No atrial level shunt detected by color flow Doppler.  LEFT VENTRICLE PLAX 2D LVIDd:         4.80 cm      Diastology LVIDs:         3.80 cm      LV e' medial:    5.19 cm/s LV PW:         0.60 cm      LV E/e' medial:  11.8 LV IVS:        0.70 cm      LV e' lateral:   5.34 cm/s LVOT diam:     1.90 cm      LV E/e' lateral: 11.5 LV SV:         48 LV SV Index:   25  LVOT Area:     2.84 cm  LV Volumes (MOD) LV vol d, MOD A4C: 135.0 ml LV vol s, MOD A4C: 93.1 ml LV SV MOD A4C:     135.0 ml RIGHT VENTRICLE RV S prime:     10.40 cm/s TAPSE (M-mode): 1.9 cm LEFT ATRIUM             Index       RIGHT ATRIUM           Index LA diam:        3.10 cm 1.62 cm/m  RA Area:     15.10 cm LA Vol (A2C):   26.4 ml 13.80 ml/m RA Volume:   41.50 ml  21.69 ml/m LA Vol (A4C):   25.2 ml 13.17 ml/m LA Biplane Vol: 27.5 ml 14.38 ml/m  AORTIC VALVE LVOT Vmax:   116.00 cm/s LVOT Vmean:  85.600 cm/s LVOT VTI:    0.169 m  AORTA Ao Root diam: 2.90 cm Ao Asc diam:  3.00 cm MITRAL VALVE MV  Area (PHT): 2.36 cm    SHUNTS MV Decel Time: 322 msec    Systemic VTI:  0.17 m MV E velocity: 61.30 cm/s  Systemic Diam: 1.90 cm MV A velocity: 80.60 cm/s MV E/A ratio:  0.76 Candee Furbish MD Electronically signed by Candee Furbish MD Signature Date/Time: 07/25/2020/2:06:12 PM    Final    VAS US CAROTID (at [facility] and [facility] only)  Result Date: 07/29/2020 Carotid Arterial Duplex Study Indications:       CVA. Risk Factors:      Hypertension. Comparison Study:  no prior Performing Technologist: Abram Sander RVS  Examination Guidelines: A complete evaluation includes B-mode imaging, spectral Doppler, color Doppler, and power Doppler as needed of all accessible portions of each vessel. Bilateral testing is considered an integral part of a complete examination. Limited examinations for reoccurring indications may be performed as noted.  Right Carotid Findings: +----------+-------+-------+--------+---------------------------------+--------+           PSV    EDV    StenosisPlaque Description               Comments           cm/s   cm/s                                                     +----------+-------+-------+--------+---------------------------------+--------+ CCA Prox  49     19             heterogenous                               +----------+-------+-------+--------+---------------------------------+--------+ CCA Distal32     13             heterogenous, calcific and                                                irregular                                 +----------+-------+-------+--------+---------------------------------+--------+ ICA Prox  92     35     1-39%   heterogenous, irregular and                                               calcific                                  +----------+-------+-------+--------+---------------------------------+--------+ ICA Distal91     37                                                       +----------+-------+-------+--------+---------------------------------+--------+ ECA                     Occluded                                          +----------+-------+-------+--------+---------------------------------+--------+ +----------+--------+-------+--------+-------------------+  PSV cm/sEDV cmsDescribeArm Pressure (mmHG) +----------+--------+-------+--------+-------------------+ [id number]                                         +----------+--------+-------+--------+-------------------+ +---------+--------+--+--------+--+---------+ VertebralPSV cm/s69EDV cm/s16Antegrade +---------+--------+--+--------+--+---------+  Left Carotid Findings: +----------+--------+--------+--------+-------------------------+---------+           PSV cm/sEDV cm/sStenosisPlaque Description       Comments  +----------+--------+--------+--------+-------------------------+---------+ CCA Prox  78      22              heterogenous                       +----------+--------+--------+--------+-------------------------+---------+ CCA Distal57      17              heterogenous                       +----------+--------+--------+--------+-------------------------+---------+ ICA Prox  80      30      1-39%   heterogenous and  calcificShadowing +----------+--------+--------+--------+-------------------------+---------+ ICA Distal101     48                                                 +----------+--------+--------+--------+-------------------------+---------+ ECA       91      23                                                 +----------+--------+--------+--------+-------------------------+---------+ +----------+--------+--------+--------+-------------------+           PSV cm/sEDV cm/sDescribeArm Pressure (mmHG) +----------+--------+--------+--------+-------------------+ [id number]                                          +----------+--------+--------+--------+-------------------+ +---------+--------+--+--------+--+---------+ VertebralPSV cm/s42EDV cm/s15Antegrade +---------+--------+--+--------+--+---------+   Summary: Right Carotid: Velocities in the right ICA are consistent with a 1-39% stenosis.                The ECA appears occluded. Left Carotid: Velocities in the left ICA are consistent with a 1-39% stenosis. Vertebrals: Bilateral vertebral arteries demonstrate antegrade flow. *See table(s) above for measurements and observations.  Electronically signed by Delia Heady MD on 07/29/2020 at 1:48:19 PM.    Final    DG FL GUIDED LUMBAR PUNCTURE  Result Date: 07/26/2020 CLINICAL DATA:  Altered mental status. Watershed infarction on MRI. Intubated patient. EXAM: DIAGNOSTIC LUMBAR PUNCTURE UNDER FLUOROSCOPIC GUIDANCE FLUOROSCOPY TIME:  Fluoroscopy Time:  48 seconds Radiation Exposure Index (if provided by the fluoroscopic device): 12.9 mGy Number of Acquired Spot Images: 1 PROCEDURE: Informed consent was obtained from the patient prior to the procedure, including potential complications of headache, allergy, and pain. With the patient prone, the lower back was prepped with Betadine. 1% Lidocaine was used for local anesthesia. Lumbar puncture was performed at the L3-L4 level using a 20 gauge  needle with return of clear CSF with an opening pressure of 38 cm water. Closing pressure equal approximately 15 cm water. Nine ml of CSF were obtained for laboratory studies. The patient tolerated the procedure  well and there were no apparent complications. IMPRESSION: 1. Successful lumbar puncture. 2. Opening pressure equal 38 cm water. Electronically Signed   By: Suzy Bouchard M.D.   On: 07/26/2020 11:28    Microbiology No results found for this or any previous visit (from the past 240 hour(s)).  Lab Basic Metabolic Panel: No results for input(s): NA, K, CL, CO2, GLUCOSE, BUN, CREATININE, CALCIUM, MG, PHOS in the last 168 hours. Liver Function Tests: No results for input(s): AST, ALT, ALKPHOS, BILITOT, PROT, ALBUMIN in the last 168 hours. No results for input(s): LIPASE, AMYLASE in the last 168 hours. No results for input(s): AMMONIA in the last 168 hours. CBC: No results for input(s): WBC, NEUTROABS, HGB, HCT, MCV, PLT in the last 168 hours. Cardiac Enzymes: No results for input(s): CKTOTAL, CKMB, CKMBINDEX, TROPONINI in the last 168 hours. Sepsis Labs: No results for input(s): PROCALCITON, WBC, LATICACIDVEN in the last 168 hours.  Procedures/Operations  Endotracheal tube Central venous line   Roselie Awkward 08/12/2020, 2:24 PM

## 2020-08-30 DEATH — deceased
